# Patient Record
Sex: Male | Born: 1940 | Race: White | Hispanic: No | Marital: Married | Smoking: Former smoker
Health system: Southern US, Community
[De-identification: ages and names within clinical notes are randomized; demographics above are authoritative.]

## PROBLEM LIST (undated history)

## (undated) DIAGNOSIS — I10 Essential (primary) hypertension: Secondary | ICD-10-CM

## (undated) DIAGNOSIS — F419 Anxiety disorder, unspecified: Secondary | ICD-10-CM

## (undated) DIAGNOSIS — G709 Myoneural disorder, unspecified: Secondary | ICD-10-CM

## (undated) DIAGNOSIS — E119 Type 2 diabetes mellitus without complications: Secondary | ICD-10-CM

## (undated) DIAGNOSIS — N200 Calculus of kidney: Secondary | ICD-10-CM

## (undated) DIAGNOSIS — G8929 Other chronic pain: Secondary | ICD-10-CM

## (undated) DIAGNOSIS — M1990 Unspecified osteoarthritis, unspecified site: Secondary | ICD-10-CM

## (undated) DIAGNOSIS — E785 Hyperlipidemia, unspecified: Secondary | ICD-10-CM

## (undated) DIAGNOSIS — J189 Pneumonia, unspecified organism: Secondary | ICD-10-CM

## (undated) DIAGNOSIS — N189 Chronic kidney disease, unspecified: Secondary | ICD-10-CM

## (undated) DIAGNOSIS — I2510 Atherosclerotic heart disease of native coronary artery without angina pectoris: Secondary | ICD-10-CM

## (undated) DIAGNOSIS — I209 Angina pectoris, unspecified: Secondary | ICD-10-CM

## (undated) DIAGNOSIS — R351 Nocturia: Secondary | ICD-10-CM

## (undated) DIAGNOSIS — I219 Acute myocardial infarction, unspecified: Secondary | ICD-10-CM

## (undated) DIAGNOSIS — M549 Dorsalgia, unspecified: Secondary | ICD-10-CM

## (undated) HISTORY — PX: INCISION AND DRAINAGE OF WOUND: SHX1803

## (undated) HISTORY — PX: BACK SURGERY: SHX140

## (undated) HISTORY — PX: JOINT REPLACEMENT: SHX530

## (undated) HISTORY — PX: PARTIAL NEPHRECTOMY: SHX414

## (undated) HISTORY — PX: CORONARY ANGIOPLASTY: SHX604

---

## 1969-04-11 HISTORY — PX: LUMBAR DISC SURGERY: SHX700

## 1979-04-12 HISTORY — PX: ROTATOR CUFF REPAIR: SHX139

## 2001-08-11 DIAGNOSIS — I219 Acute myocardial infarction, unspecified: Secondary | ICD-10-CM

## 2001-08-11 HISTORY — DX: Acute myocardial infarction, unspecified: I21.9

## 2002-10-21 ENCOUNTER — Encounter: Payer: Self-pay | Admitting: Orthopedic Surgery

## 2002-10-21 ENCOUNTER — Encounter: Admission: RE | Admit: 2002-10-21 | Discharge: 2002-10-21 | Payer: Self-pay | Admitting: Orthopedic Surgery

## 2002-10-24 ENCOUNTER — Ambulatory Visit (HOSPITAL_BASED_OUTPATIENT_CLINIC_OR_DEPARTMENT_OTHER): Admission: RE | Admit: 2002-10-24 | Discharge: 2002-10-24 | Payer: Self-pay | Admitting: Orthopedic Surgery

## 2003-02-20 ENCOUNTER — Ambulatory Visit (HOSPITAL_BASED_OUTPATIENT_CLINIC_OR_DEPARTMENT_OTHER): Admission: RE | Admit: 2003-02-20 | Discharge: 2003-02-20 | Payer: Self-pay | Admitting: Orthopedic Surgery

## 2003-02-20 ENCOUNTER — Encounter: Payer: Self-pay | Admitting: Orthopedic Surgery

## 2003-07-21 ENCOUNTER — Inpatient Hospital Stay (HOSPITAL_COMMUNITY): Admission: RE | Admit: 2003-07-21 | Discharge: 2003-07-24 | Payer: Self-pay | Admitting: Neurosurgery

## 2003-08-12 HISTORY — PX: TOTAL KNEE ARTHROPLASTY: SHX125

## 2003-08-12 HISTORY — PX: ANTERIOR FUSION CERVICAL SPINE: SUR626

## 2005-03-04 ENCOUNTER — Ambulatory Visit (HOSPITAL_COMMUNITY): Admission: RE | Admit: 2005-03-04 | Discharge: 2005-03-04 | Payer: Self-pay | Admitting: Orthopedic Surgery

## 2005-07-09 ENCOUNTER — Inpatient Hospital Stay (HOSPITAL_COMMUNITY): Admission: RE | Admit: 2005-07-09 | Discharge: 2005-07-13 | Payer: Self-pay | Admitting: Orthopedic Surgery

## 2006-03-04 ENCOUNTER — Ambulatory Visit: Payer: Self-pay | Admitting: Internal Medicine

## 2006-03-12 ENCOUNTER — Ambulatory Visit: Payer: Self-pay | Admitting: Internal Medicine

## 2006-03-16 ENCOUNTER — Ambulatory Visit: Payer: Self-pay | Admitting: Internal Medicine

## 2010-10-29 ENCOUNTER — Other Ambulatory Visit: Payer: Self-pay | Admitting: Neurosurgery

## 2010-10-29 DIAGNOSIS — M549 Dorsalgia, unspecified: Secondary | ICD-10-CM

## 2010-10-29 DIAGNOSIS — M542 Cervicalgia: Secondary | ICD-10-CM

## 2010-10-30 ENCOUNTER — Ambulatory Visit
Admission: RE | Admit: 2010-10-30 | Discharge: 2010-10-30 | Disposition: A | Discharge status: Home / Self Care | Payer: Medicare Other | Source: Ambulatory Visit | Attending: Neurosurgery | Admitting: Neurosurgery

## 2010-10-30 DIAGNOSIS — M549 Dorsalgia, unspecified: Secondary | ICD-10-CM

## 2010-10-30 DIAGNOSIS — M542 Cervicalgia: Secondary | ICD-10-CM

## 2010-11-05 ENCOUNTER — Other Ambulatory Visit: Payer: Self-pay | Admitting: Neurosurgery

## 2010-11-05 DIAGNOSIS — M542 Cervicalgia: Secondary | ICD-10-CM

## 2010-11-05 DIAGNOSIS — M4800 Spinal stenosis, site unspecified: Secondary | ICD-10-CM

## 2010-11-05 DIAGNOSIS — M5410 Radiculopathy, site unspecified: Secondary | ICD-10-CM

## 2010-11-06 ENCOUNTER — Ambulatory Visit
Admission: RE | Admit: 2010-11-06 | Discharge: 2010-11-06 | Disposition: A | Discharge status: Home / Self Care | Payer: Medicare Other | Source: Ambulatory Visit | Attending: Neurosurgery | Admitting: Neurosurgery

## 2010-11-06 DIAGNOSIS — M5410 Radiculopathy, site unspecified: Secondary | ICD-10-CM

## 2010-11-06 DIAGNOSIS — M4800 Spinal stenosis, site unspecified: Secondary | ICD-10-CM

## 2010-11-06 DIAGNOSIS — M542 Cervicalgia: Secondary | ICD-10-CM

## 2010-11-29 ENCOUNTER — Other Ambulatory Visit: Payer: Self-pay | Admitting: Neurosurgery

## 2010-11-29 DIAGNOSIS — M542 Cervicalgia: Secondary | ICD-10-CM

## 2010-11-29 DIAGNOSIS — M4800 Spinal stenosis, site unspecified: Secondary | ICD-10-CM

## 2010-11-29 DIAGNOSIS — M5410 Radiculopathy, site unspecified: Secondary | ICD-10-CM

## 2010-12-02 ENCOUNTER — Ambulatory Visit
Admission: RE | Admit: 2010-12-02 | Discharge: 2010-12-02 | Disposition: A | Discharge status: Home / Self Care | Payer: Medicare Other | Source: Ambulatory Visit | Attending: Neurosurgery | Admitting: Neurosurgery

## 2010-12-02 DIAGNOSIS — M542 Cervicalgia: Secondary | ICD-10-CM

## 2010-12-02 DIAGNOSIS — M4800 Spinal stenosis, site unspecified: Secondary | ICD-10-CM

## 2010-12-02 DIAGNOSIS — M5410 Radiculopathy, site unspecified: Secondary | ICD-10-CM

## 2010-12-27 NOTE — Op Note (Signed)
NAME:  Christopher [name], Christopher [name]                           ACCOUNT NO.:  [id number]   MEDICAL RECORD NO.:  [id number]                   PATIENT TYPE:  AMB   LOCATION:  DSC                                  FACILITY:  [facility]   PHYSICIAN:  Feliberto Gottron. Turner Daniels, M.D.                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  Cartilaginous loose bodies.   PROCEDURE:  Left knee partial arthroscopic medial meniscectomy, debridement  of grade 3 to grade 4 focal chondromalacia from the medial femoral condyle  and medial tibial condyle and removal of cartilaginous loose bodies x many.   SURGEON:  Feliberto Gottron. Turner Daniels, M.D.   FIRST ASSISTANT:  Skip Mayer, P.A.-C.   ANESTHESIA:  General LMA.   ESTIMATED BLOOD LOSS:  Minimal.   FLUIDS REPLACED:  800 mL of crystalloid.   DRAINS:  None.   TOURNIQUET TIME:  None.   INDICATIONS FOR PROCEDURE:  A 70 year old man with MRI proven left knee  medial meniscal tear catching popping and pain along the posterior medial  aspect of the left knee which failed conservative treatment,  antiinflammatory medicine, observation, and exercise. He was taken for  arthroscopic evaluation and treatment of his left knee.   DESCRIPTION OF PROCEDURE:  The patient identified by armband, taken to the  operating room at [facility], appropriate anesthetic monitors  were attached and general LMA anesthesia induced with the patient in a  supine position. Lateral posts applied to the table and left lower extremity  prepped and draped in the usual sterile fashion from the ankle to the mid  thigh. Using a #11 blade, standard inferomedial and inferolateral  parapatellar portals were then made allowing introduction of the arthroscope  through the inferolateral portal and the outflow through the inferomedial  portal. The trochlea and patella were in good condition, the patella had may  be grade 1 chondromalacia on the medial side, grade 3 to grade 4 focal  chondromalacia with flap tears was debrided back to a stable margin. The  medial meniscus had complex degenerative and acute tearing of the medial and  posterior horns and these were also debrided back to stable margins using  the basket, straight biters and the 3.5 gator sucker shaver. We encountered  multiple cartilaginous loose bodies, some were removed with the arthroscopic  graspers, some were removed with the outflow. The ACL and the PCL were  intact. The lateral compartment was in excellent condition and some  incidental degenerative tearing of the lateral meniscus was lightly  debrided. The gutters were clear, the scope was taken medial to the PCL  clearing the posterior compartment. The knee was then washed out with  normal  saline solution, the arthroscopic instruments removed. A dressing of  Xeroform 4 x 4 dressings, sponges, Webril and an Ace wrap applied. The  patient was then awakened and taken to the recovery room without difficulty.                                                Feliberto Gottron. Turner Daniels, M.D.    Ovid Curd  D:  10/24/2002  T:  10/24/2002  Job:  [id number]

## 2010-12-27 NOTE — Assessment & Plan Note (Signed)
[facility]                     [facility] OFFICE NOTE   KINGSTEN, ENFIELD                        MRN:          [id number]  DATE:03/04/2006                            DOB:          [birth year]    REASON FOR CONSULTATION:  Abnormal LFTs, screening colonoscopy.   ASSESSMENT:  [age]-year-old white man with obesity, diabetes, and  presumably metabolic syndrome.  He has abnormal transaminases which are most  likely due to nonalcoholic steatohepatitis or just fatty liver itself  without hepatitis.  They are one and a half to two times abnormal.  He is  also of an appropriate age for colon cancer screening and is average risk.   RECOMMENDATIONS AND PLAN:  1.  Regarding the abnormal LFTs, will check iron saturation, a ferritin, an      ANA, hepatitis B surface antigen, and hepatitis C antibody.  2.  Ultrasound of the liver.  3.  Pending that, I suspect he can start a statin.  Recent data indicates      that statins are extremely safe in liver disease and, in his case, if we      are comfortable with the idea that it is fatty liver and not other      problems it may even help his fatty liver.  He is currently on Lopid      which is okay, but if a statin is indicated, I would use it assuming      what I said above is true.  Further recommendations to follow.  4.  A screening colonoscopy will be scheduled.  Will hold his Plavix for      seven days.  We will hold his diabetic medications the morning of.  We      will continue his aspirin and Celebrex.   Risks, benefits, and indications of colonoscopy, including the risks of  coronary or other blood vessel event off of Plavix are discussed with the  patient.  He agrees to proceed.  This is scheduled for August 2.  His  ultrasound is scheduled for July 27.   HISTORY:  Pleasant 70 year old white man who was getting routine followup  and found to have these abnormal LFTs as described.   He also has total  cholesterol of 235, triglycerides 271, HDL 38, VLDL 54, LDL 153.  His  glucose was 108 at that time.  I do not know if that was fasting.  His TSH  is normal.  Bilirubin and alkaline phosphatase are normal.  He drinks about  a six-pack a month maybe.  There is no known history of liver disease in the  family.  He does have diabetes mellitus type 2 and neuropathy.  He has not  had endoscopic screening for colon cancer.  He has no signs of bleeding.  He  has some mild dysphagia related to his C-spine fusion which he has adapted  to and is clearly related to that.  There is occasional heartburn once or  twice a week at times but he had go a month without.  He uses antacids with  relief.  There is occasional  pain in the epigastric area, usually related to  eating raw vegetables.   MEDICATIONS:  1.  Plavix 75 mg daily.  2.  Toprol XL 50 mg daily.  3.  Glucotrol 10 mg daily.  4.  Avandia 4 mg daily.  5.  Metformin 500 mg daily.  6.  Accupril 40 mg daily.  7.  Neurontin 600 mg t.i.d.  8.  Baby aspirin one each day.  9.  Celebrex 200 mg each day.  10. Lopid 600 mg b.i.d.   ALLERGIES:  No known drug allergies.  He has been sensitive to Lipitor with  muscle pains and myalgias.   PAST MEDICAL HISTORY:  1.  Hypertension.  2.  Obesity.  3.  Myocardial infarction without stenting April 13, 2002, at [facility].      He has been on Plavix since.  Apparently, had two insignificant      blockages.  4.  Type 2 diabetes mellitus.  5.  Dyslipidemia.  6.  Osteoarthritis.  7.  Nephrolithiasis x 3.  8.  Left and right shoulder surgeries.  9.  C-spine fusion.  10. Neuropathy, diabetic.   FAMILY HISTORY:  Heart disease in his father and two brothers.  Diabetes in  a brother.   SOCIAL HISTORY:  He is married.  He is on disability.  He has been a Psychologist, occupational  and a Psychologist, occupational.  He fishes quite a bit now.  Alcohol as above.  No tobacco  or drugs.   REVIEW OF SYSTEMS:  See my medical  history for full details of that.   PHYSICAL EXAMINATION:  GENERAL:  Reveals a pleasant, well-developed, obese  white man.  VITAL SIGNS:  Height 5 feet 10, weight 225 pounds.  Blood pressure 140/83.  Pulse 58.  Body mass index is 32.2 kg/meter sq which is an obesity 1 class.  HEENT:  Eyes anicteric.  ENT shows upper dentures.  Missing most of his  lower teeth.  He has his lower incisors and canine teeth.  Mouth:  Posterior  pharynx free of lesions.  NECK:  Supple.  No mass or thyromegaly.  CHEST:  Clear and resonant.  HEART:  S1 and S2.  No rubs, murmurs, or gallops.  No JVD.  EXTREMITIES:  Show no peripheral edema.  ABDOMEN:  Obese, nontender without hepatosplenomegaly or mass detected.  RECTAL:  Deferred at this time.  LYMPHATIC:  No neck, supraclavicular nodes.  SKIN:  He has multiple surgical scars in the neck and the left arm area.  PSYCH:  He is alert and oriented x 3.   DATA REVIEWED:  Office notes, laboratory tests kindly sent by Ms. Andria Meuse at  [facility].   Regarding his obesity, we did discuss reducing portion sizes, eliminating  eating when not hungry, and exercise.  Weight Watchers was briefly  suggested.  Certainly, increased fiber intake would be good for diabetes.  A  1200-kilocalorie-per-day diet would be appropriate to try to lose weight.  This can be discussed further.   I appreciate the opportunity to care for this patient.                                   Iva Boop, MD, [facility]   CEG/MedQ  DD:  03/04/2006  DT:  03/04/2006  Job #:  [id number]   cc:   Justice Britain, PA-C

## 2010-12-27 NOTE — Discharge Summary (Signed)
Christopher [name], Christopher [name]                 ACCOUNT NO.:  [id number]   MEDICAL RECORD NO.:  [id number]          PATIENT TYPE:  INP   LOCATION:  5031                         FACILITY:  [facility]   PHYSICIAN:  Feliberto Gottron. Turner Daniels, M.D.   DATE OF BIRTH:  12/16/1940   DATE OF ADMISSION:  07/09/2005  DATE OF DISCHARGE:  07/13/2005                                 DISCHARGE SUMMARY   PRIMARY DIAGNOSIS:  End-stage arthritis of the left knee.   PROCEDURE WHILE IN HOSPITAL:  Left total knee arthroplasty.   HISTORY OF PRESENT ILLNESS:  The patient is a 70 year old gentleman with  known end-stage arthritis of the left knee who has failed conservative  treatment with anti-inflammatory medications, physical therapy, cortisone  injections and has bone-on-bone arthritic changes by x-ray.  The patient  desires elective total knee arthroplasty to decrease pain and increase  function.  Risks and benefits of surgery were discussed with him in advance  and all of the questions were answered.   ALLERGIES:  The patient has no known drug allergies.   MEDICATIONS AT TIME OF ADMISSION:  Accupril, Plavix, Metformin, Glucotrol,  Neurontin, Toprol and Avandia.   MEDICAL ILLNESSES:  1.  Usual childhood diseases.  2.  Adult history of diabetes, hypertension, MI in 2003, kidney stones and      DJD.   SURGICAL HISTORY:  1.  Right shoulder scoped in 2004, left shoulder in the 1990s.  2.  Left knee scoped.   SOCIAL HISTORY:  No tobacco.  Occasional ethanol.  No IV drug abuse.  He is  married.   FAMILY HISTORY:  Mother died at age 72 with a history of CVA; father died at  14 with a history of MI.   REVIEW OF SYSTEMS:  Positive for glasses, difficulty swallowing, upper  dentures.  Denies any chest pain, shortness of breath or recent illness.   PHYSICAL EXAMINATION:  VITAL SIGNS:  Temperature 97.9 degrees, pulse 88,  respirations 20, blood pressure 138/80.  GENERAL:  The patient is a 5-foot 10-inch, 220-pound male.  HEENT:   Head is normocephalic, atraumatic.  Ears and TMs are clear.  Eyes:  Pupils are equal, round and reactive to light.  Throat benign.  NECK:  Supple.  CHEST:  Clear to auscultation and percussion.  HEART:  Regular rate and rhythm.  ABDOMEN:  Soft and nontender.  EXTREMITIES:  Left knee range of motion 10-120 degrees, 5 degrees of varus  deformity, otherwise neurovascularly intact.  SKIN:  Intact.   LABORATORY AND ACCESSORY CLINICAL DATA:  X-ray shows bone-on-bone of the  medial compartment.   Preoperative labs including CBC, CMET, chest x-ray, EKG and PTT are within  normal limits with the exception of glucose of 147 and liver function tests  including AST of 62, ALT of 83 and ALP of 129.  EKG showed possible old  ASMI.   HOSPITAL COURSE:  On the day of admission, the patient was taken to [facility]  operating room, where he underwent a left total knee arthroplasty using  DePuy Sigma RP components as well as cemented #4 femur, #4 tibia  and 38-mm  patella and 10-mm spacer.  There was a small anterior wedge-type crack noted  at the proximal tibia at the edge of the base; this was affixed with a  single 4.5 fully threaded cortical screw.  Foley catheter was placed  preoperatively and a medium Hemovac was placed, double-armed, into the  wound.  The patient was placed on perioperative antibiotics.  He was placed  on postoperative Coumadin prophylaxis per pharmacy protocol.  He was also  placed on PCA pain control with  Dilaudid.  Physical therapy was begun in  the recovery room using CPM.  Postoperative day 1, the patient was awake and  alert, no vomiting.  Vital signs were stable.  He was afebrile.  Drain  output was less than 50 mL and discontinued.  Dressing was dry.  He was  neurovascularly intact.  INR 1.1.  Foley output had been 400 mL the previous  shift.  Hemoglobin 11.9, WBC 8.3.  He continued to be weightbearing as  tolerated.  Postoperative day 2, the patient was without complaint,  T-max  100, ranging to 98 degrees, hemoglobin 11.4, WBC of 10.5, CBGs running from  56-160, INR 1.3.  Dressing was dry.  Calf was soft.  Physical therapy was  continued.  It was hopeful that he would be discharged home the following  day.  Postoperative day 3, the patient's temperature max was 100.  He was  currently afebrile at the time of their exam.  The patient was complaining  of left medial malleolar pain with ambulation, otherwise doing well.  The  wound was clean and dry.  Venous Doppler was obtained to rule out DVT and  TED hose were placed bilaterally.  Postoperative day 4, the patient was  afebrile.  Vital signs were stable.  His Doppler was negative for any type  of clot and he was discharged home to the care of his family.   DISCHARGE MEDICATIONS:  Prescriptions include Percocet, Phenergan and  Coumadin.  He will restart his Plavix, Toprol, Glucotrol, Avandia,  metformin, Accupril, Neurontin and we will have home health following him  for his Coumadin labs.   FOLLOWUP:  He should follow up with Dr. Turner Daniels in 7-10 days for suture  removal.   DIET:  ADA, regular.   ACTIVITY:  Weightbearing as tolerated with a walker.      Laural Benes. Jannet Mantis.      Feliberto Gottron. Turner Daniels, M.D.  Electronically Signed   JBR/MEDQ  D:  09/15/2005  T:  09/16/2005  Job:  [id number]

## 2010-12-27 NOTE — Op Note (Signed)
NAME:  Christopher [name], Christopher [name]                           ACCOUNT NO.:  [id number]   MEDICAL RECORD NO.:  [id number]                   PATIENT TYPE:  INP   LOCATION:  3008                                 FACILITY:  [facility]   PHYSICIAN:  Hilda Lias, M.D.                DATE OF BIRTH:  [birth year]   DATE OF PROCEDURE:  07/21/2003  DATE OF DISCHARGE:                                 OPERATIVE REPORT   PREOPERATIVE DIAGNOSIS:  Cervical stenosis at 4-5, 5-6, and 6-7 with  cervical myelopathy, degenerative disk disease, status post trauma to the  cervical cord eight months ago.   POSTOPERATIVE DIAGNOSIS:  Cervical stenosis at 4-5, 5-6, and 6-7 with  cervical myelopathy, degenerative disk disease, status post trauma to the  cervical cord eight months ago.   PROCEDURE:  Anterior 4-5, 5-6, and 6-7 diskectomy, decompression of the  spinal cord, bilateral foraminotomies, interbody fusion with allograft,  plate from C4 to C7, microscope.   SURGEON:  Hilda Lias, M.D.   ASSISTANT:  Danae Orleans. Venetia Maxon, M.D.   INDICATIONS FOR PROCEDURE:  The patient was admitted because of neck pain  with radiation to both upper extremities associated with weakness and  incoordination.  The patient had a trauma to the neck back in February and  since then he has been complaining of pain, weakness, and numbness.  X-ray  shows no change in the spinal cord as well as stenosis at the level of 4-5,  5-6, and 6-7.  Surgery was advised.   DESCRIPTION OF PROCEDURE:  The patient was taken to the OR and after  intubation, the neck was prepped with Betadine.  Longitudinal incision on  the left side through the skin and platysma was carried down.  There were  large osteophytes at 4-5, 5-6, and 6-7.  The x-ray showed that we were at  the level of 4-5.  With the Leksell, we removed the osteophytes from 4-5, 5-  6, and 6-7. We entered the disk space and for this we had to use the drill.  The patient has almost no space secondary to  severe degenerative disk  disease.  After we entered the disk space 4-5 with the drill, we removed the  posterior ridge.  We opened the posterior ligament which was calcified and  we decompressed the spinal cord to the left of the C5 nerve root.  The same  procedure was done also from the same steps at the level of 6-7.  Then three  pieces of bone graft of 7 mm height were inserted.  This was followed by a  plate using AV screws. The lateral C-spine showed good position of the bone  graft and the plate in the upper part. The lower part was difficult to see.  Then the area was irrigated. Hemostasis was done with bipolar. A Al Pimple drain was left. The wound was closed with Vicryl and Steri-Strips.  The patient is going to the recovery room.                                               Hilda Lias, M.D.    EB/MEDQ  D:  07/21/2003  T:  07/22/2003  Job:  [id number]

## 2010-12-27 NOTE — Letter (Signed)
March 04, 2006     Justice Britain, PA-C  [facility]  [address]  [place], [place] [address]   RE:  Christopher [name], Christopher [name]  MRN:  [id number]  /  DOB:  Jun 30, 1941   Dear Ms. Andria Meuse:   Thank you for kind referral of Mr. Christopher [name] (date of birth -- August 20, 1940).  I saw him today and have scheduled him for a screening colonoscopy  on March 12, 2006.  I am also ordering an ultrasound and some additional  serologic workup of his abnormal liver chemistries.   I suspect the abnormal liver chemistries are related to fatty liver, as you  have, and his metabolic syndrome.  As you will see outlined in my  consultation report, which should accompany or follow this letter, despite  the concerns about abnormal transaminases and statins, they are actually a  very good drug in this setting.  Once I have completed the workup, I will  make further recommendations regarding that.   Again, I appreciate the opportunity to help care for your patients.    Sincerely,      Iva Boop, MD, [facility]   CEG/MedQ  DD:  03/04/2006  DT:  03/04/2006  Job #:  [id number]

## 2010-12-27 NOTE — Op Note (Signed)
Christopher [name], Christopher [name]                 ACCOUNT NO.:  [id number]   MEDICAL RECORD NO.:  [id number]          PATIENT TYPE:  INP   LOCATION:  5031                         FACILITY:  [facility]   PHYSICIAN:  Feliberto Gottron. Turner Daniels, M.D.   DATE OF BIRTH:  October 26, 1940   DATE OF PROCEDURE:  07/09/2005  DATE OF DISCHARGE:                                 OPERATIVE REPORT   PREOPERATIVE DIAGNOSIS:  End-stage arthritis, left knee.   POSTOPERATIVE DIAGNOSIS:  End-stage arthritis, left knee.   PROCEDURE:  Left total knee arthroplasty using DePuy Sigma RP components;  all cemented.  (#4 femur, #4 tibia,  38 mm patella and  10 mm spacer). There  was also a small anterior wedge-type crack noted in the proximal tibia at  the end of the case. This was fixed with a single 4.5 fully threaded  cortical screw, as this man had very hard bone going from medial to lateral.  The crack was anterior, propagating down about 1.5 cm and then stopped.   SURGEON:  Feliberto Gottron. Turner Daniels, M.D.   FIRST ASSISTANT:  Erskine Squibb B. Su Hilt, P.A.-C.   ANESTHETIC:  Endotracheal.   ESTIMATED BLOOD LOSS:  Minimal.   FLUID REPLACEMENT:  A liter of crystalloid.   DRAINS PLACED:  Foley catheter and two medium Hemovacs.   TOURNIQUET TIME:  1 hour 40 minutes.   INDICATIONS FOR PROCEDURE:  The patient is a 70 year old gentleman with  known end-stage arthritis of the left knee, who was failed conservative  treatment with anti-inflammatory medicines, physical therapy, cortisone  injections and has bone-on-bone arthritic changes by x-ray.  He desires  elective left total knee arthroplasty to decrease pain and increase  function. The risks and benefits of surgery discussed with him in advance  and all questions answered.   DESCRIPTION OF PROCEDURE:  The patient identified by armband and taken to  the operating room at [facility], where the appropriate  anesthetic monitors were attached and general endotracheal anesthesia  induced with the  patient in the supine position. Tourniquet was applied high  to the left thigh.  Foot positioner and lateral post applied to the table,  and the left lower extremity prepped and draped in the usual sterile fashion  from the ankle to the mid thigh.  The limb was wrapped with an Esmarch  bandage, tourniquet inflated to 350 mmHg.   We began the procedure by making an anterior midline incision, starting one  handbreadth above the patella, going over the patella and just distal to the  medial tibial tubercle.  Small bleeders and skin and subcutaneous tissue  identified and cauterized. The transverse retinaculum over the patella was  incised and reflected medially, allowing a medial parapatellar arthrotomy.  The patella was everted.  The prepatellar fat pad was resected inferiorly,  and the superficial medial collateral ligament elevated off the tibia going  from anterior to posterior, going around pretty much the posteromedial  quadrant and leaving intact distally -- stripping off the proximal tibia  over a distance of about 2-3 cm.  The knee was then hyperflexed, exposing  the anterior half of the menisci, which were then resected with  electrocautery as were the cruciate ligaments. The  Z-retractor was placed  posteromedially and the KL through the notch and a Homan laterally,  enhancing our proximal tibial exposure. The proximal tibia was then entered  with the step drill from the DePuy set, followed by an intramedullary rod  and a 0-degree posterior slope cutting guide -- allowing resection of 6-7 mm  of bone medially and 9-10 mm of bone laterally (counting the thickness of  the blade).  We then entered the distal femur with the step drill, 2 mm  anterior to the PCL origin; and with the cutting guide set at 5 degrees  left, resected the distal 11 mm of the femur (because the patient had about  a  5-degree flexion contracture).  We sized for a #4 femoral component;  externally rotated the  cutting guide 3 degrees and placed the pins.  The #4  cutting guide was then brought up and the anterior, posterior and chamfer  cuts accomplished without difficulty, followed by the box cutting guide and  the box cutting cuts made without difficulty. The patient's bone was noted  to be extremely hard and high-quality on the proximal tibia and the distal  femur. The everted patella was then measured at 23 mm.  The cutting guide  was set 13 mm,  allowing a resection of 10 mm of bone.  Then the posterior  10 mm of the patella resected, sized to a #38 patellar button and drilled.  At this point, the knee was once again hyperflexed, the proximal tibia  exposed.  We sized for #4 tibial baseplate; reverse reamed the cone through  the smokestack, and did our Delta-fit keel punch through this as well.  A  trial #4 left femoral component was hammered into place.  A 10-mm trial  spacer and a 38 mm patellar button were placed.  The knee was taken through  a range of motion and noticed to have an excellent range of motion with no  flexion contracture.   At this point the trial components were removed.  All bony surfaces were  water-picked clean, and dried with suction and sponges.  On the back table a  double batch of DePuy medium viscosity cement was then mixed with 1500 mg of  __________; applied to all bony and metallic mating surfaces, except for the  posterior condyles of the femur.  Then in order we hammered into place a #4  tibial baseplate and removed excess cement, a #4 left femur and removed  excess cement, and squeezed in place a 38 mm patellar button. The 10 mm RP  spacer was then snapped into place, the knee held in full extension and then  the cement allowed to cure. At this time anteriorly, just to the medial side  of the tibial tubercle of the baseplate, a small vertical crack was noted propagating down about a centimeter to 1.5 cm.  After the cement had  hardened,  the knee was  stressed in varus and valgus.  The crack was not  noted to move or propagate any further.  Then a 4.5 cortical lag screw going  from medial to lateral anterior to the keel was then screwed into place,  giving supplemental fixation to this crack (which did not appear to be  structurally compromised).  An anterior AP x-ray was accomplished. There was  no evidence of the crack on the x-ray with a screw  in place. The knee was  once again taken through range of motion and stability noted to be  excellent.  Medium Hemovac drains were placed deep in the wound, and after  one more pass of pulse lavage irrigation the parapatellar arthrotomy closed  with running #1 Vicryl suture. The subcutaneous tissue closed with 0 and 2-0  undyed Vicryl suture, and the skin with skin staples. A dressing of Xeroform  4x4 dressing sponges, Webril and an Ace wrap applied.  The tourniquet was  let down. The patient awakened and taken to the recovery room without  difficulty.      Feliberto Gottron. Turner Daniels, M.D.  Electronically Signed     FJR/MEDQ  D:  07/09/2005  T:  07/10/2005  Job:  [id number]

## 2010-12-27 NOTE — Procedures (Signed)
[facility]                            ABDOMINAL ULTRASOUND REPORT   PLUMMER, MATICH                        MRN:          [id number]  DATE:03/16/2006                            DOB:          03-26-1941    ACCESSION NUMBER:  [id number].   READING PHYSICIAN:  Lina Sar, MD   INDICATIONS FOR PROCEDURE:  Abnormal liver function tests.   PROCEDURE:  Upper abdominal ultrasound.   RESULTS:  Abdominal aorta is 16 mm.  The abdominal aorta appears normal.  The IVC is patent.   The pancreas appears normal throughout the head, body and tail without  evidence of ductal dilatation, pancreatic masses or peripancreatic  inflammation.   Gallbladder is well distended, thin walled, with no pericholecystic fluid or  intraluminal echogenic foci to suggest gallstone disease.   The common bile duct measures 5.4 mm in maximal diameter and appears normal  without evidence of intraluminal foci.   The liver is with mild to moderate increased echodensity throughout the  liver without focal lesions or hepatomegaly.  No dilated intrahepatic ducts.   Kidneys measure 107 mm right, 126 mm left.  Both kidneys are normal in  appearance.   The spleen measuring 126 mm, upper limits of normal without parenchymal  lesion.   IMPRESSION:  1.  Mild to moderate increase in liver echodensity with mild splenomegaly,      suggestive of chronic liver disease.  No focal lesions.  2.  Normal gallbladder.                                   Hedwig Morton. Juanda Chance, MD   DMB/MedQ  DD:  03/16/2006  DT:  03/17/2006  Job #:  [id number]

## 2010-12-27 NOTE — Discharge Summary (Signed)
Christopher [name], Christopher [name]                           ACCOUNT NO.:  [id number]   MEDICAL RECORD NO.:  [id number]                   PATIENT TYPE:  INP   LOCATION:  3008                                 FACILITY:  [facility]   PHYSICIAN:  Hilda Lias, M.D.                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   HISTORY OF PRESENT ILLNESS:  The patient was admitted because of weakness,  hyperesthesia and hyperreflexia.  The patient had hyperextension injury back  in February.  Clinically, he had weakness of deltoids, biceps and triceps.  MRI showed some changes in the spinal cord with stenosis at three levels.  Surgery was advised.   LABORATORY DATA:  Normal.   HOSPITAL COURSE:  The patient was taken to surgery and anterior cervical  discectomy at the level of 4-5, 5-6, and 6-7 was done.  After surgery, the  patient did well.  He had been on Plavix.  Because of this, we put a drain  down __________.  Today he is feeling much better and he is ready to go  home.   CONDITION ON DISCHARGE:  Improved.   MEDICATIONS:  Percocet and Flexeril.   DIET:  Regular.   ACTIVITY:  Not to drive for at least a week.   FOLLOW UP:  See me in four weeks.                                                Hilda Lias, M.D.    EB/MEDQ  D:  07/24/2003  T:  07/24/2003  Job:  [id number]

## 2010-12-27 NOTE — H&P (Signed)
NAME:  Christopher [name], Christopher [name]                           ACCOUNT NO.:  [id number]   MEDICAL RECORD NO.:  [id number]                   PATIENT TYPE:  INP   LOCATION:  3008                                 FACILITY:  [facility]   PHYSICIAN:  Hilda Lias, M.D.                DATE OF BIRTH:  1940/12/26   DATE OF ADMISSION:  07/21/2003  DATE OF DISCHARGE:                                HISTORY & PHYSICAL   HISTORY OF PRESENT ILLNESS:  Mr. Sturges is a gentleman who came to see me in  my office several days ago because of neck pain with weakness of both hands.  It seems that he had an accident several months ago where he hit his chin  and from then on, he developed hyperextension and burning sensation in both  upper extremities.  The weakness is getting worse, as well as the burning  sensation.  Since then, he has developed some problem with his gait.  He is  quite miserable.  He feels that the weakness is getting worse.  The patient  had an MRI and sent to [place] for evaluation.   PAST MEDICAL HISTORY:  Surgery on the knee and the right shoulder.   ALLERGIES:  He is not allergic to any medications.   SOCIAL HISTORY:  He drinks socially.  He does not smoke.   FAMILY HISTORY:  Mother died of a stroke when she was 36.   REVIEW OF SYSTEMS:  Review of systems is positive for high blood pressure,  kidney stones, neck pain, weakness of the arms and legs.   PHYSICAL EXAMINATION:  HEENT:  Head, eyes, ears, nose and throat normal.  NECK:  He is able to flex but lateralization causes him discomfort.  LUNGS:  Lungs clear.  HEART:  Heart sounds normal.  ABDOMEN:  Abdomen normal.  EXTREMITIES:  Normal pulses.  NEUROLOGIC:  Mental status normal.  Cranial nerves normal.  Strength:  I can  break easily both deltoids, both biceps, both wrist extensors, with some  weakening of the triceps.  In the lower extremities, she has weakness on  dorsiflexion of both feet.  Sensory:  She complains of a burning sensation  which  involves mostly the nerves #5 and 6.  Reflexes 0+ in the biceps, 0+ in  the triceps.  Hyperreflexia with Babinski on the right side.  Coordination  normal.   IMAGING STUDIES:  The MRI showed that indeed he has a stenosis at the level  of 4-5, 5-6 and 6-7 with some question of gliosis at the levels of 3-4 and 4-  5.   IMPRESSION:  1. Cervical stenosis secondary to degenerative disk disease with a cervical     myelopathy.  2. Gliosis at the level of 4-5 and probably 3-4.   RECOMMENDATIONS:  I talked to him and his wife at length.  We talked about  conservative treatment versus surgery.  At the  end, we agreed with surgery  and the procedure will be a three-level anterior cervical diskectomy.  We  did nerve conduction tests which showed that indeed he has bilateral carpal  tunnel syndrome.  The procedure  will be a three-level anterior cervical diskectomy followed by fusion with  allograft plating.  He knows about the risks such as infection, CSF, no  improvement whatsoever, worsening of the weakness, need for further surgery  and failure of the plate.                                                Hilda Lias, M.D.    EB/MEDQ  D:  07/21/2003  T:  07/22/2003  Job:  [id number]

## 2010-12-27 NOTE — Op Note (Signed)
NAME:  Christopher [name], Christopher [name]                           ACCOUNT NO.:  [id number]   MEDICAL RECORD NO.:  [id number]                   PATIENT TYPE:  AMB   LOCATION:  DSC                                  FACILITY:  [facility]   PHYSICIAN:  Feliberto Gottron. Turner Daniels, M.D.                DATE OF BIRTH:  01-22-1941   DATE OF PROCEDURE:  02/20/2003  DATE OF DISCHARGE:                                 OPERATIVE REPORT   PREOPERATIVE DIAGNOSES:  Right shoulder massive rotator cuff tear with  impingement.   POSTOPERATIVE DIAGNOSES:  Right shoulder massive rotator cuff tear with  impingement.   OPERATION PERFORMED:  Right shoulder arthroscopic anterior inferior  acromioplasty, distal clavicle spur excision and debridement of massive  rotator cuff tear with a greater tuberoplasty also.   SURGEON:  Feliberto Gottron. Turner Daniels, M.D.   ASSISTANTLaural Benes. Jannet Mantis.   ANESTHESIA:  Interscalene block plus general endotracheal.   ESTIMATED BLOOD LOSS:  Minimal.   FLUIDS REPLACED:  1L of crystalloid.   DRAINS:  None.   TOURNIQUET TIME:  None.   INDICATIONS FOR PROCEDURE:  The patient is a 70 year old man injured at work  sustaining what sounds like a recurrent right shoulder rotator cuff tear.  The cuff has been previously repaired using Mitek anchor some years ago.  MRI scan taken after this injury showed a massive cuff tear with fat  atrophy, possibly some of the tear was pre-existing.  In any event, he is  now symptomatic and has significant pain that has not responded to  conservative treatment.  Desires elective arthroscopic evaluation and  treatment of his right shoulder.   DESCRIPTION OF PROCEDURE:  The patient was identified by arm band and taken  to the operating room at [facility] where appropriate  anesthetic monitors are attached and interscalene block anesthesia induced  into the right upper extremity.  This was followed by general endotracheal  anesthesia.  The patient was placed in the  beach chair position.  The right  upper extremity was prepped and draped in the usual sterile fashion from the  wrist to the hemithorax.  We then made standard portals 1.5 cm anterior to  [facility] joint, lateral to the junction of the middle and posterior third of the  acromion, posterior to the posterolateral corner of the acromion process.  The arthroscope was introduced through the lateral portal, the inflow to the  anterior portal and a 4.2 great white sucker shaver through the posterior  portal.  We immediately encountered a massive rotator cuff tear, retracted  beyond the glenoid rim  as well as remnants of the cuff still remaining on  the greater tuberosity.  This was debrided back to stable margins and a  greater tuberoplasty also performed.  The humeral head and glenoid actually  had pretty good articular cartilage with minimal grade 2 to grade 3  chondromalacia  that was very focal.  There was a very large subacromial and  subclavicular spur that were removed with a  4.5 mm hooded Vortex bur and  the anterior acromial spur was actually huge, about 15 mm in length that  either regrew or was a residual spur from the original operation.  The  biceps tendon was also debrided, having been torn about 30 to 40% near its  anchor on the glenoid.  After completing the debridement and removal of the  spurs, the shoulder was washed out with normal saline solution.  The  arthroscopic instruments were removed and a dressing of Xeroform, 4 x 4  dressing sponges and paper tape applied.  Patient awakened and taken to the  recovery room without difficulty.                                                  Feliberto Gottron. Turner Daniels, M.D.    Ovid Curd  D:  02/20/2003  T:  02/20/2003  Job:  [id number]

## 2011-01-10 ENCOUNTER — Other Ambulatory Visit: Payer: Self-pay | Admitting: Neurosurgery

## 2011-01-10 DIAGNOSIS — M542 Cervicalgia: Secondary | ICD-10-CM

## 2011-01-10 DIAGNOSIS — M5410 Radiculopathy, site unspecified: Secondary | ICD-10-CM

## 2011-01-10 DIAGNOSIS — M4800 Spinal stenosis, site unspecified: Secondary | ICD-10-CM

## 2011-01-13 ENCOUNTER — Other Ambulatory Visit: Payer: Medicare Other

## 2011-01-16 ENCOUNTER — Ambulatory Visit
Admission: RE | Admit: 2011-01-16 | Discharge: 2011-01-16 | Disposition: A | Discharge status: Home / Self Care | Payer: Medicare Other | Source: Ambulatory Visit | Attending: Neurosurgery | Admitting: Neurosurgery

## 2011-01-16 ENCOUNTER — Other Ambulatory Visit: Payer: Medicare Other

## 2011-01-16 DIAGNOSIS — M542 Cervicalgia: Secondary | ICD-10-CM

## 2011-01-16 DIAGNOSIS — M4800 Spinal stenosis, site unspecified: Secondary | ICD-10-CM

## 2011-01-16 DIAGNOSIS — M5410 Radiculopathy, site unspecified: Secondary | ICD-10-CM

## 2011-05-30 ENCOUNTER — Other Ambulatory Visit: Payer: Self-pay | Admitting: Neurosurgery

## 2011-05-30 ENCOUNTER — Ambulatory Visit
Admission: RE | Admit: 2011-05-30 | Discharge: 2011-05-30 | Disposition: A | Discharge status: Home / Self Care | Payer: Medicare Other | Source: Ambulatory Visit | Attending: Neurosurgery | Admitting: Neurosurgery

## 2011-05-30 DIAGNOSIS — M549 Dorsalgia, unspecified: Secondary | ICD-10-CM

## 2011-05-30 MED ORDER — IOHEXOL 300 MG/ML  SOLN
10.0000 mL | Freq: Once | INTRAMUSCULAR | Status: AC | PRN
  Administered 2011-05-30: 10 mL via INTRATHECAL

## 2011-05-30 MED ORDER — ONDANSETRON HCL 4 MG/2ML IJ SOLN: 4.0000 mg | Freq: Four times a day (QID) | INTRAMUSCULAR | Status: DC | PRN

## 2011-05-30 MED ORDER — DIAZEPAM 5 MG PO TABS
5.0000 mg | ORAL_TABLET | Freq: Once | ORAL | Status: AC
  Administered 2011-05-30: 5 mg via ORAL

## 2011-06-10 ENCOUNTER — Encounter (HOSPITAL_COMMUNITY)
Admission: RE | Admit: 2011-06-10 | Discharge: 2011-06-10 | Disposition: A | Discharge status: Home / Self Care | Payer: Medicare Other | Source: Ambulatory Visit | Attending: Neurosurgery | Admitting: Neurosurgery

## 2011-06-10 ENCOUNTER — Encounter (HOSPITAL_COMMUNITY): Payer: Self-pay

## 2011-06-10 ENCOUNTER — Other Ambulatory Visit (HOSPITAL_COMMUNITY): Payer: Self-pay | Admitting: Neurosurgery

## 2011-06-10 DIAGNOSIS — Z538 Procedure and treatment not carried out for other reasons: Secondary | ICD-10-CM | POA: Insufficient documentation

## 2011-06-10 DIAGNOSIS — M4802 Spinal stenosis, cervical region: Secondary | ICD-10-CM

## 2011-06-10 DIAGNOSIS — Z01811 Encounter for preprocedural respiratory examination: Secondary | ICD-10-CM | POA: Insufficient documentation

## 2011-06-10 DIAGNOSIS — Z01810 Encounter for preprocedural cardiovascular examination: Secondary | ICD-10-CM | POA: Insufficient documentation

## 2011-06-10 DIAGNOSIS — Z01818 Encounter for other preprocedural examination: Secondary | ICD-10-CM | POA: Insufficient documentation

## 2011-06-10 HISTORY — DX: Chronic kidney disease, unspecified: N18.9

## 2011-06-10 HISTORY — DX: Unspecified osteoarthritis, unspecified site: M19.90

## 2011-06-10 HISTORY — DX: Atherosclerotic heart disease of native coronary artery without angina pectoris: I25.10

## 2011-06-10 HISTORY — DX: Essential (primary) hypertension: I10

## 2011-06-10 HISTORY — DX: Myoneural disorder, unspecified: G70.9

## 2011-06-10 HISTORY — DX: Acute myocardial infarction, unspecified: I21.9

## 2011-06-10 LAB — CBC
HCT: 43.8 % (ref 39.0–52.0)
Hemoglobin: 14.6 g/dL (ref 13.0–17.0)
MCH: 31.6 pg (ref 26.0–34.0)
MCHC: 33.3 g/dL (ref 30.0–36.0)
RDW: 13.5 % (ref 11.5–15.5)

## 2011-06-10 LAB — BASIC METABOLIC PANEL
BUN: 27 mg/dL — ABNORMAL HIGH (ref 6–23)
Calcium: 10 mg/dL (ref 8.4–10.5)
GFR calc Af Amer: 50 mL/min — ABNORMAL LOW (ref >90)
GFR calc non Af Amer: 43 mL/min — ABNORMAL LOW (ref >90)
Glucose, Bld: 185 mg/dL — ABNORMAL HIGH (ref 70–99)
Sodium: 139 mEq/L (ref 135–145)

## 2011-06-10 NOTE — Pre-Procedure Instructions (Signed)
Christopher [name]  06/10/2011   Your procedure is scheduled on:  06/11/11  Report to [facility] at 1130 AM.  Call this number if you have problems the morning of surgery: [phone]   Remember:   Do not eat food:After Midnight.  Do not drink clear liquids: 4 Hours before arrival.(0730)  Take these medicines the morning of surgery with A SIP OF WATER:  AMLODIPINE   Do not wear jewelry, make-up or nail polish.  Do not wear lotions, powders, or perfumes. You may wear deodorant.  Do not shave 48 hours prior to surgery.  Do not bring valuables to the hospital.  Contacts, dentures or bridgework may not be worn into surgery.  Leave suitcase in the car. After surgery it may be brought to your room.  For patients admitted to the hospital, checkout time is 11:00 AM the day of discharge.   Patients discharged the day of surgery will not be allowed to drive home.  Name and phone number of your driver: Christopher [name] [phone]  Special Instructions: CHG Shower Use Special Wash: 1/2 bottle night before surgery and 1/2 bottle morning of surgery.   Please read over the following fact sheets that you were given: Pain Booklet, Coughing and Deep Breathing, Lab Information, Total Joint Packet, MRSA Information and Surgical Site Infection Prevention

## 2011-06-11 ENCOUNTER — Inpatient Hospital Stay (HOSPITAL_COMMUNITY)
Admission: RE | Admit: 2011-06-11 | Discharge: 2011-06-11 | Disposition: A | Discharge status: Home / Self Care | Payer: Medicare Other | Source: Ambulatory Visit | Attending: Neurosurgery | Admitting: Neurosurgery

## 2011-07-07 ENCOUNTER — Other Ambulatory Visit: Payer: Self-pay | Admitting: Neurosurgery

## 2011-07-11 ENCOUNTER — Encounter (HOSPITAL_COMMUNITY): Payer: Self-pay | Admitting: Respiratory Therapy

## 2011-07-18 ENCOUNTER — Encounter (HOSPITAL_COMMUNITY): Payer: Self-pay

## 2011-07-18 ENCOUNTER — Encounter (HOSPITAL_COMMUNITY)
Admission: RE | Admit: 2011-07-18 | Discharge: 2011-07-18 | Disposition: A | Discharge status: Home / Self Care | Payer: Medicare Other | Source: Ambulatory Visit | Attending: Neurosurgery | Admitting: Neurosurgery

## 2011-07-18 HISTORY — DX: Anxiety disorder, unspecified: F41.9

## 2011-07-18 LAB — CBC
HCT: 40.5 % (ref 39.0–52.0)
Hemoglobin: 14.1 g/dL (ref 13.0–17.0)
MCH: 31.8 pg (ref 26.0–34.0)
MCHC: 34.8 g/dL (ref 30.0–36.0)
RDW: 13 % (ref 11.5–15.5)

## 2011-07-18 LAB — BASIC METABOLIC PANEL
BUN: 19 mg/dL (ref 6–23)
Calcium: 9.5 mg/dL (ref 8.4–10.5)
Creatinine, Ser: 1.12 mg/dL (ref 0.50–1.35)
GFR calc non Af Amer: 66 mL/min — ABNORMAL LOW (ref >90)
Glucose, Bld: 106 mg/dL — ABNORMAL HIGH (ref 70–99)

## 2011-07-18 NOTE — Progress Notes (Signed)
[facility]  [facility].  CALLED FOR STRESS TEST RESULTS,CURRENT EKG.  ALSO EKG AND CXR IN EPIC.  # [phone]. PT RECEIVE CARDIAC CLEARANCE.

## 2011-07-18 NOTE — Pre-Procedure Instructions (Signed)
20 Christopher [name]  07/18/2011   Your procedure is scheduled on:  07/25/11  Report to [facility] at 220 pm  Call this number if you have problems the morning of surgery: [phone]   Remember:   Do not eat food:After Midnight.  May have clear liquids: up to 4 Hours before arrival.  Clear liquids include soda, tea, black coffee, apple or grape juice, broth.  Take these medicines the morning of surgery with A SIP OF WATER: norvas,toprol   Do not wear jewelry, make-up or nail polish.  Do not wear lotions, powders, or perfumes. You may wear deodorant.  Do not shave 48 hours prior to surgery.  Do not bring valuables to the hospital.  Contacts, dentures or bridgework may not be worn into surgery.  Leave suitcase in the car. After surgery it may be brought to your room.  For patients admitted to the hospital, checkout time is 11:00 AM the day of discharge.   Patients discharged the day of surgery will not be allowed to drive home.  Name and phone number of your driver: family  Special Instructions: CHG Shower Use Special Wash: 1/2 bottle night before surgery and 1/2 bottle morning of surgery.   Please read over the following fact sheets that you were given: Coughing and Deep Breathing, MRSA Information and Surgical Site Infection Prevention

## 2011-07-25 ENCOUNTER — Inpatient Hospital Stay (HOSPITAL_COMMUNITY): Payer: Medicare Other

## 2011-07-25 ENCOUNTER — Encounter (HOSPITAL_COMMUNITY): Payer: Self-pay | Admitting: *Deleted

## 2011-07-25 ENCOUNTER — Encounter (HOSPITAL_COMMUNITY)
Admission: RE | Disposition: A | Discharge status: Rehabilitation Facility | Payer: Self-pay | Source: Ambulatory Visit | Attending: Neurosurgery

## 2011-07-25 ENCOUNTER — Inpatient Hospital Stay (HOSPITAL_COMMUNITY)
Admission: RE | Admit: 2011-07-25 | Discharge: 2011-08-01 | DRG: 473 | Disposition: A | Discharge status: Rehabilitation Facility | Payer: Medicare Other | Source: Ambulatory Visit | Attending: Neurosurgery | Admitting: Neurosurgery

## 2011-07-25 ENCOUNTER — Inpatient Hospital Stay (HOSPITAL_COMMUNITY): Payer: Medicare Other | Admitting: *Deleted

## 2011-07-25 DIAGNOSIS — M4802 Spinal stenosis, cervical region: Secondary | ICD-10-CM | POA: Diagnosis present

## 2011-07-25 DIAGNOSIS — N189 Chronic kidney disease, unspecified: Secondary | ICD-10-CM | POA: Diagnosis present

## 2011-07-25 DIAGNOSIS — R609 Edema, unspecified: Secondary | ICD-10-CM | POA: Diagnosis present

## 2011-07-25 DIAGNOSIS — I252 Old myocardial infarction: Secondary | ICD-10-CM

## 2011-07-25 DIAGNOSIS — I129 Hypertensive chronic kidney disease with stage 1 through stage 4 chronic kidney disease, or unspecified chronic kidney disease: Secondary | ICD-10-CM | POA: Diagnosis present

## 2011-07-25 DIAGNOSIS — Z01812 Encounter for preprocedural laboratory examination: Secondary | ICD-10-CM

## 2011-07-25 DIAGNOSIS — M4712 Other spondylosis with myelopathy, cervical region: Principal | ICD-10-CM | POA: Diagnosis present

## 2011-07-25 DIAGNOSIS — E119 Type 2 diabetes mellitus without complications: Secondary | ICD-10-CM | POA: Diagnosis present

## 2011-07-25 HISTORY — PX: POSTERIOR CERVICAL FUSION/FORAMINOTOMY: SHX5038

## 2011-07-25 LAB — GLUCOSE, CAPILLARY: Glucose-Capillary: 138 mg/dL — ABNORMAL HIGH (ref 70–99)

## 2011-07-25 SURGERY — POSTERIOR CERVICAL FUSION/FORAMINOTOMY LEVEL 1
Anesthesia: General | Site: Neck | Wound class: Clean

## 2011-07-25 MED ORDER — PHENOL 1.4 % MT LIQD: 1.0000 | OROMUCOSAL | Status: DC | PRN

## 2011-07-25 MED ORDER — AMLODIPINE BESYLATE 10 MG PO TABS
10.0000 mg | ORAL_TABLET | Freq: Every day | ORAL | Status: DC
  Administered 2011-07-26 – 2011-07-29 (×4): 10 mg via ORAL
  Filled 2011-07-25 (×4): qty 1

## 2011-07-25 MED ORDER — HYDROMORPHONE HCL PF 1 MG/ML IJ SOLN
0.2500 mg | INTRAMUSCULAR | Status: DC | PRN
  Administered 2011-07-25 (×4): 0.5 mg via INTRAVENOUS

## 2011-07-25 MED ORDER — METFORMIN HCL 500 MG PO TABS
1000.0000 mg | ORAL_TABLET | Freq: Two times a day (BID) | ORAL | Status: DC
  Administered 2011-07-26 – 2011-07-30 (×10): 1000 mg via ORAL
  Filled 2011-07-25 (×15): qty 2

## 2011-07-25 MED ORDER — SODIUM CHLORIDE 0.9 % IV SOLN: 250.0000 mL | INTRAVENOUS | Status: DC

## 2011-07-25 MED ORDER — METOPROLOL SUCCINATE ER 25 MG PO TB24
25.0000 mg | ORAL_TABLET | Freq: Every day | ORAL | Status: DC
  Administered 2011-07-26 – 2011-07-28 (×3): 25 mg via ORAL
  Filled 2011-07-25 (×3): qty 1

## 2011-07-25 MED ORDER — BUPIVACAINE-EPINEPHRINE 0.5% -1:200000 IJ SOLN
INTRAMUSCULAR | Status: DC | PRN
  Administered 2011-07-25: 30 mL

## 2011-07-25 MED ORDER — CEFAZOLIN SODIUM-DEXTROSE 2-3 GM-% IV SOLR
INTRAVENOUS | Status: AC
  Filled 2011-07-25: qty 50

## 2011-07-25 MED ORDER — CEFAZOLIN SODIUM 1-5 GM-% IV SOLN
1.0000 g | Freq: Three times a day (TID) | INTRAVENOUS | Status: AC
  Administered 2011-07-26 (×2): 1 g via INTRAVENOUS
  Filled 2011-07-25 (×2): qty 50

## 2011-07-25 MED ORDER — LABETALOL HCL 5 MG/ML IV SOLN
INTRAVENOUS | Status: DC | PRN
  Administered 2011-07-25: 5 mg via INTRAVENOUS

## 2011-07-25 MED ORDER — EPHEDRINE SULFATE 50 MG/ML IJ SOLN
INTRAMUSCULAR | Status: DC | PRN
  Administered 2011-07-25: 10 mg via INTRAVENOUS

## 2011-07-25 MED ORDER — LACTATED RINGERS IV SOLN
INTRAVENOUS | Status: DC | PRN
  Administered 2011-07-25: 15:00:00 via INTRAVENOUS

## 2011-07-25 MED ORDER — SODIUM CHLORIDE 0.9 % IV SOLN
INTRAVENOUS | Status: DC
  Administered 2011-07-26 – 2011-07-27 (×2): via INTRAVENOUS

## 2011-07-25 MED ORDER — HEMOSTATIC AGENTS (NO CHARGE) OPTIME
TOPICAL | Status: DC | PRN
  Administered 2011-07-25: 1 via TOPICAL

## 2011-07-25 MED ORDER — BUPIVACAINE LIPOSOME 1.3 % IJ SUSP
20.0000 mL | Freq: Once | INTRAMUSCULAR | Status: DC
  Filled 2011-07-25: qty 20

## 2011-07-25 MED ORDER — DOCUSATE SODIUM 100 MG PO CAPS
100.0000 mg | ORAL_CAPSULE | Freq: Two times a day (BID) | ORAL | Status: DC
  Administered 2011-07-26 – 2011-08-01 (×13): 100 mg via ORAL
  Filled 2011-07-25 (×9): qty 1

## 2011-07-25 MED ORDER — CEFAZOLIN SODIUM 1-5 GM-% IV SOLN: 1.0000 g | INTRAVENOUS | Status: DC

## 2011-07-25 MED ORDER — BISACODYL 10 MG RE SUPP
10.0000 mg | Freq: Every day | RECTAL | Status: DC | PRN
  Filled 2011-07-25: qty 1

## 2011-07-25 MED ORDER — 0.9 % SODIUM CHLORIDE (POUR BTL) OPTIME
TOPICAL | Status: DC | PRN
  Administered 2011-07-25: 1000 mL

## 2011-07-25 MED ORDER — ZOLPIDEM TARTRATE 5 MG PO TABS: 5.0000 mg | ORAL_TABLET | Freq: Every evening | ORAL | Status: DC | PRN

## 2011-07-25 MED ORDER — ROCURONIUM BROMIDE 100 MG/10ML IV SOLN
INTRAVENOUS | Status: DC | PRN
  Administered 2011-07-25: 50 mg via INTRAVENOUS
  Administered 2011-07-25: 10 mg via INTRAVENOUS

## 2011-07-25 MED ORDER — ONDANSETRON HCL 4 MG/2ML IJ SOLN
INTRAMUSCULAR | Status: DC | PRN
  Administered 2011-07-25: 4 mg via INTRAVENOUS

## 2011-07-25 MED ORDER — LACTATED RINGERS IV SOLN: INTRAVENOUS | Status: DC

## 2011-07-25 MED ORDER — QUINAPRIL HCL 10 MG PO TABS
40.0000 mg | ORAL_TABLET | Freq: Every day | ORAL | Status: DC
  Administered 2011-07-26 – 2011-07-31 (×6): 40 mg via ORAL
  Filled 2011-07-25 (×6): qty 4

## 2011-07-25 MED ORDER — PHENYLEPHRINE HCL 10 MG/ML IJ SOLN
10.0000 mg | INTRAVENOUS | Status: DC | PRN
  Administered 2011-07-25: 10 ug/min via INTRAVENOUS

## 2011-07-25 MED ORDER — POVIDONE-IODINE 10 % EX OINT
TOPICAL_OINTMENT | CUTANEOUS | Status: DC | PRN
  Administered 2011-07-25: 1 via TOPICAL

## 2011-07-25 MED ORDER — MEPERIDINE HCL 25 MG/ML IJ SOLN: 6.2500 mg | INTRAMUSCULAR | Status: DC | PRN

## 2011-07-25 MED ORDER — GLIPIZIDE 10 MG PO TABS
10.0000 mg | ORAL_TABLET | Freq: Every day | ORAL | Status: DC
  Administered 2011-07-26 – 2011-08-01 (×7): 10 mg via ORAL
  Filled 2011-07-25 (×10): qty 1

## 2011-07-25 MED ORDER — ACETAMINOPHEN 325 MG PO TABS
650.0000 mg | ORAL_TABLET | ORAL | Status: DC | PRN
  Administered 2011-07-29 – 2011-07-31 (×3): 650 mg via ORAL
  Filled 2011-07-25 (×3): qty 2

## 2011-07-25 MED ORDER — OXYCODONE-ACETAMINOPHEN 5-325 MG PO TABS
1.0000 | ORAL_TABLET | ORAL | Status: DC | PRN
  Administered 2011-07-26 – 2011-08-01 (×6): 2 via ORAL
  Filled 2011-07-25 (×8): qty 2

## 2011-07-25 MED ORDER — PHENYLEPHRINE HCL 10 MG/ML IJ SOLN
INTRAMUSCULAR | Status: DC | PRN
  Administered 2011-07-25: 80 ug via INTRAVENOUS
  Administered 2011-07-25: 40 ug via INTRAVENOUS
  Administered 2011-07-25: 80 ug via INTRAVENOUS

## 2011-07-25 MED ORDER — MORPHINE SULFATE 4 MG/ML IJ SOLN
1.0000 mg | INTRAMUSCULAR | Status: DC | PRN
  Administered 2011-07-26 (×3): 4 mg via INTRAVENOUS
  Administered 2011-07-27 – 2011-07-31 (×2): 2 mg via INTRAVENOUS
  Administered 2011-08-01 (×2): 4 mg via INTRAVENOUS
  Filled 2011-07-25 (×8): qty 1

## 2011-07-25 MED ORDER — SODIUM CHLORIDE 0.9 % IJ SOLN
3.0000 mL | INTRAMUSCULAR | Status: DC | PRN
  Administered 2011-07-29: 3 mL via INTRAVENOUS

## 2011-07-25 MED ORDER — THROMBIN 5000 UNITS EX KIT
PACK | CUTANEOUS | Status: DC | PRN
  Administered 2011-07-25 (×2): 5000 [IU] via TOPICAL

## 2011-07-25 MED ORDER — MENTHOL 3 MG MT LOZG
1.0000 | LOZENGE | OROMUCOSAL | Status: DC | PRN
  Administered 2011-07-26: 3 mg via ORAL
  Filled 2011-07-25: qty 9

## 2011-07-25 MED ORDER — FENTANYL CITRATE 0.05 MG/ML IJ SOLN
INTRAMUSCULAR | Status: DC | PRN
  Administered 2011-07-25: 100 ug via INTRAVENOUS
  Administered 2011-07-25: 50 ug via INTRAVENOUS

## 2011-07-25 MED ORDER — SODIUM CHLORIDE 0.9 % IJ SOLN
3.0000 mL | Freq: Two times a day (BID) | INTRAMUSCULAR | Status: DC
  Administered 2011-07-26 – 2011-07-31 (×10): 3 mL via INTRAVENOUS

## 2011-07-25 MED ORDER — ONDANSETRON HCL 4 MG/2ML IJ SOLN: 4.0000 mg | INTRAMUSCULAR | Status: DC | PRN

## 2011-07-25 MED ORDER — ACETAMINOPHEN 650 MG RE SUPP: 650.0000 mg | RECTAL | Status: DC | PRN

## 2011-07-25 MED ORDER — CEFAZOLIN SODIUM-DEXTROSE 2-3 GM-% IV SOLR: 2.0000 g | INTRAVENOUS | Status: DC

## 2011-07-25 MED ORDER — PROPOFOL 10 MG/ML IV EMUL
INTRAVENOUS | Status: DC | PRN
  Administered 2011-07-25: 150 mg via INTRAVENOUS
  Administered 2011-07-25: 30 mg via INTRAVENOUS
  Administered 2011-07-25: 20 mg via INTRAVENOUS

## 2011-07-25 MED ORDER — PROMETHAZINE HCL 25 MG/ML IJ SOLN: 6.2500 mg | INTRAMUSCULAR | Status: DC | PRN

## 2011-07-25 MED ORDER — HYDROMORPHONE HCL PF 1 MG/ML IJ SOLN
INTRAMUSCULAR | Status: AC
  Filled 2011-07-25: qty 1

## 2011-07-25 MED ORDER — BUPIVACAINE LIPOSOME 1.3 % IJ SUSP
INTRAMUSCULAR | Status: DC | PRN
  Administered 2011-07-25: 20 mL

## 2011-07-25 SURGICAL SUPPLY — 73 items
APL SKNCLS STERI-STRIP NONHPOA (GAUZE/BANDAGES/DRESSINGS)
BENZOIN TINCTURE PRP APPL 2/3 (GAUZE/BANDAGES/DRESSINGS) IMPLANT
BIT DRILL 2.4XNS REUSE 3.5X (BIT) IMPLANT
BIT DRL 2.4XNS REUSE 3.5X (BIT) ×1
BLADE SURG 11 STRL SS (BLADE) ×1 IMPLANT
BLADE SURG ROTATE 9660 (MISCELLANEOUS) ×2 IMPLANT
BLADE ULTRA TIP 2M (BLADE) ×2 IMPLANT
BONE MATRIX OSTEOCEL PLUS 5CC (Bone Implant) ×1 IMPLANT
BRUSH SCRUB EZ 1% IODOPHOR (MISCELLANEOUS) IMPLANT
BRUSH SCRUB EZ PLAIN DRY (MISCELLANEOUS) ×2 IMPLANT
BUR MATCHSTICK NEURO 3.0 LAGG (BURR) ×2 IMPLANT
CANISTER SUCTION 2500CC (MISCELLANEOUS) ×2 IMPLANT
CAP ELLIPSE LOCKING (Cap) ×4 IMPLANT
CLOTH BEACON ORANGE TIMEOUT ST (SAFETY) ×2 IMPLANT
CONT SPEC 4OZ CLIKSEAL STRL BL (MISCELLANEOUS) ×3 IMPLANT
COVER MAYO STAND STRL (DRAPES) IMPLANT
DECANTER SPIKE VIAL GLASS SM (MISCELLANEOUS) ×1 IMPLANT
DRAPE C-ARM 42X72 X-RAY (DRAPES) ×3 IMPLANT
DRAPE LAPAROTOMY 100X72 PEDS (DRAPES) ×2 IMPLANT
DRAPE MICROSCOPE LEICA (MISCELLANEOUS) IMPLANT
DRAPE ORTHO SPLIT 77X108 STRL (DRAPES)
DRAPE POUCH INSTRU U-SHP 10X18 (DRAPES) ×2 IMPLANT
DRAPE SURG ORHT 6 SPLT 77X108 (DRAPES) IMPLANT
DRILL BIT 3.5 (BIT) ×2
DURAPREP 6ML APPLICATOR 50/CS (WOUND CARE) ×2 IMPLANT
ELECT BLADE 4.0 EZ CLEAN MEGAD (MISCELLANEOUS) ×2
ELECT REM PT RETURN 9FT ADLT (ELECTROSURGICAL) ×2
ELECTRODE BLDE 4.0 EZ CLN MEGD (MISCELLANEOUS) IMPLANT
ELECTRODE REM PT RTRN 9FT ADLT (ELECTROSURGICAL) ×1 IMPLANT
EVACUATOR 1/8 PVC DRAIN (DRAIN) IMPLANT
GAUZE SPONGE 4X4 16PLY XRAY LF (GAUZE/BANDAGES/DRESSINGS) IMPLANT
GLOVE BIO SURGEON STRL SZ 6.5 (GLOVE) ×2 IMPLANT
GLOVE BIOGEL M 8.0 STRL (GLOVE) ×2 IMPLANT
GLOVE BIOGEL PI IND STRL 7.0 (GLOVE) IMPLANT
GLOVE BIOGEL PI IND STRL 8 (GLOVE) IMPLANT
GLOVE BIOGEL PI INDICATOR 7.0 (GLOVE) ×1
GLOVE BIOGEL PI INDICATOR 8 (GLOVE) ×4
GLOVE ECLIPSE 7.5 STRL STRAW (GLOVE) ×4 IMPLANT
GLOVE EXAM NITRILE LRG STRL (GLOVE) IMPLANT
GLOVE EXAM NITRILE MD LF STRL (GLOVE) IMPLANT
GLOVE EXAM NITRILE XL STR (GLOVE) IMPLANT
GLOVE EXAM NITRILE XS STR PU (GLOVE) IMPLANT
GOWN BRE IMP SLV AUR LG STRL (GOWN DISPOSABLE) ×2 IMPLANT
GOWN BRE IMP SLV AUR XL STRL (GOWN DISPOSABLE) ×1 IMPLANT
GOWN STRL REIN 2XL LVL4 (GOWN DISPOSABLE) ×3 IMPLANT
KIT BASIN OR (CUSTOM PROCEDURE TRAY) ×2 IMPLANT
KIT ROOM TURNOVER OR (KITS) ×2 IMPLANT
NDL HYPO 18GX1.5 BLUNT FILL (NEEDLE) IMPLANT
NEEDLE HYPO 18GX1.5 BLUNT FILL (NEEDLE) IMPLANT
NS IRRIG 1000ML POUR BTL (IV SOLUTION) ×2 IMPLANT
PACK LAMINECTOMY NEURO (CUSTOM PROCEDURE TRAY) ×2 IMPLANT
PAD ARMBOARD 7.5X6 YLW CONV (MISCELLANEOUS) ×6 IMPLANT
PATTIES SURGICAL .25X.25 (GAUZE/BANDAGES/DRESSINGS) IMPLANT
PIN MAYFIELD SKULL DISP (PIN) ×2 IMPLANT
ROD SPINE POST 3.5X80 (Rod) ×1 IMPLANT
RUBBERBAND STERILE (MISCELLANEOUS) IMPLANT
SCREW 3.5X12MM (Screw) ×4 IMPLANT
SPONGE GAUZE 4X4 12PLY (GAUZE/BANDAGES/DRESSINGS) IMPLANT
SPONGE LAP 4X18 X RAY DECT (DISPOSABLE) IMPLANT
STAPLER SKIN PROX WIDE 3.9 (STAPLE) IMPLANT
STRIP CLOSURE SKIN 1/2X4 (GAUZE/BANDAGES/DRESSINGS) ×1 IMPLANT
SUT ETHILON 3 0 FSL (SUTURE) IMPLANT
SUT ETHILON 4 0 PS 2 18 (SUTURE) IMPLANT
SUT VIC AB 0 CT1 18XCR BRD 8 (SUTURE) IMPLANT
SUT VIC AB 0 CT1 8-18 (SUTURE) ×2
SUT VIC AB 2-0 CP2 18 (SUTURE) ×1 IMPLANT
SUT VIC AB 3-0 SH 8-18 (SUTURE) ×2 IMPLANT
SYR 20ML ECCENTRIC (SYRINGE) ×2 IMPLANT
SYR 3ML LL SCALE MARK (SYRINGE) IMPLANT
TOWEL OR 17X24 6PK STRL BLUE (TOWEL DISPOSABLE) ×2 IMPLANT
TOWEL OR 17X26 10 PK STRL BLUE (TOWEL DISPOSABLE) ×2 IMPLANT
UNDERPAD 30X30 INCONTINENT (UNDERPADS AND DIAPERS) IMPLANT
WATER STERILE IRR 1000ML POUR (IV SOLUTION) ×2 IMPLANT

## 2011-07-25 NOTE — H&P (Signed)
Christopher [name] is an 70 y.o. male.   Chief Complaint: chronic history of cervical,thoracic and lumbar pain with chronic weakness with acute onset of weakness in both upper extremities while in a fishing trip. HPI: well known to me because of history of chronic spine pain with an anterior cervical fusion from cervical4 to 7. Acute onset of deltoids weakness and  Burning pain all over while sneezing. Was schedule for surgery few weeks ago but it was cancelled because of neef for cardiologist w/u. Marland Kitchen Past Medical History  Diagnosis Date  . Coronary artery disease   . Arthritis   . oral control   . 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 Hypertension     stress test done    [facility] in [place]  . Myocardial infarction     ,2003,     . Anxiety   . 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 Back surgery   . Coronary angioplasty     2003      [facility]  . Joint replacement left total knee 2005    both shoulders    History reviewed. No pertinent family history. Social History:  reports that he quit smoking about 26 years ago. He does not have any smokeless tobacco history on file. He reports that he drinks alcohol. He reports that he does not use illicit drugs.  Allergies:  Allergies  Allergen Reactions  . Neurontin (Gabapentin) Other (See Comments)    Unknown reaction    Medications Prior to Admission  Medication Dose Route Frequency Provider Last Rate Last Dose  . ceFAZolin (ANCEF) 2-3 GM-% IVPB SOLR           . ceFAZolin (ANCEF) IVPB 2 g/50 mL premix  2 g Intravenous 60 min Pre-Op Christopher [name]      . DISCONTD: ceFAZolin (ANCEF) IVPB 1 g/50 mL premix  1 g Intravenous 60 min Pre-Op Christopher Nones Christopher [name]       Medications Prior to Admission  Medication Sig Dispense Refill  . amLODipine (NORVASC) 10 MG tablet Take 10 mg by mouth daily.        Marland Kitchen aspirin EC 81 MG tablet Take 81 mg by  mouth daily.        Marland Kitchen glipiZIDE (GLUCOTROL) 10 MG tablet Take 10 mg by mouth daily.        . metFORMIN (GLUCOPHAGE) 1000 MG tablet Take 1,000 mg by mouth 2 (two) times daily with a meal.        . metoprolol succinate (TOPROL-XL) 25 MG 24 hr tablet Take 25 mg by mouth daily.        . Multiple Vitamin (MULTIVITAMIN) tablet Take 1 tablet by mouth daily.        . Omega-3 Fatty Acids (FISH OIL) 1200 MG CAPS Take 1 capsule by mouth daily.        . quinapril (ACCUPRIL) 40 MG tablet Take 40 mg by mouth daily.          Results for orders placed during the hospital encounter of 07/25/11 (from the past 48 hour(s))  GLUCOSE, CAPILLARY     Status: Abnormal   Collection Time   07/25/11  1:59 PM      Component Value Range Comment   Glucose-Capillary 138 (*) 70 - 99 (mg/dL)    No results found.  Review of Systems  Constitutional: Negative.   HENT: Positive for neck pain.   Eyes: Negative.   Respiratory: Positive for cough.   Cardiovascular:       History of cardiac   Disease  clearedup by his cardiologist  Gastrointestinal: Negative.   Skin: Negative.   Neurological: Positive for focal weakness.       Balance disturbance  Endo/Heme/Allergies: Negative.   Psychiatric/Behavioral: Negative.     Blood pressure 157/84, pulse 78, temperature 98 F (36.7 C), temperature source Oral, resp. rate 18, SpO2 96.00%. Physical Exam heent nl. Neck scar anterior with decrease of flexibility. Lungs mild ronchii bilaterl. cv no murmurs. Abdomen soft. Extremities grade 1 edema.  NEURO  ORIENTED X3. CN NORMAL.trenght  Weakness of both deltoids and biceps. dtr nl. Sensation dysesthesias all over. Can not walk in a straight line xrays  Cervical 3 4 stenosis with canal of 8 mm. Fusion c 4 to 7. ddd at cervical 7 1. Spinal cord atrophy.  Assessment/Plan Decompression posterior at cervical 3 to 4 with fusion using vitoss and lateral masses screws with c-arm. He and family are aware of risks including paralisys , no  improvement, csf leak,infection  Christopher [name] M 07/25/2011, 3:30 PM

## 2011-07-25 NOTE — Anesthesia Procedure Notes (Signed)
Performed by: Iona Hansen

## 2011-07-25 NOTE — Anesthesia Postprocedure Evaluation (Signed)
  Anesthesia Post Note  Patient: Christopher [name]  Procedure(s) Performed:  POSTERIOR CERVICAL FUSION/FORAMINOTOMY LEVEL 1 - Cervical Three-Four Laminectomy, Lateral Mass screws  Anesthesia type: General  Patient location: PACU  Post pain: Pain level controlled  Post assessment: Patient's Cardiovascular Status Stable  Last Vitals:  Filed Vitals:   07/25/11 1855  BP:   Pulse:   Temp: 36.9 C  Resp:     Post vital signs: Reviewed and stable  Level of consciousness: sedated  Complications: No apparent anesthesia complications

## 2011-07-25 NOTE — Anesthesia Preprocedure Evaluation (Signed)
Anesthesia Evaluation  Patient identified by MRN, date of birth, ID band Patient awake    Reviewed: Allergy & Precautions, H&P , Patient's Chart, lab work & pertinent test results  Airway Mallampati: I  Neck ROM: Full    Dental  (+) Edentulous Upper, Missing and Partial Lower   Pulmonary neg pulmonary ROS,  clear to auscultation        Cardiovascular hypertension, + CAD and + Past MI Regular Normal Cardiac clearance obtained , no problems , will proceed   Neuro/Psych  Neuromuscular disease    GI/Hepatic   Endo/Other  Diabetes mellitus-  Renal/GU      Musculoskeletal   Abdominal (+)  Abdomen: soft.    Peds  Hematology   Anesthesia Other Findings   Reproductive/Obstetrics                           Anesthesia Physical Anesthesia Plan  ASA: III  Anesthesia Plan: General   Post-op Pain Management:    Induction: Intravenous  Airway Management Planned: Oral ETT  Additional Equipment:   Intra-op Plan:   Post-operative Plan: Extubation in OR  Informed Consent:   Dental advisory given  Plan Discussed with: CRNA and Surgeon  Anesthesia Plan Comments:         Anesthesia Quick Evaluation

## 2011-07-25 NOTE — Transfer of Care (Signed)
Immediate Anesthesia Transfer of Care Note  Patient: Christopher [name]  Procedure(s) Performed:  POSTERIOR CERVICAL FUSION/FORAMINOTOMY LEVEL 1 - Cervical Three-Four Laminectomy, Lateral Mass screws  Patient Location: PACU  Anesthesia Type: General  Level of Consciousness: awake, alert  and sedated  Airway & Oxygen Therapy: Patient Spontanous Breathing  Post-op Assessment: Report given to PACU RN, Post -op Vital signs reviewed and stable and Patient moving all extremities  Post vital signs: Reviewed and stable  Complications: No apparent anesthesia complications

## 2011-07-25 NOTE — Brief Op Note (Signed)
07/25/2011  6:38 PM  PATIENT:  Christopher [name]  70 y.o. male  PRE-OPERATIVE DIAGNOSIS:  Cervical stenosis, Cervical spondylosis, Cervicalgia  POST-OPERATIVE DIAGNOSIS:  Cervical stenosis, Cervical spondylosis, Cervicalgia  PROCEDURE:  Procedure(s): POSTERIOR CERVICAL FUSION/FORAMINOTOMY LEVEL 1 c3 4. Lateral mass screws  pla with autograf and osteocell.  SURGEON:  Surgeon(s): Karn Cassis Dorian Heckle, MD  PHYSICIAN ASSISTANT:dr stern   ASSISTANTS: none   ANESTHESIA:   general  EBL:  Total I/O In: 1700 [I.V.:1700] Out: 100 [Blood:100]  BLOOD ADMINISTERED:none  DRAINS: none   LOCAL MEDICATIONS USED:  OTHER exparel  SPECIMEN:  No Specimen  DISPOSITION OF SPECIMEN:  N/A  COUNTS:  YES as told to me by nurses.  TOURNIQUET:  * No tourniquets in log *  DICTATION: .Other Dictation: Dictation Number [id number]  PLAN OF CARE: Admit to inpatient   PATIENT DISPOSITION:  PACU - hemodynamically stable.   Delay start of Pharmacological VTE agent (>24hrs) due to surgical blood loss or risk of bleeding:  {YES/NO/NOT APPLICABLE:20182

## 2011-07-25 NOTE — Preoperative (Signed)
Beta Blockers   Reason not to administer Beta Blockers:Not Applicable 

## 2011-07-26 LAB — GLUCOSE, CAPILLARY
Glucose-Capillary: 230 mg/dL — ABNORMAL HIGH (ref 70–99)
Glucose-Capillary: 210 mg/dL — ABNORMAL HIGH (ref 70–99)
Glucose-Capillary: 160 mg/dL — ABNORMAL HIGH (ref 70–99)

## 2011-07-26 MED ORDER — WHITE PETROLATUM GEL
Status: AC
  Administered 2011-07-26: 22:00:00
  Filled 2011-07-26: qty 5

## 2011-07-26 MED ORDER — INFLUENZA VIRUS VACC SPLIT PF IM SUSP
0.5000 mL | INTRAMUSCULAR | Status: AC
  Administered 2011-07-27: 0.5 mL via INTRAMUSCULAR
  Filled 2011-07-26: qty 0.5

## 2011-07-26 NOTE — Progress Notes (Signed)
Postop day 1. Afebrile with stable vitals.  The patient complains of neck pain. Denies any pain radiating into either extremity. Denies any change in his weakness involving either his upper or lower trimming his. States that his sensation feels a little better in both hands specifically on his right side. No other complaints.  On exam he is awake alert oriented and appropriate. Motor examination reveals 4 minus over 5 strength in his supraspinatus and deltoid on the right and left side. Biceps triceps and grips and intrinsic strength graded out at 4/5 with spasticity. Lower extremity strength graded out at 45 with spasticity. This is unchanged from preop by report. Sensation stable. Wound clean and dry.  Progressing reasonably well following cervical decompression and fusion. Mobilize with physical and occupational therapy. Question whether the patient would be an appropriate inpatient rehabilitation candidate.

## 2011-07-26 NOTE — Op Note (Signed)
NAMELANDRY, KAMATH                 ACCOUNT NO.:  [id number]  MEDICAL RECORD NO.:  [id number]  LOCATION:  3021                         FACILITY:  [facility]  PHYSICIAN:  Hilda Lias, M.D.   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  PROCEDURE:  Bilateral C3-C4 laminectomy.  Prone position with clamps. Decompression of spinal cord, bilateral foraminotomy, lateral mass screws 12 mm length at C3-C4 facet.  Posterolateral arthrodesis with autograft and Osteocel.  SURGEON:  Hilda Lias, MD  ASSISTANT:  Danae Orleans. Venetia Maxon, MD  CLINICAL HISTORY:  Ms. Dyar is a gentleman who many years ago underwent decompression anteriorly.  He came to my office several weeks ago with sudden onset of weakness of the deltoid and biceps after he was out fishing.  We found by the x-ray that he has severe stenosis at the level of C3-C4.  He has already quite a bit of atrophy to spinal cord.  The area between C4 and C7 where he had surgery looks normal.  Surgery was advised through the posterior approach secondary to the short neck.  The patient knew the risk of the surgery including possibility of no improvement.  PROCEDURE:  The patient was taken to the OR and after intubation 3 pins were applied to the head.  He was positioned in a prone manner.  The neck was cleaned with DuraPrep.  Drapes were applied.  Midline incision from C1-2 down to C4-5 was made.  Muscle retracted laterally.  We identified C2 easily.  He had bifida spinous process.  Removal of the spinous processes of C3 and C4 as well as well as the lamina using the drill was accomplished.  The patient has quite a bit of thick  yellow ligament which was also excised.  Once the decompression was done, we were to able to see that the spinal cord moved back with plenty of space.  There was good space above and below.  Then using the C-arm, we probed the facet of C3 and later on C4 and lateral mass screw was inserted.  The length was 12 mm.  Prior to insertion of the screw, we probed the area which showed bones at all 4 quadrants.  Rod was used from the upper to the lower screw and was kept in place with caps.  Then, we removed the periosteum of C2-C4 mediolaterally and a mix of autograft and Osteocel was used for arthrodesis.  Hemostasis was accomplished and the wound was closed with Vicryl and Steri-Strips.          ______________________________ Hilda Lias, M.D.     EB/MEDQ  D:  07/25/2011  T:  07/26/2011  Job:  [id number]

## 2011-07-26 NOTE — Progress Notes (Signed)
Physical Therapy Evaluation Patient Details Name: Christopher [name] MRN: [id number] DOB: 22-Sep-1940 Today's Date: 07/26/2011  Problem List: There is no problem list on file for this patient.   Past Medical History:  Past Medical History  Diagnosis Date  . Coronary artery disease   . Arthritis   . oral control   . 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 Hypertension     stress test done    [facility] [place]  . Myocardial infarction     ,2003,     . Anxiety   . 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 Back surgery   . Coronary angioplasty     2003      [facility]  . Joint replacement left total knee 2005    both shoulders    PT Assessment/Plan/Recommendation PT Assessment Clinical Impression Statement: Patient is a 70 yo male admitted with cervical myelopathy for C3-4 fusion.  Patient with decreased strength and coordination of all extremities, impacting functional mobility.  Patient has been declining in function since October, and had several falls.  Recommend Inpatient Rehab consult for intense therapy prior to discharge home with family. PT Recommendation/Assessment: Patient will need skilled PT in the acute care venue PT Problem List: Decreased strength;Decreased activity tolerance;Decreased balance;Decreased mobility;Decreased coordination;Decreased knowledge of use of DME;Impaired sensation;Pain PT Therapy Diagnosis : Difficulty walking;Abnormality of gait;Generalized weakness;Acute pain PT Plan PT Frequency: Min 5X/week PT Treatment/Interventions: DME instruction;Gait training;Functional mobility training;Balance training;Therapeutic activities;Patient/family education PT Recommendation Recommendations for Other Services: Rehab consult Follow Up Recommendations: Inpatient Rehab Equipment Recommended: Defer to next venue PT Goals  Acute Rehab  PT Goals PT Goal Formulation: With patient/family Time For Goal Achievement: 2 weeks Pt will go Supine/Side to Sit: with supervision;with HOB 0 degrees PT Goal: Supine/Side to Sit - Progress: Not met Pt will go Sit to Supine/Side: with supervision;with HOB 0 degrees PT Goal: Sit to Supine/Side - Progress: Not met Pt will go Sit to Stand: with supervision PT Goal: Sit to Stand - Progress: Not met Pt will go Stand to Sit: with supervision PT Goal: Stand to Sit - Progress: Not met Pt will Transfer Bed to Chair/Chair to Bed: with supervision PT Transfer Goal: Bed to Chair/Chair to Bed - Progress: Not met Pt will Ambulate: >150 feet;with supervision;with least restrictive assistive device PT Goal: Ambulate - Progress: Not met  PT Evaluation Precautions/Restrictions  Precautions Precautions: Fall Required Braces or Orthoses: No Restrictions Weight Bearing Restrictions: No Prior Functioning  Home Living Lives With: Spouse Receives Help From: Family (Family provided 24/7 assist for past 2 months) Type of Home: House Home Layout: Two level;Able to live on main level with bedroom/bathroom Home Access: Stairs to enter Entrance Stairs-Rails: Right Entrance Stairs-Number of Steps: 1 Bathroom Shower/Tub: Health visitor: Standard Home Adaptive Equipment: Straight cane Prior Function Level of Independence: Requires assistive device for independence;Needs assistance with ADLs;Needs assistance with homemaking;Needs assistance with gait (Since October when pain/weakness increased.) Driving: No Cognition Cognition Arousal/Alertness: Awake/alert Overall Cognitive Status: Appears within functional limits for tasks assessed Orientation Level: Oriented X4 Sensation/Coordination Sensation Light Touch: Impaired by gross assessment (Patient reports numbness/tingling feet and hands) Coordination Gross Motor Movements are Fluid and Coordinated: No Coordination and Movement  Description: Decreased coordination of bil. LE's Heel Shin Test: Decreased control with overshooting Extremity Assessment RUE Assessment RUE Assessment: Exceptions  to [facility] RUE PROM (degrees) Overall PROM Right Upper Extremity: Within functional limits for tasks performed RUE Strength RUE Overall Strength: Deficits (Shoulder 3/5; elbow and hand 4-/5) LUE Assessment LUE Assessment: Exceptions to WFL LUE PROM (degrees) Overall PROM Left Upper Extremity: Within functional limits for tasks assessed LUE Strength LUE Overall Strength: Deficits (Shoulder 3-/5; elbow and hand 4-/5) RLE Assessment RLE Assessment: Exceptions to [facility] RLE AROM (degrees) Overall AROM Right Lower Extremity: Within functional limits for tasks assessed RLE Strength RLE Overall Strength: Deficits (Grossly 4/5) RLE Overall Strength Comments: Decreased coordination and strength LLE Assessment LLE Assessment: Exceptions to WFL LLE AROM (degrees) Overall AROM Left Lower Extremity: Within functional limits for tasks assessed LLE Strength LLE Overall Strength: Deficits (Grossly strength 4/5) LLE Overall Strength Comments: Note decreased coordination and strength Mobility (including Balance) Bed Mobility Bed Mobility: No (Patient sitting in chair as PT entered room) Transfers Transfers: Yes Sit to Stand: 3: Mod assist;With upper extremity assist;With armrests;From chair/3-in-1 Sit to Stand Details (indicate cue type and reason): Cues to scoot to edge of chair before trying to stand.  Cues for hand placement Stand to Sit: 4: Min assist;With armrests;With upper extremity assist;To chair/3-in-1 Stand to Sit Details: Cues for technique and hand placement Ambulation/Gait Ambulation/Gait: Yes Ambulation/Gait Assistance: 3: Mod assist Ambulation/Gait Assistance Details (indicate cue type and reason): Ataxic/uncoordinated LE movement.  Ambulated forward and backward 10' x 3. Required assist to maneuver RW. Ambulation Distance  (Feet): 30 Feet Assistive device: Rolling walker Gait Pattern: Step-through pattern;Decreased stride length;Ataxic;Trunk flexed Stairs: No  Balance Balance Assessed: Yes Static Standing Balance Static Standing - Balance Support: Bilateral upper extremity supported Static Standing - Level of Assistance: 3: Mod assist Static Standing - Comment/# of Minutes: Required assist to maintain upright midline stance.  Able to stand 2 minutes - fatigue ended sesssion Exercise    End of Session PT - End of Session Equipment Utilized During Treatment: Gait belt Activity Tolerance: Patient limited by fatigue;Patient limited by pain Patient left: in chair;with call bell in reach;with family/visitor present Nurse Communication: Mobility status for transfers General Behavior During Session: [facility] for tasks performed Cognition: [facility] for tasks performed  Vena Austria [phone] 07/26/2011, 6:12 PM

## 2011-07-27 LAB — GLUCOSE, CAPILLARY
Glucose-Capillary: 114 mg/dL — ABNORMAL HIGH (ref 70–99)
Glucose-Capillary: 227 mg/dL — ABNORMAL HIGH (ref 70–99)
Glucose-Capillary: 119 mg/dL — ABNORMAL HIGH (ref 70–99)
Glucose-Capillary: 189 mg/dL — ABNORMAL HIGH (ref 70–99)

## 2011-07-27 MED ORDER — MORPHINE SULFATE 15 MG PO TABS
15.0000 mg | ORAL_TABLET | ORAL | Status: DC | PRN
  Administered 2011-07-28: 15 mg via ORAL
  Filled 2011-07-27: qty 1

## 2011-07-27 MED ORDER — HYDROMORPHONE HCL 2 MG PO TABS
2.0000 mg | ORAL_TABLET | ORAL | Status: DC | PRN
  Administered 2011-07-27 – 2011-07-29 (×5): 2 mg via ORAL
  Filled 2011-07-27 (×5): qty 1

## 2011-07-27 NOTE — Progress Notes (Signed)
Subjective: Still weak and painful in posterior neck.  Objective: Vital signs in last 24 hours: Temp:  [97.6 F (36.4 C)-98.9 F (37.2 C)] 98.5 F (36.9 C) (12/16 0200) Pulse Rate:  [79-103] 83  (12/16 0200) Resp:  [16-18] 18  (12/16 0200) BP: (126-176)/(77-100) 126/77 mmHg (12/16 0200) SpO2:  [90 %-93 %] 93 % (12/16 0200) Weight change:  Last BM Date: 07/25/11  Intake/Output from previous day: 12/15 0701 - 12/16 0700 In: 3 [I.V.:3] Out: 100 [Urine:100] Last cbgs: CBG (last 3)   Basename 07/26/11 2138 07/26/11 1652 07/26/11 1202  GLUCAP 160* 177* 191*     Physical Exam Weak bilateral deltoids.  Dressing C/D/I.  Lab Results: No results found for this basename: WBC:2,HGB:2,HCT:2,PLT:2 in the last 72 hours BMET No results found for this basename: NA:2,K:2,CL:2,CO2:2,GLUCOSE:2,BUN:2,CREATININE:2,CALCIUM:2 in the last 72 hours  Studies/Results: Dg Cervical Spine 2-3 Views  07/25/2011  *RADIOLOGY REPORT*  Clinical Data: C3-4 posterior fusion  CERVICAL SPINE - 2-3 VIEW  Comparison: CT myelogram 05/30/2011  Findings: AP and lateral C-arm images were obtained.  Limited visualization of the cervical spine extending down to C3 on the lateral view.  Posterior hardware is present at C3-4.  This is only partially imaged on this study.  Further radiographs are suggested to evaluate for hardware positioning.  IMPRESSION: Posterior hardware fusion at C3-4 with limited visualization of the cervical spine and hardware.  Original Report Authenticated By: Camelia Phenes, M.D.    Medications: I have reviewed the patient's current medications.  Assessment/Plan: Mobilize with PT.  Will give options for pain medications.  Dorian Heckle , MD 07/27/2011, 6:35 AM

## 2011-07-27 NOTE — Progress Notes (Signed)
Occupational Therapy Evaluation Patient Details Name: Christopher [name] MRN: [id number] DOB: [birth year] Today's Date: 07/27/2011  Problem List: There is no problem list on file for this patient.   Past Medical History:  Past Medical History  Diagnosis Date  . Coronary artery disease   . Arthritis   . oral control   . 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 Hypertension     stress test done    [facility] [place]  . Myocardial infarction     ,2003,     . Anxiety   . 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 Back surgery   . Coronary angioplasty     2003      [facility]  . Joint replacement left total knee 2005    both shoulders    OT Assessment/Plan/Recommendation OT Assessment Clinical Impression Statement: Pt will benefit from acute OT to increase I with ADLs and functional transfers to maximize benefits of rehab at [facility] OT Recommendation/Assessment: Patient will need skilled OT in the acute care venue OT Problem List: Decreased range of motion;Decreased activity tolerance;Impaired balance (sitting and/or standing);Decreased knowledge of use of DME or AE;Pain;Decreased strength OT Therapy Diagnosis : Generalized weakness;Acute pain OT Plan OT Frequency: Min 2X/week OT Treatment/Interventions: Self-care/ADL training;DME and/or AE instruction;Therapeutic activities;Patient/family education;Balance training OT Recommendation Recommendations for Other Services: Rehab consult Follow Up Recommendations: Inpatient Rehab Equipment Recommended: Defer to next venue Individuals Consulted Consulted and Agree with Results and Recommendations: Patient OT Goals Acute Rehab OT Goals OT Goal Formulation: With patient Time For Goal Achievement: 2 weeks ADL Goals Pt Will Perform Grooming: with set-up;Standing at sink ADL Goal: Grooming - Progress: Other  (comment) Pt Will Perform Upper Body Bathing: with set-up;Sitting, chair;Sitting, edge of bed;Unsupported ADL Goal: Upper Body Bathing - Progress: Other (comment) Pt Will Perform Lower Body Bathing: with set-up;Sit to stand from chair;Sit to stand from bed ADL Goal: Lower Body Bathing - Progress: Other (comment) Pt Will Perform Upper Body Dressing: with set-up;Sitting, chair;Sitting, bed;Unsupported ADL Goal: Upper Body Dressing - Progress: Other (comment) Pt Will Perform Lower Body Dressing: with set-up;Sit to stand from chair;Sit to stand from bed ADL Goal: Lower Body Dressing - Progress: Other (comment) Pt Will Transfer to Toilet: with supervision;with DME;Ambulation;3-in-1 ADL Goal: Toilet Transfer - Progress: Other (comment)  OT Evaluation Precautions/Restrictions  Precautions Precautions: Fall Required Braces or Orthoses: No Restrictions Weight Bearing Restrictions: No Prior Functioning Home Living Lives With: Spouse Receives Help From: Family Type of Home: House Home Layout: Two level;Able to live on main level with bedroom/bathroom Home Access: Stairs to enter Entrance Stairs-Rails: Right Entrance Stairs-Number of Steps: 1 Bathroom Shower/Tub: Walk-in Contractor: Standard Prior Function Level of Independence: Requires assistive device for independence;Needs assistance with ADLs;Needs assistance with homemaking;Needs assistance with gait Driving: No ADL ADL Grooming: Simulated;Minimal assistance Grooming Details (indicate cue type and reason): Min assist due to decrease strength/AROM in bil UE Where Assessed - Grooming: Sitting, chair;Supported Upper Body Bathing: Simulated;Minimal assistance Where Assessed - Upper Body Bathing: Sitting, chair;Supported Lower Body Bathing: Simulated;Minimal assistance Where Assessed - Lower Body Bathing: Sit to stand from chair;Supported Location manager Dressing: Simulated;Minimal assistance Where Assessed - Upper Body  Dressing: Sitting, chair;Supported Lower Body Dressing: Minimal assistance;Simulated Where Assessed - Lower Body Dressing: Sit to stand from chair;Supported Statistician: Simulated;+2 Total assistance (85) Toilet Transfer Method:  Ambulating Equipment Used: Rolling walker ADL Comments: Pt with decreased I with ADLs and functional transfers due to generalized weakness and pain Vision/Perception    Cognition Cognition Arousal/Alertness: Awake/alert Overall Cognitive Status: Appears within functional limits for tasks assessed Orientation Level: Oriented X4 Sensation/Coordination Sensation Light Touch: Impaired Detail Light Touch Impaired Details: Impaired RUE;Impaired LUE (Difficulty detecting light touch in R and L digits) Coordination Fine Motor Movements are Fluid and Coordinated: No Coordination and Movement Description: Decreased fine motor coordination in bil UE Extremity Assessment RUE Assessment RUE Assessment: Exceptions to [facility] RUE AROM (degrees) Right Shoulder Flexion  0-170: 90 Degrees RUE PROM (degrees) Overall PROM Right Upper Extremity: Within functional limits for tasks performed RUE Strength RUE Overall Strength: Deficits (Shoulder 3/5; elbow and hand 4/5) LUE Assessment LUE Assessment: Exceptions to [facility] LUE AROM (degrees) Left Shoulder Flexion  0-170: 90 Degrees LUE PROM (degrees) Overall PROM Left Upper Extremity: Within functional limits for tasks assessed LUE Strength LUE Overall Strength: Deficits (Shoulder 3-/5; elbow and hand 4-/5) Mobility  Transfers Transfers: Yes Sit to Stand: 1: +2 Total assist;Patient percentage (comment);With upper extremity assist;With armrests;From chair/3-in-1 (70%) Sit to Stand Details (indicate cue type and reason): Manual cues to facilitate anterior weight shift and to steady pt once standing. Pt with anterior lean in static standing position. Stand to Sit: 1: +2 Total assist;To chair/3-in-1;With armrests;With upper  extremity assist;Patient percentage (comment) (70%) Stand to Sit Details: Cue for hand placement and controlled descent Exercises   End of Session OT - End of Session Equipment Utilized During Treatment: Gait belt Activity Tolerance: Patient limited by fatigue;Patient limited by pain Patient left: in chair;with call bell in reach;with family/visitor present General Behavior During Session: [facility] for tasks performed Cognition: [facility] for tasks performed   Cipriano Mile 07/27/2011, 2:18 PM  07/27/2011 Cipriano Mile OTR/L Pager [phone] Office [phone]

## 2011-07-27 NOTE — Progress Notes (Signed)
Physical Therapy Treatment Patient Details Name: Christopher [name] MRN: [id number] DOB: [birth year] Today's Date: 07/27/2011  PT Assessment/Plan  PT - Assessment/Plan Comments on Treatment Session: Patient requiring +2 assist for mobility for safety due to weakness in all 4 extremities.  Decreased knee control in stance phase of gait.  Making improvements - very determined patient. PT Plan: Discharge plan remains appropriate;Frequency remains appropriate PT Frequency: Min 5X/week Follow Up Recommendations: Inpatient Rehab Equipment Recommended: Defer to next venue PT Goals  Acute Rehab PT Goals PT Goal: Sit to Stand - Progress: Progressing toward goal PT Goal: Stand to Sit - Progress: Progressing toward goal PT Goal: Ambulate - Progress: Progressing toward goal  PT Treatment Precautions/Restrictions  Precautions Precautions: Fall Required Braces or Orthoses: No Restrictions Weight Bearing Restrictions: No Mobility (including Balance) Transfers Transfers: Yes Sit to Stand: 1: +2 Total assist;Patient percentage (comment);With upper extremity assist;With armrests;From chair/3-in-1 (Patient = 70%) Sit to Stand Details (indicate cue type and reason): Verbal cues for hand placement.  Manual cues to facilitate anterior weight shift and for balance. Stand to Sit: 1: +2 Total assist;With upper extremity assist;With armrests;To chair/3-in-1;Patient percentage (comment) (Patient = 70%) Stand to Sit Details: Cues for hand placement.  Assist to control descent to chair. Ambulation/Gait Ambulation/Gait: Yes Ambulation/Gait Assistance: 1: +2 Total assist;Patient percentage (comment) (Patient = 80 % (+2 assist for safety)) Ambulation/Gait Assistance Details (indicate cue type and reason): Decreased coordination of LE's during gait.  Right knee buckling during gait.  Some difficulty holding RW with hands.  Cues to focus on trunk and knee extension during stance. Ambulation Distance (Feet): 62  Feet Assistive device: Rolling walker Gait Pattern: Step-through pattern;Decreased stride length;Left flexed knee in stance;Right flexed knee in stance;Trunk flexed    Exercise    End of Session PT - End of Session Equipment Utilized During Treatment: Gait belt Activity Tolerance: Patient limited by fatigue;Patient limited by pain Patient left: in chair;with call bell in reach;with family/visitor present Nurse Communication: Mobility status for transfers General Behavior During Session: [facility] for tasks performed Cognition: [facility] for tasks performed  Vena Austria [phone] 07/27/2011, 6:13 PM

## 2011-07-28 LAB — TSH: TSH: 0.892 u[IU]/mL (ref 0.350–4.500)

## 2011-07-28 LAB — GLUCOSE, CAPILLARY: Glucose-Capillary: 112 mg/dL — ABNORMAL HIGH (ref 70–99)

## 2011-07-28 MED ORDER — INSULIN ASPART 100 UNIT/ML ~~LOC~~ SOLN
0.0000 [IU] | Freq: Every day | SUBCUTANEOUS | Status: DC
  Administered 2011-07-31: 3 [IU] via SUBCUTANEOUS
  Filled 2011-07-28: qty 3

## 2011-07-28 MED ORDER — INSULIN ASPART 100 UNIT/ML ~~LOC~~ SOLN
0.0000 [IU] | Freq: Three times a day (TID) | SUBCUTANEOUS | Status: DC
  Administered 2011-07-29: 5 [IU] via SUBCUTANEOUS
  Administered 2011-07-29: 2 [IU] via SUBCUTANEOUS
  Administered 2011-07-29: 3 [IU] via SUBCUTANEOUS
  Administered 2011-07-30: 2 [IU] via SUBCUTANEOUS
  Administered 2011-07-30 – 2011-07-31 (×3): 3 [IU] via SUBCUTANEOUS
  Administered 2011-07-31: 2 [IU] via SUBCUTANEOUS
  Administered 2011-07-31: 3 [IU] via SUBCUTANEOUS
  Administered 2011-08-01 (×2): 5 [IU] via SUBCUTANEOUS

## 2011-07-28 MED ORDER — FUROSEMIDE 10 MG/ML IJ SOLN
40.0000 mg | Freq: Once | INTRAMUSCULAR | Status: AC
  Administered 2011-07-28: 40 mg via INTRAVENOUS
  Filled 2011-07-28: qty 4

## 2011-07-28 MED ORDER — METOPROLOL SUCCINATE ER 50 MG PO TB24
50.0000 mg | ORAL_TABLET | Freq: Every day | ORAL | Status: DC
  Administered 2011-07-29: 50 mg via ORAL
  Filled 2011-07-28: qty 1

## 2011-07-28 NOTE — Progress Notes (Signed)
Patient ID: Christopher [name], male   DOB: 06-Jan-1941, 70 y.o.   MRN: [id number] Wound with small drainage. Co pain in both feet.edema grade 1. Seen by pt/ot.to get hospitalist to help with edema.

## 2011-07-28 NOTE — Consult Note (Signed)
Medical Consultation   Christopher [name]  WUJ:[id number]  DOB: 09-27-1940  DOA: 07/25/2011  PCP: Provider Not In System  Requesting physician: Dr. Jeral Fruit, (neurosurgery)  Reason for consultation: Medical comanagement, lower extremity edema   History of Present Illness: Patient is a 70 year old male with history of CAD in 2003, no history of PCI, history of chronic kidney disease, hypertension, chronic neck and back pain and arthritis admitted under neurosurgery service. Patient had a history of cervical stenosis with neck pain hence underwent a posterior cervical fusion, C3-4 laminectomy. Patient states he still feels weak, was ambulating yesterday with assistance in the hallway. He also is complaining of pain in his both feet and edema up to his knees. He states he has neuropathy but does not recall having chronic edema.   Allergies:   Allergies  Allergen Reactions  . Neurontin (Gabapentin) Other (See Comments)    Unknown reaction      Past Medical History  Diagnosis Date  . Coronary artery disease   . Arthritis   . oral control   . 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 Hypertension     stress test done    [facility] in [place]  . Myocardial infarction     ,2003,     . Anxiety   . 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 Back surgery   . Coronary angioplasty     2003      [facility]  . Joint replacement left total knee 2005    both shoulders    Social History:  reports that he quit smoking about 26 years ago. He does not have any smokeless tobacco history on file. He reports that he drinks alcohol. He reports that he does not use illicit drugs.  History reviewed. No pertinent family history.  Review of Systems:  Review of Systems:  Constitutional: Denies fever, chills, diaphoresis, appetite change and fatigue.  HEENT: Denies photophobia, eye pain, redness,  hearing loss, ear pain, congestion, sore throat, rhinorrhea, sneezing, mouth sores, trouble swallowing, neck pain, neck stiffness and tinnitus.   Respiratory: Denies SOB, DOE, cough, chest tightness,  and wheezing.   Cardiovascular: Denies chest pain, palpitations. Has peripheral edema bilateral.  Gastrointestinal: Denies nausea, vomiting, abdominal pain, diarrhea, constipation, blood in stool and abdominal distention.  Genitourinary: Denies dysuria, urgency, frequency, hematuria, flank pain and difficulty urinating.  Musculoskeletal: Has chronic back pain, neck pain, weakness, gait instability due to recent surgery Skin: Denies pallor, rash Neurological: Denies dizziness, seizures, syncope, weakness, light-headedness, numbness and headaches.  Hematological: Denies adenopathy. Easy bruising, personal or family bleeding history  Psychiatric/Behavioral: Denies suicidal ideation, mood changes, confusion, nervousness, sleep disturbance and agitation   Physical Exam: Blood pressure 123/77, pulse 106, temperature 100.1 F (37.8 C), temperature source Oral, resp. rate 18, height 5\' 10"  (1.778 m), weight 94.1 kg (207 lb 7.3 oz), SpO2 90.00%.  General: Alert and awake, oriented x3, not in any acute distress. HEENT: anicteric sclera, pupils reactive to light and accommodation, EOMI CVS: S1-S2 clear, no murmur rubs or gallops Chest: clear to auscultation bilaterally, no wheezing, rales or rhonchi Abdomen: soft nontender, nondistended, normal bowel sounds, no organomegaly Extremities: no cyanosis, clubbing. 1+ pitting edema up to the knees bilaterally Neuro: Cranial nerves II-XII intact, no focal neurological deficits  Labs on Admission:  Basic Metabolic Panel: No results found for this basename: NA:2,K:2,CL:2,CO2:2,GLUCOSE:2,BUN:2,CREATININE:2,CALCIUM:2,MG:1,PHOS:1 in  the last 168 hours CBC: No results found for this basename: WBC:2,NEUTROABS:1,HGB:2,HCT:2,MCV:2,PLT:2 in the last 168 hours Cardiac  Enzymes: No results found for this basename: CKTOTAL:3,CKMB:3,CKMBINDEX:3,TROPONINI:3 in the last 168 hours BNP: No components found with this basename: POCBNP:2 CBG:  Lab 07/28/11 1158 07/28/11 0640 07/27/11 2142 07/27/11 1641 07/27/11 1236  GLUCAP 170* 154* 189* 119* 227*    Inpatient Medications:   Scheduled Meds:   . amLODipine  10 mg Oral Daily  . bupivacaine liposome  20 mL Infiltration Once  . docusate sodium  100 mg Oral BID  . glipiZIDE  10 mg Oral QAC breakfast  . metFORMIN  1,000 mg Oral BID WC  . metoprolol succinate  25 mg Oral Daily  . quinapril  40 mg Oral Daily  . sodium chloride  3 mL Intravenous Q12H   Continuous Infusions:   . sodium chloride    . sodium chloride Stopped (07/28/11 0500)  . lactated ringers       Radiological Exams on Admission: No results found.  Impression/Recommendations 1. bilateral lower extremity edema: Unclear etiology for now,  - Obtain Doppler ultrasound to rule out DVT - He has a history of coronary artery disease, not on any diuretics or known history of CHF, obtain BNP, 2-D echo, given one dose of IV Lasix and reassess. I also counseled him to elevate his legs on the bed. He will likely benefit from elastic stockings. - Obtain pre-albumin and albumin level to rule out hypo-albuminemia, stop IV fluids, patient is tolerating diet.  2. diabetes mellitus: Non-insulin-dependent - Obtain HbA1c, continue metformin and glipizide, obtain renal panel - Placed on sliding scale insulin correction factor  3. Hypertension/CAD: - Asymptomatic except bilateral edema, continue beta blocker, Norvasc, ACE inhibitor.   4. lower extremity weakness: Has a history of arthritis, no neurological deficits, patient had a CT myelogram in March 2012 with mild to moderate stenosis in L2-3, L3-4, L5-S1, patient denies previous lumbar discectomy. Defer management to neurosurgery, will require aggressive physical therapy.  I will followup again in the  morning. Please contact me if I can be of assistance in the meanwhile. Thank you for this consultation.  Time Spent  45 minutes  RAI,RIPUDEEP 07/28/2011, 5:06 PM

## 2011-07-28 NOTE — Progress Notes (Signed)
Physical Therapy Treatment Patient Details Name: Christopher [name] MRN: [id number] DOB: August 11, 1941 Today's Date: 07/28/2011  PT Assessment/Plan  PT - Assessment/Plan Comments on Treatment Session: Pt states he feels weaker today & is experiencing new onset of foot swelling & increased pain in knees & feet.  Session limited today by pain & swelling per pt.  RN notified.   PT Plan: Discharge plan remains appropriate PT Frequency: Min 5X/week Follow Up Recommendations: Inpatient Rehab Equipment Recommended: Defer to next venue PT Goals  Acute Rehab PT Goals PT Goal: Sit to Stand - Progress: Not Progressing PT Goal: Stand to Sit - Progress: Not progressing  PT Treatment Precautions/Restrictions  Precautions Precautions: Fall Required Braces or Orthoses: No Restrictions Weight Bearing Restrictions: No Mobility (including Balance) Bed Mobility Bed Mobility:  (Pt sitting in Chair before & after session) Transfers Sit to Stand: With armrests;1: +2 Total assist;From chair/3-in-1;With upper extremity assist Sit to Stand Details (indicate cue type and reason): pt=50%; (A) to acheive standing, anterior wt shift over BOS, balance, advance hands from armrests of chair to RW, & safety.   Stand to Sit: 1: +2 Total assist;To chair/3-in-1;With upper extremity assist Stand to Sit Details: pt=50%; (A) to control descent & safety.  (A) to place Hands on armrests of chair.;  Pt unable to attempt again due to pain in knees & feet.  Pt also stating he feels much weaker today.   Ambulation/Gait Ambulation/Gait: No (not performed due to pain)  Static Standing Balance Static Standing - Balance Support: Bilateral upper extremity supported Static Standing - Level of Assistance: 1: +2 Total assist Static Standing - Comment/# of Minutes: pt=60%; (A) for balance & safety; Pt stating he can't stand any longer due to pain & increased weakness.  Pt stood at chair with bil UE supported on RW x 45 seconds.   Exercise    General Exercises - Lower Extremity Long Arc Quad: AROM;10 reps;Both;Seated Hip Flexion/Marching: AAROM;Both;10 reps;Seated Toe Raises: AROM;Both;Seated Heel Raises: AROM;Both;10 reps;Seated Other Exercises Other Exercises: Hamstring Curls with Manual Resistance applied x 10 reps; Both Legs.   End of Session PT - End of Session Equipment Utilized During Treatment: Gait belt Activity Tolerance: Patient limited by pain;Other (comment) (weakness per pt) Patient left: in chair;with call bell in reach;with family/visitor present Nurse Communication: Mobility status for transfers General Behavior During Session: [facility] for tasks performed Cognition: [facility] for tasks performed  Lara Mulch 07/28/2011, 12:48 PM [phone]

## 2011-07-28 NOTE — Progress Notes (Signed)
PRELIMINARY  PRELIMINARY  PRELIMINARY  PRELIMINARY  Bilateral lower extremity venous duplex completed.    Preliminary report:  Bilateral:  No evidence of DVT or superficial thrombosis.  Baker's cyst noted on the right.   Christopher [name] 07/28/2011 4:57 PM

## 2011-07-29 ENCOUNTER — Inpatient Hospital Stay (HOSPITAL_COMMUNITY): Payer: Medicare Other

## 2011-07-29 ENCOUNTER — Encounter (HOSPITAL_COMMUNITY): Payer: Self-pay | Admitting: Neurosurgery

## 2011-07-29 LAB — URINE MICROSCOPIC-ADD ON

## 2011-07-29 LAB — GLUCOSE, CAPILLARY
Glucose-Capillary: 122 mg/dL — ABNORMAL HIGH (ref 70–99)
Glucose-Capillary: 187 mg/dL — ABNORMAL HIGH (ref 70–99)

## 2011-07-29 LAB — BASIC METABOLIC PANEL
BUN: 20 mg/dL (ref 6–23)
Chloride: 93 mEq/L — ABNORMAL LOW (ref 96–112)
Glucose, Bld: 183 mg/dL — ABNORMAL HIGH (ref 70–99)
Potassium: 3.5 mEq/L (ref 3.5–5.1)

## 2011-07-29 LAB — CBC
HCT: 35.1 % — ABNORMAL LOW (ref 39.0–52.0)
Hemoglobin: 12 g/dL — ABNORMAL LOW (ref 13.0–17.0)
MCH: 31.5 pg (ref 26.0–34.0)
MCHC: 34.2 g/dL (ref 30.0–36.0)

## 2011-07-29 LAB — URINALYSIS, ROUTINE W REFLEX MICROSCOPIC
Glucose, UA: 100 mg/dL — AB
Protein, ur: 100 mg/dL — AB

## 2011-07-29 LAB — HEMOGLOBIN A1C: Mean Plasma Glucose: 166 mg/dL — ABNORMAL HIGH (ref <117)

## 2011-07-29 MED ORDER — AMITRIPTYLINE HCL 25 MG PO TABS
25.0000 mg | ORAL_TABLET | Freq: Every day | ORAL | Status: DC
  Administered 2011-07-29 – 2011-07-31 (×3): 25 mg via ORAL
  Filled 2011-07-29 (×4): qty 1

## 2011-07-29 MED ORDER — FUROSEMIDE 10 MG/ML IJ SOLN
40.0000 mg | Freq: Once | INTRAMUSCULAR | Status: AC
  Administered 2011-07-29: 40 mg via INTRAVENOUS
  Filled 2011-07-29: qty 4

## 2011-07-29 MED ORDER — METOPROLOL SUCCINATE ER 100 MG PO TB24
100.0000 mg | ORAL_TABLET | Freq: Every day | ORAL | Status: DC
  Administered 2011-07-30 – 2011-08-01 (×3): 100 mg via ORAL
  Filled 2011-07-29 (×3): qty 1

## 2011-07-29 NOTE — Progress Notes (Signed)
Patient ID: Christopher [name], male   DOB: 1940/10/08, 70 y.o.   MRN: [id number] Seen by hospitalists.c/o of edema in lower extremities plus burning sensation. DVT RULED OUT. HAS A HISTORY OF DIABETIC NEUROPATHY. WOUND WITH LESS SWELLING.to get rehabiolitation to see him.

## 2011-07-29 NOTE — Progress Notes (Signed)
Occupational Therapy Treatment Patient Details Name: Christopher [name] MRN: [id number] DOB: 26-May-1941 Today's Date: 07/29/2011  OT Assessment/Plan OT Assessment/Plan Comments on Treatment Session: Patient greatly limited by UE and LE pain and weakness OT Plan: Discharge plan remains appropriate Follow Up Recommendations: Inpatient Rehab Equipment Recommended: Defer to next venue OT Goals ADL Goals ADL Goal: Toilet Transfer - Progress: Progressing toward goals  OT Treatment Precautions/Restrictions  Precautions Precautions: Fall Required Braces or Orthoses: No   ADL ADL Toilet Transfer: Simulated;+2 Total assistance (pt=40%) Toilet Transfer Details (indicate cue type and reason): Simulated EOB to chair placed perpendicular to bed. Patient required Max encouragement as well as assist to weight shift left/right to unweight contralateral limb. Also, required assist >50% of transfer to adavance bilateral LEs.  Toilet Transfer Method: Stand pivot ADL Comments: Pt with decreased I with ADLs and functional transfers due to generalized weakness and pain Mobility  Transfers Sit to Stand: 1: +2 Total assist;From bed;With upper extremity assist (pt=40%) Sit to Stand Details (indicate cue type and reason): VC for hand placement. Tactile cues and physical assist to extend hip and anteriorly shift weight to avoid falling backward Stand to Sit: 1: +2 Total assist;To chair/3-in-1;With armrests (pt=50%) Stand to Sit Details: VC for hand placement and assist to control descent Exercises General Exercises - Upper Extremity Shoulder Flexion: PROM;10 reps;Right;Left;Seated Elbow Flexion: AROM;10 reps;Seated;Right;Left Elbow Extension: AROM;10 reps;Right;Left;Seated  End of Session OT - End of Session Equipment Utilized During Treatment: Gait belt Activity Tolerance: Patient limited by pain Patient left: in chair;with call bell in reach;with family/visitor present Nurse Communication: Mobility  status for transfers;Mobility status for ambulation General Behavior During Session: [facility] for tasks performed Cognition: [facility] for tasks performed  Dmya Long  07/29/2011, 2:37 PM

## 2011-07-29 NOTE — Progress Notes (Signed)
Physical Therapy Treatment Patient Details Name: Christopher [name] MRN: [id number] DOB: [birth year] Today's Date: 07/29/2011  PT Assessment/Plan  PT - Assessment/Plan Comments on Treatment Session: Pt c/o significant weakness & pain in LE's (knees down to feet).- evident in the decreased ability to mobilize & requiring increased (A).   PT Plan: Discharge plan remains appropriate PT Frequency: Min 5X/week Follow Up Recommendations: Inpatient Rehab Equipment Recommended: Defer to next venue PT Goals  Acute Rehab PT Goals PT Goal: Sit to Stand - Progress: Not met PT Goal: Stand to Sit - Progress: Not met PT Transfer Goal: Bed to Chair/Chair to Bed - Progress: Not met  PT Treatment Precautions/Restrictions  Precautions Precautions: Fall Required Braces or Orthoses: No Restrictions Weight Bearing Restrictions: No Mobility (including Balance) Transfers Sit to Stand: 1: +2 Total assist;From bed;With upper extremity assist;With armrests Sit to Stand Details (indicate cue type and reason): Cues for hand placement & technique; (A) to achieve standing, extend hips & facilitate anterior translation of trunk over BOS, prevention of falling backwards.  pt=40% Stand to Sit: 1: +2 Total assist;With armrests;To chair/3-in-1 Stand to Sit Details: cues for hand placement & use of UE's to (A) with controlled descent.  (A) for safety & control.   Stand Pivot Transfers: 1: +2 Total assist Stand Pivot Transfer Details (indicate cue type and reason): pt=40%; (A) for balance, management of RW, lateral wt shifting, rotation of trunk/hips to recliner; pt with significant difficulty manuevering LE's & performing hip/knee flexion to achieve floor clearance.   Ambulation/Gait Ambulation/Gait: No    Exercise  General Exercises - Upper Extremity Shoulder Flexion: PROM;10 reps;Right;Left;Seated Elbow Flexion: AROM;10 reps;Seated;Right;Left Elbow Extension: AROM;10 reps;Right;Left;Seated General Exercises - Lower  Extremity Long Arc Quad: Strengthening;Both;10 reps;Seated Hip Flexion/Marching: Strengthening;Both;10 reps;Seated Toe Raises: Strengthening;Both;10 reps;Seated Heel Raises: Strengthening;Both;10 reps;Seated End of Session PT - End of Session Equipment Utilized During Treatment: Gait belt Activity Tolerance: Patient limited by pain (& weakness) Patient left: in chair;with call bell in reach;with family/visitor present Nurse Communication: Mobility status for transfers;Need for lift equipment General Behavior During Session: [facility] for tasks performed Cognition: [facility] for tasks performed  Lara Mulch 07/29/2011, 3:00 PM [phone]

## 2011-07-29 NOTE — Progress Notes (Signed)
Subjective: Patient complains of continued pain and swelling in the lower extremity.  He has used Gabapentin in the past without relief.  Objective: Vital signs in last 24 hours: Temp:  [98 F (36.7 C)-100.4 F (38 C)] 100 F (37.8 C) (12/18 1450) Pulse Rate:  [74-125] 109  (12/18 1450) Resp:  [18-20] 18  (12/18 1450) BP: (123-158)/(73-90) 129/77 mmHg (12/18 1450) SpO2:  [90 %-98 %] 90 % (12/18 1450) Weight change:   -Intake/Output from previous day: 12/17 0701 - 12/18 0700 In: 100 [P.O.:100] Out: -   Blood pressure 129/77, pulse 109, temperature 100 F (37.8 C), temperature source Oral, resp. rate 18, height 5\' 10"  (1.778 m), weight 94.1 kg (207 lb 7.3 oz), SpO2 90.00%. HEENT: normal Cardio: RRR Resp: CTA B/L GI: BS positive Extremity:  Pulses positive, 1 plus edema.  Lab Results: Lab Results  Component Value Date   WBC 16.4* 07/29/2011   HGB 12.0* 07/29/2011   HCT 35.1* 07/29/2011   MCV 92.1 07/29/2011   PLT 189 07/29/2011    BMET  Lab 07/29/11 0625  NA 134*  K 3.5  CL 93*  CO2 24  BUN 20  CREATININE 1.19  LABGLOM --  GLUCOSE 183*  CALCIUM 8.6    Studies/Results: No results found.  Medications:  Current Facility-Administered Medications  Medication Dose Route Frequency Provider Last Rate Last Dose  . 0.9 %  sodium chloride infusion  250 mL Intravenous Continuous [name] Botero      . acetaminophen (TYLENOL) tablet 650 mg  650 mg Oral Q4H PRN [name] Botero   650 mg at 07/29/11 1516   Or  . acetaminophen (TYLENOL) suppository 650 mg  650 mg Rectal Q4H PRN Karn Cassis      . amLODipine (NORVASC) tablet 10 mg  10 mg Oral Daily [name] Botero   10 mg at 07/29/11 1024  . bisacodyl (DULCOLAX) suppository 10 mg  10 mg Rectal Daily PRN Karn Cassis      . bupivacaine liposome (EXPAREL) 1.3 % injection 266 mg  20 mL Infiltration Once Karn Cassis      . docusate sodium (COLACE) capsule 100 mg  100 mg Oral BID [name] Botero   100 mg at  07/29/11 1027  . furosemide (LASIX) injection 40 mg  40 mg Intravenous Once Ripudeep K Rai, MD   40 mg at 07/28/11 1910  . furosemide (LASIX) injection 40 mg  40 mg Intravenous Once [employer]      . glipiZIDE (GLUCOTROL) tablet 10 mg  10 mg Oral QAC breakfast [name] Botero   10 mg at 07/29/11 1026  . HYDROmorphone (DILAUDID) tablet 2 mg  2 mg Oral Q2H PRN Dorian Heckle, MD   2 mg at 07/29/11 1412  . insulin aspart (novoLOG) injection 0-15 Units  0-15 Units Subcutaneous TID WC Ripudeep Jenna Luo, MD   5 Units at 07/29/11 1352  . insulin aspart (novoLOG) injection 0-5 Units  0-5 Units Subcutaneous QHS Ripudeep K Rai, MD      . menthol-cetylpyridinium (CEPACOL) lozenge 3 mg  1 lozenge Oral PRN [name] Botero   3 mg at 07/26/11 0245   Or  . phenol (CHLORASEPTIC) mouth spray 1 spray  1 spray Mouth/Throat PRN Karn Cassis      . metFORMIN (GLUCOPHAGE) tablet 1,000 mg  1,000 mg Oral BID WC [name] Botero   1,000 mg at 07/29/11 1026  . metoprolol (TOPROL-XL) 24 hr tablet 50 mg  50 mg Oral Daily Ripudeep Jenna Luo, MD   50 mg at 07/29/11 1024  . morphine (MSIR) tablet 15 mg  15 mg Oral Q4H PRN Dorian Heckle, MD   15 mg at 07/28/11 1217  . morphine 4 MG/ML injection 1-4 mg  1-4 mg Intravenous Q4H PRN [name] Botero   2 mg at 07/27/11 1117  . ondansetron (ZOFRAN) injection 4 mg  4 mg Intravenous Q4H PRN [name] Botero      . oxyCODONE-acetaminophen (PERCOCET) 5-325 MG per tablet 1-2 tablet  1-2 tablet Oral Q4H PRN [name] Botero   2 tablet at 07/26/11 0418  . quinapril (ACCUPRIL) tablet 40 mg  40 mg Oral Daily [name] Botero   40 mg at 07/29/11 1024  . sodium chloride 0.9 % injection 3 mL  3 mL Intravenous Q12H [name] Botero   3 mL at 07/29/11 1000  . sodium chloride 0.9 % injection 3 mL  3 mL Intravenous PRN [name] Botero      . zolpidem (AMBIEN) tablet 5 mg  5 mg Oral QHS PRN [name] Botero      . DISCONTD: 0.9 %  sodium chloride infusion   Intravenous Continuous [name]  Botero      . DISCONTD: lactated ringers infusion   Intravenous Continuous Josepha Pigg, MD      . DISCONTD: metoprolol succinate (TOPROL-XL) 24 hr tablet 25 mg  25 mg Oral Daily [name] Botero   25 mg at 07/28/11 1013    Assessment/Plan: 1. bilateral lower extremity edema:  Unclear etiology for now,  -  Doppler ultrasound negative for DVT  - He has a history of coronary artery disease, no history of CHF, , 2-D echo pending, given another dose of IV Lasix and  Discontinue IV fluids, elevate his legs on the bed.  D/c Norvasc. He will likely benefit from ted hose   2. diabetes mellitus:  Non-insulin-dependent  - continue metformin and glipizide, obtain renal panel   3. Hypertension/CAD:  - Asymptomatic except bilateral edema,increase beta blocker, d/cNorvasc, continue ACE inhibitor.   4. 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 Defer management to neurosurgery,   Try low dose Elavil for the lower extremity pain and check B12 levels.    Low Grade Fever/ Elevated White Count: Check UA and CXR.    LOS: 4 days   Earlene Plater MD, Ladell Pier 07/29/2011, 4:26 PM

## 2011-07-30 DIAGNOSIS — M4712 Other spondylosis with myelopathy, cervical region: Secondary | ICD-10-CM

## 2011-07-30 DIAGNOSIS — I517 Cardiomegaly: Secondary | ICD-10-CM

## 2011-07-30 LAB — CBC
HCT: 34.8 % — ABNORMAL LOW (ref 39.0–52.0)
Hemoglobin: 11.8 g/dL — ABNORMAL LOW (ref 13.0–17.0)
MCH: 31.2 pg (ref 26.0–34.0)
MCHC: 33.9 g/dL (ref 30.0–36.0)

## 2011-07-30 LAB — BASIC METABOLIC PANEL
BUN: 29 mg/dL — ABNORMAL HIGH (ref 6–23)
Chloride: 93 mEq/L — ABNORMAL LOW (ref 96–112)
GFR calc Af Amer: 68 mL/min — ABNORMAL LOW (ref >90)
Glucose, Bld: 134 mg/dL — ABNORMAL HIGH (ref 70–99)
Potassium: 3.2 mEq/L — ABNORMAL LOW (ref 3.5–5.1)

## 2011-07-30 LAB — GLUCOSE, CAPILLARY: Glucose-Capillary: 136 mg/dL — ABNORMAL HIGH (ref 70–99)

## 2011-07-30 MED ORDER — FUROSEMIDE 20 MG PO TABS
20.0000 mg | ORAL_TABLET | Freq: Every day | ORAL | Status: DC
  Administered 2011-07-30: 20 mg via ORAL
  Filled 2011-07-30 (×2): qty 1

## 2011-07-30 MED ORDER — ENOXAPARIN SODIUM 40 MG/0.4ML ~~LOC~~ SOLN
40.0000 mg | SUBCUTANEOUS | Status: DC
  Administered 2011-07-30 – 2011-07-31 (×2): 40 mg via SUBCUTANEOUS
  Filled 2011-07-30 (×3): qty 0.4

## 2011-07-30 MED ORDER — POTASSIUM CHLORIDE CRYS ER 20 MEQ PO TBCR
30.0000 meq | EXTENDED_RELEASE_TABLET | Freq: Two times a day (BID) | ORAL | Status: AC
  Administered 2011-07-30: 30 meq via ORAL
  Filled 2011-07-30: qty 1

## 2011-07-30 MED ORDER — POTASSIUM CHLORIDE CRYS ER 20 MEQ PO TBCR
60.0000 meq | EXTENDED_RELEASE_TABLET | Freq: Two times a day (BID) | ORAL | Status: DC
  Administered 2011-07-30: 60 meq via ORAL
  Filled 2011-07-30 (×2): qty 3

## 2011-07-30 NOTE — Progress Notes (Signed)
Subjective: Swelling in both legs are significantly improved, low-grade fever but denies any cough congestion or sore throat  Objective: Vital signs in last 24 hours: Filed Vitals:   07/29/11 2200 07/30/11 0200 07/30/11 0600 07/30/11 1025  BP: 131/82 88/66 167/93 153/89  Pulse: 107 120 114 94  Temp: 99.1 F (37.3 C) 98 F (36.7 C) 99.1 F (37.3 C) 98.2 F (36.8 C)  TempSrc:    Oral  Resp: 18 18 18 18   Height:      Weight:      SpO2: 90% 92% 97% 97%    Intake/Output Summary (Last 24 hours) at 07/30/11 1204 Last data filed at 07/30/11 0700  Gross per 24 hour  Intake      0 ml  Output    800 ml  Net   -800 ml    Weight change:   HEENT: normal  Cardio: RRR  Resp: CTA B/L  GI: BS positive  Extremity: Pulses positive, 1 plus edema.   Lab Results: Results for orders placed during the hospital encounter of 07/25/11 (from the past 24 hour(s))  GLUCOSE, CAPILLARY     Status: Abnormal   Collection Time   07/29/11  4:44 PM      Component Value Range   Glucose-Capillary 146 (*) 70 - 99 (mg/dL)  URINALYSIS, ROUTINE W REFLEX MICROSCOPIC     Status: Abnormal   Collection Time   07/29/11  4:44 PM      Component Value Range   Color, Urine YELLOW  YELLOW    APPearance CLEAR  CLEAR    Specific Gravity, Urine 1.022  1.005 - 1.030    pH 5.5  5.0 - 8.0    Glucose, UA 100 (*) NEGATIVE (mg/dL)   Hgb urine dipstick SMALL (*) NEGATIVE    Bilirubin Urine SMALL (*) NEGATIVE    Ketones, ur 15 (*) NEGATIVE (mg/dL)   Protein, ur 960 (*) NEGATIVE (mg/dL)   Urobilinogen, UA 1.0  0.0 - 1.0 (mg/dL)   Nitrite NEGATIVE  NEGATIVE    Leukocytes, UA NEGATIVE  NEGATIVE   URINE MICROSCOPIC-ADD ON     Status: Abnormal   Collection Time   07/29/11  4:44 PM      Component Value Range   Squamous Epithelial / LPF FEW (*) RARE    WBC, UA 3-6  <3 (WBC/hpf)   RBC / HPF 0-2  <3 (RBC/hpf)   Bacteria, UA FEW (*) RARE    Casts GRANULAR CAST (*) NEGATIVE    Urine-Other MUCOUS PRESENT    VITAMIN B12      Status: Abnormal   Collection Time   07/29/11  4:48 PM      Component Value Range   Vitamin B-12 176 (*) 211 - 911 (pg/mL)  GLUCOSE, CAPILLARY     Status: Abnormal   Collection Time   07/29/11 10:18 PM      Component Value Range   Glucose-Capillary 115 (*) 70 - 99 (mg/dL)  BASIC METABOLIC PANEL     Status: Abnormal   Collection Time   07/30/11  5:50 AM      Component Value Range   Sodium 136  135 - 145 (mEq/L)   Potassium 3.2 (*) 3.5 - 5.1 (mEq/L)   Chloride 93 (*) 96 - 112 (mEq/L)   CO2 32  19 - 32 (mEq/L)   Glucose, Bld 134 (*) 70 - 99 (mg/dL)   BUN 29 (*) 6 - 23 (mg/dL)   Creatinine, Ser 4.54  0.50 - 1.35 (mg/dL)  Calcium 9.0  8.4 - 10.5 (mg/dL)   GFR calc non Af Amer 59 (*) >90 (mL/min)   GFR calc Af Amer 68 (*) >90 (mL/min)  CBC     Status: Abnormal   Collection Time   07/30/11  5:50 AM      Component Value Range   WBC 15.1 (*) 4.0 - 10.5 (K/uL)   RBC 3.78 (*) 4.22 - 5.81 (MIL/uL)   Hemoglobin 11.8 (*) 13.0 - 17.0 (g/dL)   HCT 47.8 (*) 29.5 - 52.0 (%)   MCV 92.1  78.0 - 100.0 (fL)   MCH 31.2  26.0 - 34.0 (pg)   MCHC 33.9  30.0 - 36.0 (g/dL)   RDW 62.1  30.8 - 65.7 (%)   Platelets 215  150 - 400 (K/uL)  GLUCOSE, CAPILLARY     Status: Abnormal   Collection Time   07/30/11  6:28 AM      Component Value Range   Glucose-Capillary 136 (*) 70 - 99 (mg/dL)     Micro: No results found for this or any previous visit (from the past 240 hour(s)).  Studies/Results: Dg Chest 2 View  07/29/2011  * IMPRESSION: Suboptimal inspiration.  No acute cardiopulmonary disease.  Stable chronic elevation of the right hemidiaphragm and associated chronic atelectasis/scarring in the right lower lobe.  Original Report Authenticated By: Arnell Sieving, M.D.    Medications:     . amitriptyline  25 mg Oral QHS  . bupivacaine liposome  20 mL Infiltration Once  . docusate sodium  100 mg Oral BID  . furosemide  40 mg Intravenous Once  . glipiZIDE  10 mg Oral QAC breakfast  .  insulin aspart  0-15 Units Subcutaneous TID WC  . insulin aspart  0-5 Units Subcutaneous QHS  . metFORMIN  1,000 mg Oral BID WC  . metoprolol succinate  100 mg Oral Daily  . potassium chloride  30 mEq Oral BID  . quinapril  40 mg Oral Daily  . sodium chloride  3 mL Intravenous Q12H  . DISCONTD: amLODipine  10 mg Oral Daily  . DISCONTD: metoprolol succinate  50 mg Oral Daily  . DISCONTD: potassium chloride  60 mEq Oral BID    Assessment:  1. bilateral lower extremity edema:  Unclear etiology for now,  - Doppler ultrasound negative for DVT  - He has a history of coronary artery disease, no history of CHF, , 2-D echo pending, given another dose of IV Lasix and Discontinue IV fluids, elevate his legs on the bed. D/c Norvasc. He will likely benefit from ted hose . We will continue by mouth Lasix, check a pro BNP 2. diabetes mellitus:  Non-insulin-dependent  - continue metformin and glipizide, obtain renal panel  3. Hypertension/CAD:  - Asymptomatic except bilateral edema,increase beta blocker, d/cNorvasc, continue ACE inhibitor.  4. 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 Defer management to neurosurgery, Try low dose Elavil for the lower extremity pain and check B12 levels.  Okay to DC to CIR when bed available  LOS: 5 days   [facility] 07/30/2011, 12:04 PM

## 2011-07-30 NOTE — Consult Note (Signed)
Physical Medicine and Rehabilitation Consult Reason for Consult: Cervical stenosis with myelopathy/radiculopathy Referring Phsyician: Dr. Reginal Lutes Christopher [name] is an 70 y.o. male.   HPI: 70 year old right-handed male with history of cervical stenosis and fusion in the past of cervical 4-7. Admitted December 14 with progressive weakness in both upper extremities. X-ray and imaging showed severe stenosis with radiculopathy at the level of cervical 3-4. The area between cervical 4 and cervical 7 looked good from prior surgery. Underwent bilateral cervical 3-4 laminectomy with decompression of spinal cord, bilateral foraminotomies on December 14 by Dr. Jeral Fruit. Postoperative pain control with morphine/MSIR as needed. Bilateral lower tremor the Doppler study completed on December 17 that were negative for deep vein thrombosis. Latest physical therapy notes on December 18 as patient is total assist of 2 for stand to sit and total assist for stand pivot transfers. Ambulation yet to be tested. Physical medicine and rehabilitation was consulted at the request of physical therapy to consider inpatient rehabilitation services Review of Systems  Constitutional: Negative for fever and malaise/fatigue.  Eyes: Negative for blurred vision.  Respiratory: Negative for cough and shortness of breath.   Cardiovascular: Negative for chest pain.  Gastrointestinal: Positive for constipation. Negative for nausea.  Genitourinary: Negative for dysuria.  Musculoskeletal: Positive for back pain.  Skin: Negative.   Neurological: Negative for dizziness and headaches.  Psychiatric/Behavioral: The patient has insomnia.    Past Medical History  Diagnosis Date  . Coronary artery disease   . Arthritis   . oral control   . 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 Hypertension     stress test done    [facility] in [place]  . Myocardial infarction     ,2003,     . Anxiety   . 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 Back surgery   . Coronary angioplasty     2003      [facility]  . Joint replacement left total knee 2005    both shoulders  . Posterior cervical fusion/foraminotomy 07/25/2011    Procedure: POSTERIOR CERVICAL FUSION/FORAMINOTOMY LEVEL 1;  Surgeon: Karn Cassis;  Location: [facility];  Service: Neurosurgery;  Laterality: N/A;  Cervical Three-Four Laminectomy, Lateral Mass screws   History reviewed. No pertinent family history. Social History:  reports that he quit smoking about 26 years ago. He does not have any smokeless tobacco history on file. He reports that he drinks alcohol. He reports that he does not use illicit drugs. Allergies:  Allergies  Allergen Reactions  . Neurontin (Gabapentin) Other (See Comments)    Unknown reaction   Medications Prior to Admission  Medication Dose Route Frequency Provider Last Rate Last Dose  . 0.9 %  sodium chloride infusion  250 mL Intravenous Continuous [name] Botero      . acetaminophen (TYLENOL) tablet 650 mg  650 mg Oral Q4H PRN [name] Botero   650 mg at 07/29/11 1516   Or  . acetaminophen (TYLENOL) suppository 650 mg  650 mg Rectal Q4H PRN Karn Cassis      . amitriptyline (ELAVIL) tablet 25 mg  25 mg Oral QHS [name] Botero   25 mg at 07/29/11 2318  . bisacodyl (DULCOLAX) suppository 10 mg  10 mg Rectal Daily PRN Karn Cassis      . bupivacaine liposome (EXPAREL) 1.3 % injection 266 mg  20 mL Infiltration Once Karn Cassis      . ceFAZolin (ANCEF) 2-3 GM-% IVPB SOLR           . ceFAZolin (ANCEF) IVPB 1 g/50 mL premix  1 g Intravenous Q8H Ernesto M Botero   1 g at 07/26/11 0836  . docusate sodium (COLACE) capsule 100 mg  100 mg Oral BID [name] Botero   100 mg at 07/29/11 2318  . furosemide (LASIX) injection 40 mg  40 mg Intravenous Once Ripudeep K Rai, MD   40 mg at 07/28/11 1910  . furosemide (LASIX) injection 40 mg  40  mg Intravenous Once [name] Botero   40 mg at 07/29/11 1728  . glipiZIDE (GLUCOTROL) tablet 10 mg  10 mg Oral QAC breakfast [name] Botero   10 mg at 07/29/11 1026  . HYDROmorphone (DILAUDID) 1 MG/ML injection           . HYDROmorphone (DILAUDID) tablet 2 mg  2 mg Oral Q2H PRN Dorian Heckle, MD   2 mg at 07/29/11 1412  . influenza  inactive virus vaccine (FLUZONE/FLUARIX) injection 0.5 mL  0.5 mL Intramuscular Tomorrow-1000 [name] Botero   0.5 mL at 07/27/11 1058  . insulin aspart (novoLOG) injection 0-15 Units  0-15 Units Subcutaneous TID WC Ripudeep Jenna Luo, MD   2 Units at 07/29/11 1727  . insulin aspart (novoLOG) injection 0-5 Units  0-5 Units Subcutaneous QHS Ripudeep K Rai, MD      . menthol-cetylpyridinium (CEPACOL) lozenge 3 mg  1 lozenge Oral PRN [name] Botero   3 mg at 07/26/11 0245   Or  . phenol (CHLORASEPTIC) mouth spray 1 spray  1 spray Mouth/Throat PRN Karn Cassis      . metFORMIN (GLUCOPHAGE) tablet 1,000 mg  1,000 mg Oral BID WC [name] Botero   1,000 mg at 07/29/11 1728  . metoprolol (TOPROL-XL) 24 hr tablet 100 mg  100 mg Oral Daily [name] Botero      . morphine (MSIR) tablet 15 mg  15 mg Oral Q4H PRN Dorian Heckle, MD   15 mg at 07/28/11 1217  . morphine 4 MG/ML injection 1-4 mg  1-4 mg Intravenous Q4H PRN [name] Botero   2 mg at 07/27/11 1117  . ondansetron (ZOFRAN) injection 4 mg  4 mg Intravenous Q4H PRN [name] Botero      . oxyCODONE-acetaminophen (PERCOCET) 5-325 MG per tablet 1-2 tablet  1-2 tablet Oral Q4H PRN [name] Botero   2 tablet at 07/26/11 0418  . quinapril (ACCUPRIL) tablet 40 mg  40 mg Oral Daily [name] Botero   40 mg at 07/29/11 1024  . sodium chloride 0.9 % injection 3 mL  3 mL Intravenous Q12H [name] Botero   3 mL at 07/29/11 2255  . sodium chloride 0.9 % injection 3 mL  3 mL Intravenous PRN [name] Botero   3 mL at 07/29/11 2318  . white petrolatum (VASELINE) gel           . zolpidem (AMBIEN) tablet 5 mg  5 mg Oral QHS  PRN Karn Cassis      . DISCONTD: 0.9 %  sodium chloride infusion   Intravenous Continuous [name] Botero      . DISCONTD: 0.9 % irrigation (POUR BTL)    PRN [name] Botero   1,000 mL at 07/25/11 1702  . DISCONTD: amLODipine (NORVASC) tablet 10 mg  10 mg Oral Daily  [name] Botero   10 mg at 07/29/11 1024  . DISCONTD: bupivacaine liposome (EXPAREL) 1.3 % injection    PRN [name] Botero   20 mL at 07/25/11 1809  . DISCONTD: bupivacaine-EPINEPHrine (MARCAINE W/ EPI) 0.5 % (with pres) injection    PRN [name] Botero   30 mL at 07/25/11 1703  . DISCONTD: ceFAZolin (ANCEF) IVPB 1 g/50 mL premix  1 g Intravenous 60 min Pre-Op [name] Botero      . DISCONTD: ceFAZolin (ANCEF) IVPB 2 g/50 mL premix  2 g Intravenous 60 min Pre-Op [name] Botero      . DISCONTD: hemostatic agents    PRN [name] Botero   1 application at 07/25/11 1705  . DISCONTD: HYDROmorphone (DILAUDID) injection 0.25-0.5 mg  0.25-0.5 mg Intravenous Q5 min PRN Josepha Pigg, MD   0.5 mg at 07/25/11 2127  . DISCONTD: lactated ringers infusion   Intravenous Continuous Josepha Pigg, MD      . DISCONTD: meperidine (DEMEROL) injection 6.25-12.5 mg  6.25-12.5 mg Intravenous Q5 min PRN Josepha Pigg, MD      . DISCONTD: metoprolol (TOPROL-XL) 24 hr tablet 50 mg  50 mg Oral Daily Ripudeep Jenna Luo, MD   50 mg at 07/29/11 1024  . DISCONTD: metoprolol succinate (TOPROL-XL) 24 hr tablet 25 mg  25 mg Oral Daily [name] Botero   25 mg at 07/28/11 1013  . DISCONTD: povidone-iodine (BETADINE) 10 % ointment    PRN [name] Botero   1 application at 07/25/11 1703  . DISCONTD: promethazine (PHENERGAN) injection 6.25-12.5 mg  6.25-12.5 mg Intravenous Q15 min PRN Josepha Pigg, MD      . DISCONTD: thrombin kit 5000 units    PRN [name] Botero   5,000 Units at 07/25/11 1705   Medications Prior to Admission  Medication Sig Dispense Refill  . amLODipine (NORVASC) 10 MG tablet Take 10 mg by mouth daily.         Marland Kitchen aspirin EC 81 MG tablet Take 81 mg by mouth daily.        Marland Kitchen glipiZIDE (GLUCOTROL) 10 MG tablet Take 10 mg by mouth daily.        . metFORMIN (GLUCOPHAGE) 1000 MG tablet Take 1,000 mg by mouth 2 (two) times daily with a meal.        . metoprolol succinate (TOPROL-XL) 25 MG 24 hr tablet Take 25 mg by mouth daily.        . Multiple Vitamin (MULTIVITAMIN) tablet Take 1 tablet by mouth daily.        . Omega-3 Fatty Acids (FISH OIL) 1200 MG CAPS Take 1 capsule by mouth daily.        . quinapril (ACCUPRIL) 40 MG tablet Take 40 mg by mouth daily.          Home: Home Living Lives With: Spouse Receives Help From: Family Type of Home: House Home Layout: Two level;Able to live on main level with bedroom/bathroom Home Access: Stairs to enter Entrance Stairs-Rails: Right Entrance Stairs-Number of Steps: 1 Bathroom Shower/Tub: Walk-in Contractor: Standard Home Adaptive Equipment: Straight cane  Functional History: Prior Function Level of Independence: Requires assistive device for independence;Needs assistance with ADLs;Needs assistance with homemaking;Needs assistance with gait Driving: No Functional Status:  Mobility: Bed Mobility Bed Mobility:  (Pt sitting in Chair before & after session) Transfers Transfers: Yes Sit to Stand: 1: +2 Total assist;From bed;With upper extremity assist;With armrests Sit to Stand Details (indicate  cue type and reason): Cues for hand placement & technique; (A) to achieve standing, extend hips & facilitate anterior translation of trunk over BOS, prevention of falling backwards.  pt=40% Stand to Sit: 1: +2 Total assist;With armrests;To chair/3-in-1 Stand to Sit Details: cues for hand placement & use of UE's to (A) with controlled descent.  (A) for safety & control.   Stand Pivot Transfers: 1: +2 Total assist Stand Pivot Transfer Details (indicate cue type and reason): pt=40%; (A) for balance, management of RW, lateral wt shifting, rotation  of trunk/hips to recliner; pt with significant difficulty manuevering LE's & performing hip/knee flexion to achieve floor clearance.   Ambulation/Gait Ambulation/Gait: No Ambulation/Gait Assistance: 1: +2 Total assist;Patient percentage (comment) (Patient = 80 % (+2 assist for safety)) Ambulation/Gait Assistance Details (indicate cue type and reason): Decreased coordination of LE's during gait.  Right knee buckling during gait.  Some difficulty holding RW with hands.  Cues to focus on trunk and knee extension during stance. Ambulation Distance (Feet): 62 Feet Assistive device: Rolling walker Gait Pattern: Step-through pattern;Decreased stride length;Left flexed knee in stance;Right flexed knee in stance;Trunk flexed Stairs: No    ADL: ADL Grooming: Simulated;Minimal assistance Grooming Details (indicate cue type and reason): Min assist due to decrease strength/AROM in bil UE Where Assessed - Grooming: Sitting, chair;Supported Upper Body Bathing: Simulated;Minimal assistance Where Assessed - Upper Body Bathing: Sitting, chair;Supported Lower Body Bathing: Simulated;Minimal assistance Where Assessed - Lower Body Bathing: Sit to stand from chair;Supported Location manager Dressing: Simulated;Minimal assistance Where Assessed - Upper Body Dressing: Sitting, chair;Supported Lower Body Dressing: Minimal assistance;Simulated Where Assessed - Lower Body Dressing: Sit to stand from chair;Supported Statistician: Simulated;+2 Total assistance (pt=40%) Statistician Details (indicate cue type and reason): Simulated EOB to chair placed perpendicular to bed. Patient required Max encouragement as well as assist to weight shift left/right to unweight contralateral limb. Also, required assist >50% of transfer to adavance bilateral LEs.  Toilet Transfer Method: Stand pivot Equipment Used: Rolling walker ADL Comments: Pt with decreased I with ADLs and functional transfers due to generalized weakness and  pain  Cognition: Cognition Arousal/Alertness: Awake/alert Orientation Level: Oriented X4 Cognition Arousal/Alertness: Awake/alert Overall Cognitive Status: Appears within functional limits for tasks assessed Orientation Level: Oriented X4  Blood pressure 88/66, pulse 120, temperature 98 F (36.7 C), temperature source Oral, resp. rate 18, height 5\' 10"  (1.778 m), weight 94.1 kg (207 lb 7.3 oz), SpO2 92.00%. Physical Exam  Constitutional: He is oriented to person, place, and time. He appears well-developed.  HENT:  Head: Normocephalic.  Neck: No thyromegaly present.  Cardiovascular: Normal rate and regular rhythm.   Pulmonary/Chest: Breath sounds normal. He has no wheezes.  Abdominal: He exhibits no distension. There is no tenderness.  Musculoskeletal: He exhibits no edema.  Neurological: He is alert and oriented to person, place, and time.       Deltoid strength to plus. Biceps 3-3+ triceps 3-3+ wrist extensors 3+ hand intrinsics 3. Hip flexors 2+ knee extensors 3+ to 4 ankle dorsiflexors plantar flexors 4/5. Sensation diminished in both hands as well as throughout the distal aspects of the legs in a stocking glove distribution from the knees downward. Reflexes are grossly 1+ throughout. Cognitively patient is generally alert and appropriate. Cranial nerve exam is intact.  Skin:       Surgical site clean and dry  Psychiatric: His behavior is normal.    Results for orders placed during the hospital encounter of 07/25/11 (from the past 24 hour(s))  BASIC METABOLIC  PANEL     Status: Abnormal   Collection Time   07/29/11  6:25 AM      Component Value Range   Sodium 134 (*) 135 - 145 (mEq/L)   Potassium 3.5  3.5 - 5.1 (mEq/L)   Chloride 93 (*) 96 - 112 (mEq/L)   CO2 24  19 - 32 (mEq/L)   Glucose, Bld 183 (*) 70 - 99 (mg/dL)   BUN 20  6 - 23 (mg/dL)   Creatinine, Ser 1.61  0.50 - 1.35 (mg/dL)   Calcium 8.6  8.4 - 09.6 (mg/dL)   GFR calc non Af Amer 61 (*) >90 (mL/min)   GFR calc  Af Amer 71 (*) >90 (mL/min)  CBC     Status: Abnormal   Collection Time   07/29/11  6:25 AM      Component Value Range   WBC 16.4 (*) 4.0 - 10.5 (K/uL)   RBC 3.81 (*) 4.22 - 5.81 (MIL/uL)   Hemoglobin 12.0 (*) 13.0 - 17.0 (g/dL)   HCT 04.5 (*) 40.9 - 52.0 (%)   MCV 92.1  78.0 - 100.0 (fL)   MCH 31.5  26.0 - 34.0 (pg)   MCHC 34.2  30.0 - 36.0 (g/dL)   RDW 81.1  91.4 - 78.2 (%)   Platelets 189  150 - 400 (K/uL)  GLUCOSE, CAPILLARY     Status: Abnormal   Collection Time   07/29/11  6:40 AM      Component Value Range   Glucose-Capillary 187 (*) 70 - 99 (mg/dL)   Comment 1 Documented in Chart     Comment 2 Notify RN    GLUCOSE, CAPILLARY     Status: Abnormal   Collection Time   07/29/11 11:51 AM      Component Value Range   Glucose-Capillary 206 (*) 70 - 99 (mg/dL)  GLUCOSE, CAPILLARY     Status: Abnormal   Collection Time   07/29/11  4:44 PM      Component Value Range   Glucose-Capillary 146 (*) 70 - 99 (mg/dL)  URINALYSIS, ROUTINE W REFLEX MICROSCOPIC     Status: Abnormal   Collection Time   07/29/11  4:44 PM      Component Value Range   Color, Urine YELLOW  YELLOW    APPearance CLEAR  CLEAR    Specific Gravity, Urine 1.022  1.005 - 1.030    pH 5.5  5.0 - 8.0    Glucose, UA 100 (*) NEGATIVE (mg/dL)   Hgb urine dipstick SMALL (*) NEGATIVE    Bilirubin Urine SMALL (*) NEGATIVE    Ketones, ur 15 (*) NEGATIVE (mg/dL)   Protein, ur 956 (*) NEGATIVE (mg/dL)   Urobilinogen, UA 1.0  0.0 - 1.0 (mg/dL)   Nitrite NEGATIVE  NEGATIVE    Leukocytes, UA NEGATIVE  NEGATIVE   URINE MICROSCOPIC-ADD ON     Status: Abnormal   Collection Time   07/29/11  4:44 PM      Component Value Range   Squamous Epithelial / LPF FEW (*) RARE    WBC, UA 3-6  <3 (WBC/hpf)   RBC / HPF 0-2  <3 (RBC/hpf)   Bacteria, UA FEW (*) RARE    Casts GRANULAR CAST (*) NEGATIVE    Urine-Other MUCOUS PRESENT    VITAMIN B12     Status: Abnormal   Collection Time   07/29/11  4:48 PM      Component Value Range    Vitamin B-12 176 (*) 211 - 911 (pg/mL)  GLUCOSE, CAPILLARY     Status: Abnormal   Collection Time   07/29/11 10:18 PM      Component Value Range   Glucose-Capillary 115 (*) 70 - 99 (mg/dL)   Dg Chest 2 View  16/05/9603  *RADIOLOGY REPORT*  Clinical Data: Cough.  Leukocytosis.  Low grade fever.  CHEST - 2 VIEW 07/29/2011:  Comparison: Two-view chest x-ray 05/24/2011 and 07/09/2005 [facility].  Findings: Markedly suboptimal inspiration.  Stable chronic elevation of the right hemidiaphragm and chronic scar/atelectasis in the right lower lobe.  Lungs otherwise clear.  Cardiac silhouette upper normal in size for technique, unchanged.  Thoracic aorta atherosclerotic, unchanged.  Hilar and mediastinal contours otherwise unremarkable.  Degenerative changes involving the thoracic spine.  Prior right shoulder reconstructive surgery. Degenerative changes involving both shoulders.  IMPRESSION: Suboptimal inspiration.  No acute cardiopulmonary disease.  Stable chronic elevation of the right hemidiaphragm and associated chronic atelectasis/scarring in the right lower lobe.  Original Report Authenticated By: Arnell Sieving, M.D.    Assessment/Plan: Diagnosis: Cervical stenosis and myelopathy status post cervical 3-4 laminectomy with decompression of spinal cord, bilateral foraminotomies  1. Does the need for close, 24 hr/day medical supervision in concert with the patient's rehab needs make it unreasonable for this patient to be served in a less intensive setting? Yes 2. Co-Morbidities requiring supervision/potential complications: Hypertension, CAD, peripheral neuropathy, history of left knee replacement 3. Due to bladder management, bowel management, safety, skin/wound care, disease management, medication administration, pain management and patient education, does the patient require 24 hr/day rehab nursing? Yes 4. Does the patient require coordinated care of a physician, rehab nurse, PT (1-2  hrs/day, 5 days/week) and OT (1-2 hrs/day, 5 days/week) to address physical and functional deficits in the context of the above medical diagnosis(es)? Yes Addressing deficits in the following areas: balance, bathing, bowel/bladder control, dressing, endurance, feeding, grooming, locomotion, psychosocial adjustment, speech, strength, toileting and transferring 5. Can the patient actively participate in an intensive therapy program of at least 3 hrs of therapy per day at least 5 days per week? Yes 6. The potential for patient to make measurable gains while on inpatient rehab is excellent 7. Anticipated functional outcomes upon discharge from inpatients are supervision PT, supervision to minimal assistance OT, Estimated rehab length of stay to reach the above functional goals is: 2 and half weeks 8. Does the patient have adequate social supports to accommodate these discharge functional goals? Yes 9. Anticipated D/C setting: Home 10. Anticipated post D/C treatments: HH therapy 11. Overall Rehab/Functional Prognosis: excellent  RECOMMENDATIONS: This patient's condition is appropriate for continued rehabilitative care in the following setting: CIR Patient has agreed to participate in recommended program. Yes Note that insurance prior authorization may be required for reimbursement for recommended care.  Comment: Patient has substantial upper and lower extremity weakness. He has associated sensory loss as well. Patient and wife are both both motivated for him to improve and he would like to participate in our inpatient rehabilitation program. Rehabilitation case manager will followup   07/30/2011

## 2011-07-30 NOTE — Progress Notes (Signed)
  Echocardiogram 2D Echocardiogram has been performed.  Christopher [name], Real Cons 07/30/2011, 10:22 AM

## 2011-07-30 NOTE — Progress Notes (Signed)
Physical Therapy Treatment Patient Details Name: Christopher [name] MRN: [id number] DOB: August 01, 1941 Today's Date: 07/30/2011  PT Assessment/Plan  PT - Assessment/Plan Comments on Treatment Session: Pt progressing with PT goals today- able to ambulate ~17' with RW.  Pt still c/o of pain in bil knees down to feet, but states swelling seems to be improving slightly.   PT Plan: Discharge plan remains appropriate PT Frequency: Min 5X/week Follow Up Recommendations: Inpatient Rehab Equipment Recommended: Defer to next venue PT Goals  Acute Rehab PT Goals PT Goal: Supine/Side to Sit - Progress: Progressing toward goal PT Goal: Sit to Stand - Progress: Progressing toward goal PT Goal: Stand to Sit - Progress: Progressing toward goal PT Transfer Goal: Bed to Chair/Chair to Bed - Progress: Progressing toward goal PT Goal: Ambulate - Progress: Progressing toward goal  PT Treatment Precautions/Restrictions  Precautions Precautions: Fall Required Braces or Orthoses: No Restrictions Weight Bearing Restrictions: No Mobility (including Balance) Bed Mobility Rolling Right: 4: Min assist;5: Supervision;With rail Rolling Right Details (indicate cue type and reason): Cues for sequencing, technique, rolling to decrease stress place on neck, LUE placement/use.   Rolling Left: 5: Supervision;With rail Rolling Left Details (indicate cue type and reason): cues for sequencing, technique, placement/use of RUE Right Sidelying to Sit: 4: Min assist;With rails;HOB flat Right Sidelying to Sit Details (indicate cue type and reason): (A) to bring shoulders/trunk to upright sitting, use of draw pad to pivot hips to square up on EOB.   Transfers Sit to Stand: 1: +2 Total assist;3: Mod assist;With upper extremity assist;With armrests;From bed;From chair/3-in-1 Sit to Stand Details (indicate cue type and reason): +2 pt=70% with sit>stand from bed & first time to stand from recliner but then progressed to Mod (A) from  recliner with pillows to elevate seat surface.  Cues for initiation, sequencing, hand placement, foot placement.  (A) to achieve standing, anterior translation of trunk over BOS, balance, steady RW.   Stand to Sit: 2: Max assist;To chair/3-in-1;With upper extremity assist;With armrests;To elevated surface Stand to Sit Details: (A) to control descent, locate armrests of recliner, safety; Pillows placed in seat of recliner to provide elevated surface.   Stand Pivot Transfers: 1: +2 Total assist Stand Pivot Transfer Details (indicate cue type and reason): pt=70%; (A) for balance, lateral wt shifting to advance LE's, management of RW.   Ambulation/Gait Ambulation/Gait Assistance: 1: +2 Total assist Ambulation/Gait Assistance Details (indicate cue type and reason): +2 pt = 80%; (A) for balance to prevent LOB backwards, advancement of RW, lateral wt shifting in order to advance R<>L foot; ambulated 5' + 12' with recliner following behind.  Pt c/o of increased bil knee>foot pain & weakness/fatigue.   Ambulation Distance (Feet): 17 Feet (5' + 12' with seated rest break between trials.  ) Assistive device: Rolling walker Gait Pattern: Step-through pattern;Decreased step length - right;Decreased step length - left;Decreased stride length;Decreased weight shift to left;Decreased weight shift to right;Right flexed knee in stance;Left flexed knee in stance (decreased step height bil LE's, ) Stairs: No Wheelchair Mobility Wheelchair Mobility: No    Exercise    End of Session PT - End of Session Equipment Utilized During Treatment: Gait belt Activity Tolerance: Patient limited by fatigue;Patient limited by pain;Other (comment) (weakness per pt) Patient left: in chair;with call bell in reach;with family/visitor present Nurse Communication: Mobility status for transfers General Behavior During Session: [facility] for tasks performed Cognition: [facility] for tasks performed  Lara Mulch 07/30/2011, 11:32 AM   [phone]

## 2011-07-30 NOTE — Progress Notes (Signed)
Subjective: Patient reports less swelling of legs.  Objective: Vital signs in last 24 hours: Temp:  [98 F (36.7 C)-100 F (37.8 C)] 98.2 F (36.8 C) (12/19 1025) Pulse Rate:  [94-120] 94  (12/19 1025) Resp:  [18] 18  (12/19 1025) BP: (88-167)/(63-93) 153/89 mmHg (12/19 1025) SpO2:  [90 %-97 %] 97 % (12/19 1025)  Intake/Output from previous day: 12/18 0701 - 12/19 0700 In: -  Out: 800 [Urine:800] Intake/Output this shift:    Neurologic: Mental status: Alert, oriented, thought content appropriate, alertness: alert Same weakness as preop.wound dry and less swelling. Lab Results:  [facility] 07/30/11 0550 07/29/11 0625  WBC 15.1* 16.4*  HGB 11.8* 12.0*  HCT 34.8* 35.1*  PLT 215 189   BMET  Basename 07/30/11 0550 07/29/11 0625  NA 136 134*  K 3.2* 3.5  CL 93* 93*  CO2 32 24  GLUCOSE 134* 183*  BUN 29* 20  CREATININE 1.23 1.19  CALCIUM 9.0 8.6    Studies/Results: Dg Chest 2 View  07/29/2011  *RADIOLOGY REPORT*  Clinical Data: Cough.  Leukocytosis.  Low grade fever.  CHEST - 2 VIEW 07/29/2011:  Comparison: Two-view chest x-ray 05/24/2011 and 07/09/2005 [facility].  Findings: Markedly suboptimal inspiration.  Stable chronic elevation of the right hemidiaphragm and chronic scar/atelectasis in the right lower lobe.  Lungs otherwise clear.  Cardiac silhouette upper normal in size for technique, unchanged.  Thoracic aorta atherosclerotic, unchanged.  Hilar and mediastinal contours otherwise unremarkable.  Degenerative changes involving the thoracic spine.  Prior right shoulder reconstructive surgery. Degenerative changes involving both shoulders.  IMPRESSION: Suboptimal inspiration.  No acute cardiopulmonary disease.  Stable chronic elevation of the right hemidiaphragm and associated chronic atelectasis/scarring in the right lower lobe.  Original Report Authenticated By: Arnell Sieving, M.D.    Assessment/Plan: Continue overall care as per hospitalist  LOS: 5 days    pt/ot. rehabilitation medicine to evaluate.   Joette Schmoker M 07/30/2011, 11:33 AM

## 2011-07-31 ENCOUNTER — Inpatient Hospital Stay (HOSPITAL_COMMUNITY): Payer: Medicare Other

## 2011-07-31 LAB — BASIC METABOLIC PANEL WITH GFR
BUN: 51 mg/dL — ABNORMAL HIGH (ref 6–23)
BUN: 55 mg/dL — ABNORMAL HIGH (ref 6–23)
CO2: 30 meq/L (ref 19–32)
CO2: 29 meq/L (ref 19–32)
Calcium: 8.9 mg/dL (ref 8.4–10.5)
Calcium: 9.2 mg/dL (ref 8.4–10.5)
Chloride: 93 meq/L — ABNORMAL LOW (ref 96–112)
Chloride: 93 meq/L — ABNORMAL LOW (ref 96–112)
Creatinine, Ser: 1.84 mg/dL — ABNORMAL HIGH (ref 0.50–1.35)
Creatinine, Ser: 2 mg/dL — ABNORMAL HIGH (ref 0.50–1.35)
GFR calc Af Amer: 42 mL/min — ABNORMAL LOW
GFR calc Af Amer: 38 mL/min — ABNORMAL LOW
GFR calc non Af Amer: 36 mL/min — ABNORMAL LOW
GFR calc non Af Amer: 33 mL/min — ABNORMAL LOW
Glucose, Bld: 192 mg/dL — ABNORMAL HIGH (ref 70–99)
Glucose, Bld: 213 mg/dL — ABNORMAL HIGH (ref 70–99)
Potassium: 4.1 meq/L (ref 3.5–5.1)
Potassium: 3.9 meq/L (ref 3.5–5.1)
Sodium: 134 meq/L — ABNORMAL LOW (ref 135–145)
Sodium: 135 meq/L (ref 135–145)

## 2011-07-31 LAB — GLUCOSE, CAPILLARY
Glucose-Capillary: 198 mg/dL — ABNORMAL HIGH (ref 70–99)
Glucose-Capillary: 145 mg/dL — ABNORMAL HIGH (ref 70–99)

## 2011-07-31 MED ORDER — GUAIFENESIN-DM 100-10 MG/5ML PO SYRP
5.0000 mL | ORAL_SOLUTION | ORAL | Status: DC | PRN
  Administered 2011-07-31 – 2011-08-01 (×5): 5 mL via ORAL
  Filled 2011-07-31 (×5): qty 5

## 2011-07-31 MED ORDER — FUROSEMIDE 40 MG PO TABS
40.0000 mg | ORAL_TABLET | Freq: Every day | ORAL | Status: DC
  Administered 2011-07-31: 40 mg via ORAL
  Filled 2011-07-31: qty 1

## 2011-07-31 MED ORDER — TECHNETIUM TO 99M ALBUMIN AGGREGATED
6.0000 | Freq: Once | INTRAVENOUS | Status: AC | PRN
  Administered 2011-07-31: 6 via INTRAVENOUS

## 2011-07-31 MED ORDER — SODIUM CHLORIDE 0.9 % IV SOLN
INTRAVENOUS | Status: DC
  Administered 2011-07-31: 14:00:00 via INTRAVENOUS

## 2011-07-31 MED ORDER — XENON XE 133 GAS
10.0000 | GAS_FOR_INHALATION | Freq: Once | RESPIRATORY_TRACT | Status: AC | PRN
  Administered 2011-07-31: 10 via RESPIRATORY_TRACT

## 2011-07-31 MED ORDER — LEVALBUTEROL HCL 1.25 MG/0.5ML IN NEBU
1.2500 mg | INHALATION_SOLUTION | Freq: Four times a day (QID) | RESPIRATORY_TRACT | Status: DC | PRN
  Filled 2011-07-31: qty 0.5

## 2011-07-31 NOTE — Progress Notes (Signed)
Subjective: Any pain shortness of breath still has swelling in his legs  Objective: Vital signs in last 24 hours: Filed Vitals:   07/30/11 2200 07/31/11 0200 07/31/11 0600 07/31/11 1000  BP: 129/76 96/57 90/66  121/63  Pulse: 116 94 82 87  Temp: 97.8 F (36.6 C) 98.4 F (36.9 C) 98 F (36.7 C) 97.3 F (36.3 C)  TempSrc:    Oral  Resp: 18 18 18 16   Height:      Weight:      SpO2: 94% 96% 91% 95%    Intake/Output Summary (Last 24 hours) at 07/31/11 1224 Last data filed at 07/30/11 2214  Gross per 24 hour  Intake    100 ml  Output    650 ml  Net   -550 ml    Weight change:   General: Alert, awake, oriented x3, in no acute distress. HEENT: No bruits, no goiter. Heart: Regular rate and rhythm, without murmurs, rubs, gallops. Lungs: Clear to auscultation bilaterally. Abdomen: Soft, nontender, nondistended, positive bowel sounds. Extremities: 2+ pitting edema in both lower extremities .positive pedal pulses. Neuro: Grossly intact, nonfocal.    Lab Results: Results for orders placed during the hospital encounter of 07/25/11 (from the past 24 hour(s))  GLUCOSE, CAPILLARY     Status: Abnormal   Collection Time   07/30/11  4:11 PM      Component Value Range   Glucose-Capillary 155 (*) 70 - 99 (mg/dL)  GLUCOSE, CAPILLARY     Status: Normal   Collection Time   07/30/11 10:21 PM      Component Value Range   Glucose-Capillary 85  70 - 99 (mg/dL)  GLUCOSE, CAPILLARY     Status: Abnormal   Collection Time   07/31/11  6:44 AM      Component Value Range   Glucose-Capillary 198 (*) 70 - 99 (mg/dL)  BASIC METABOLIC PANEL     Status: Abnormal   Collection Time   07/31/11  7:15 AM      Component Value Range   Sodium 134 (*) 135 - 145 (mEq/L)   Potassium 4.1  3.5 - 5.1 (mEq/L)   Chloride 93 (*) 96 - 112 (mEq/L)   CO2 30  19 - 32 (mEq/L)   Glucose, Bld 192 (*) 70 - 99 (mg/dL)   BUN 51 (*) 6 - 23 (mg/dL)   Creatinine, Ser 1.61 (*) 0.50 - 1.35 (mg/dL)   Calcium 8.9  8.4 -  10.5 (mg/dL)   GFR calc non Af Amer 36 (*) >90 (mL/min)   GFR calc Af Amer 42 (*) >90 (mL/min)  BASIC METABOLIC PANEL     Status: Abnormal   Collection Time   07/31/11  9:55 AM      Component Value Range   Sodium 135  135 - 145 (mEq/L)   Potassium 3.9  3.5 - 5.1 (mEq/L)   Chloride 93 (*) 96 - 112 (mEq/L)   CO2 29  19 - 32 (mEq/L)   Glucose, Bld 213 (*) 70 - 99 (mg/dL)   BUN 55 (*) 6 - 23 (mg/dL)   Creatinine, Ser 0.96 (*) 0.50 - 1.35 (mg/dL)   Calcium 9.2  8.4 - 04.5 (mg/dL)   GFR calc non Af Amer 33 (*) >90 (mL/min)   GFR calc Af Amer 38 (*) >90 (mL/min)  GLUCOSE, CAPILLARY     Status: Abnormal   Collection Time   07/31/11 12:07 PM      Component Value Range   Glucose-Capillary 174 (*) 70 - 99 (mg/dL)  Comment 1 Notify RN       Micro: No results found for this or any previous visit (from the past 240 hour(s)).  Studies/Results: Dg Chest 2 View  07/29/2011  *RADIOLOGY REPORT*  Clinical Data: Cough.  Leukocytosis.  Low grade fever.  CHEST - 2 VIEW 07/29/2011:  Comparison: Two-view chest x-ray 05/24/2011 and 07/09/2005 [facility].  Findings: Markedly suboptimal inspiration.  Stable chronic elevation of the right hemidiaphragm and chronic scar/atelectasis in the right lower lobe.  Lungs otherwise clear.  Cardiac silhouette upper normal in size for technique, unchanged.  Thoracic aorta atherosclerotic, unchanged.  Hilar and mediastinal contours otherwise unremarkable.  Degenerative changes involving the thoracic spine.  Prior right shoulder reconstructive surgery. Degenerative changes involving both shoulders.  IMPRESSION: Suboptimal inspiration.  No acute cardiopulmonary disease.  Stable chronic elevation of the right hemidiaphragm and associated chronic atelectasis/scarring in the right lower lobe.  Original Report Authenticated By: Arnell Sieving, M.D.    Medications:     . amitriptyline  25 mg Oral QHS  . docusate sodium  100 mg Oral BID  . enoxaparin (LOVENOX)  injection  40 mg Subcutaneous Q24H  . glipiZIDE  10 mg Oral QAC breakfast  . insulin aspart  0-15 Units Subcutaneous TID WC  . insulin aspart  0-5 Units Subcutaneous QHS  . metoprolol succinate  100 mg Oral Daily  . potassium chloride  30 mEq Oral BID  . sodium chloride  3 mL Intravenous Q12H  . DISCONTD: bupivacaine liposome  20 mL Infiltration Once  . DISCONTD: furosemide  20 mg Oral Daily  . DISCONTD: furosemide  40 mg Oral Daily  . DISCONTD: metFORMIN  1,000 mg Oral BID WC  . DISCONTD: quinapril  40 mg Oral Daily    Assessment:  1. bilateral lower extremity edema:  Unclear etiology for now,  - Doppler ultrasound negative for DVT  - He has a history of coronary artery disease, no history of CHF, , 2-D echo pending, living Lasix and ACE inhibitor because of significant increase in creatinine, elevate his legs on the bed. D/c Norvasc. He will likely benefit from ted hose .   2. diabetes mellitus:  Non-insulin-dependent  - continue  glipizide, obtain renal panel marked metformin DC'd because of increasing creatinine 3. Hypertension/CAD:  - Asymptomatic except bilateral edema,increase beta blocker, d/cNorvasc, discontinue ACE inhibitor.  4. 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 Defer management to neurosurgery, Try low dose Elavil for the lower extremity pain and vitamin B 12 levels were low, patient will be started on intramuscular supplementation. #5 acute renal insufficiency patient has an indwelling Foley catheter, will do a renal ultrasound are most likely this is secondary to prerenal etiology. Will start her dose IV fluids    LOS: 6 days   [facility] 07/31/2011, 12:24 PM

## 2011-07-31 NOTE — Progress Notes (Signed)
Verdell Face, [place] [phone] 07/31/2011

## 2011-07-31 NOTE — PMR Pre-admission (Signed)
PMR Admission Coordinator Pre-Admission Assessment  Patient:  Christopher [name] is an 70 y.o., male MRN:  [id number] DOB:  27-Feb-1941 Height:  5\' 10"  (177.8 cm) Weight:  94.1 kg (207 lb 7.3 oz)   Insurance Information: HMO:yes    PPO:     PCP:     IPA:     80/20:     OTHER:medicare replacement hmo pos plan PRIMARY:[employer] Medicare      Policy#:[id number]      Subscriber:pt CM Name:Shanon   Stacy   Phone#:[phone]     Fax#:none/on site Pre-Cert#:[id number]      Employer:retired Benefits:  Phone #:[phone]     Name:12/20 Mark Eff. 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  History of Present Illness: Admitted 12/14 with progressive weakness in both upper extremities. Imaging showed severe stenosis with radiculopathy at the level of cervical 3-4. Underwent bilateral cervical 3-4 laminectomy with decompression of spinal cord, bilateral foraminotomies 12/14.  Hospitalists consulted for BLE edema. BLE dopplers negative. IVF d/c'd, treated with IV lasix and d/c'd norvasc.  Recommend TED hose.  NO PE  By scan on 12/20.  Creat down after IVF on 12/20. Temp 101.5 on 12/20. Chest xray today with no active disease. Cleared to admit today by hospitalists.  Patients Past Medical History:   Past Medical History  Diagnosis Date  . Coronary artery disease   . Arthritis   . oral control   . 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 Hypertension     stress test done    [facility] in [place]  . Myocardial infarction       ,2003,     . Anxiety   . 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   Patients Current Diet: Carb Control  Prior Rehab/Hospitalizations:  Home health only in the past  Medications { Current Medications: Current facility-administered medications:0.9 %  sodium chloride infusion, 250 mL, Intravenous, Continuous, Ernesto M Botero;  0.9 %  sodium chloride infusion, , Intravenous, Continuous, Nayana Abrol, Last Rate: 50 mL/hr at 07/31/11 1336;  acetaminophen (TYLENOL) suppository 650 mg, 650 mg, Rectal, Q4H PRN, [name] Botero;  acetaminophen (TYLENOL) tablet 650 mg, 650 mg, Oral, Q4H PRN, Karn Cassis, 650 mg at 07/31/11 1837 amitriptyline (ELAVIL) tablet 25 mg, 25 mg, Oral, QHS, Ernesto M Botero, 25 mg at 07/31/11 2237;  bisacodyl (DULCOLAX) suppository 10 mg, 10 mg, Rectal, Daily PRN, [name] Botero;  docusate sodium (COLACE) capsule 100 mg, 100 mg, Oral, BID, Ernesto M Botero, 100 mg at 07/31/11 2237;  enoxaparin (LOVENOX) injection 40 mg, 40 mg, Subcutaneous, Q24H, Nayana Abrol, 40 mg at 07/31/11 1228 glipiZIDE (GLUCOTROL) tablet 10 mg, 10 mg, Oral, QAC breakfast, [name] Botero, 10 mg at 08/01/11 0842;  guaiFENesin-dextromethorphan (ROBITUSSIN DM) 100-10 MG/5ML syrup 5 mL,  5 mL, Oral, Q4H PRN, Nayana Abrol, 5 mL at 08/01/11 1610;  HYDROmorphone (DILAUDID) tablet 2 mg, 2 mg, Oral, Q2H PRN, Dorian Heckle, MD, 2 mg at 07/29/11 1412 insulin aspart (novoLOG) injection 0-15 Units, 0-15 Units, Subcutaneous, TID WC, Ripudeep K Rai, MD, 5 Units at 08/01/11 0841;  insulin aspart (novoLOG) injection 0-5 Units, 0-5 Units, Subcutaneous, QHS, Ripudeep K Rai, MD, 3 Units at 07/31/11 2237;  levalbuterol (XOPENEX) nebulizer solution 1.25 mg, 1.25 mg, Nebulization, QID PRN, Nayana Abrol menthol-cetylpyridinium (CEPACOL) lozenge 3 mg, 1 lozenge, Oral, PRN, [name] Botero, 3 mg at 07/26/11 0245;  metoprolol  (TOPROL-XL) 24 hr tablet 100 mg, 100 mg, Oral, Daily, [name] Botero, 100 mg at 07/31/11 1109;  morphine (MSIR) tablet 15 mg, 15 mg, Oral, Q4H PRN, Dorian Heckle, MD, 15 mg at 07/28/11 1217;  morphine 4 MG/ML injection 1-4 mg, 1-4 mg, Intravenous, Q4H PRN, [name] Botero, 2 mg at 07/31/11 1121 ondansetron (ZOFRAN) injection 4 mg, 4 mg, Intravenous, Q4H PRN, [name] Botero;  oxyCODONE-acetaminophen (PERCOCET) 5-325 MG per tablet 1-2 tablet, 1-2 tablet, Oral, Q4H PRN, Karn Cassis, 2 tablet at 08/01/11 9604;  phenol (CHLORASEPTIC) mouth spray 1 spray, 1 spray, Mouth/Throat, PRN, [name] Botero;  potassium chloride (K-DUR,KLOR-CON) CR tablet 30 mEq, 30 mEq, Oral, BID, Mcarthur Rossetti. Angiulli, PA sodium chloride 0.9 % injection 3 mL, 3 mL, Intravenous, Q12H, Karn Cassis, 3 mL at 07/31/11 2211;  sodium chloride 0.9 % injection 3 mL, 3 mL, Intravenous, PRN, Karn Cassis, 3 mL at 07/29/11 2318;  technetium albumin aggregated (MAA) injection solution 6 milli Curie, 6 milli Curie, Intravenous, Once PRN, Medication Radiologist, 6 milli Curie at 07/31/11 1530 xenon xe 133 gas 10 milli Curie, 10 [employer], Armed forces training and education officer, Once PRN, Medication Radiologist, 10 milli Curie at 07/31/11 1520;  zolpidem (AMBIEN) tablet 5 mg, 5 mg, Oral, QHS PRN, Karn Cassis;  DISCONTD: amitriptyline (ELAVIL) tablet 25 mg, 25 mg, Oral, QHS, Mcarthur Rossetti. Angiulli, PA;  DISCONTD: bupivacaine liposome (EXPAREL) 1.3 % injection 266 mg, 20 mL, Infiltration, Once, Ernesto M Botero DISCONTD: enoxaparin (LOVENOX) injection 40 mg, 40 mg, Subcutaneous, Q24H, Mcarthur Rossetti. Angiulli, PA;  DISCONTD: furosemide (LASIX) tablet 20 mg, 20 mg, Oral, Daily, Nayana Abrol, 20 mg at 07/30/11 1538;  DISCONTD: furosemide (LASIX) tablet 40 mg, 40 mg, Oral, Daily, Nayana Abrol, 40 mg at 07/31/11 1109;  DISCONTD: glipiZIDE (GLUCOTROL) tablet 10 mg, 10 mg, Oral, QAC breakfast, Mcarthur Rossetti. Angiulli, PA DISCONTD: insulin aspart (novoLOG) injection 0-15 Units, 0-15  Units, Subcutaneous, TID WC, Mcarthur Rossetti. Angiulli, PA;  DISCONTD: levalbuterol (XOPENEX) nebulizer solution 1.25 mg, 1.25 mg, Nebulization, QID PRN, Mcarthur Rossetti. Angiulli, PA;  DISCONTD: metoprolol (TOPROL-XL) 24 hr tablet 100 mg, 100 mg, Oral, Daily, Mcarthur Rossetti. Angiulli, PA DISCONTD: oxyCODONE-acetaminophen (PERCOCET) 5-325 MG per tablet 1-2 tablet, 1-2 tablet, Oral, Q4H PRN, Mcarthur Rossetti. Angiulli, PA;  DISCONTD: quinapril (ACCUPRIL) tablet 40 mg, 40 mg, Oral, Daily, Karn Cassis, 40 mg at 07/31/11 1110  Precautions/Special Needs:    Additional Precautions/Restrictions: Precautions Precautions: Fall Required Braces or Orthoses: No Restrictions Weight Bearing Restrictions: No  Therapy Assessments Physical Therapy: Home Living Lives With: Spouse Receives Help From: Family Type of Home: House Home Layout: Two level;Able to live on main level with bedroom/bathroom Home Access: Stairs to enter Entrance Stairs-Rails: Right Entrance Stairs-Number of Steps: 1 Bathroom Shower/Tub: Walk-in Contractor: Standard Bathroom Accessibility: Yes How Accessible: Accessible via walker Home Adaptive Equipment: Straight cane   Prior Function  Level of Independence: Requires assistive device for independence;Needs assistance with ADLs;Needs assistance with homemaking;Needs assistance with gait Driving: No Coordination Gross Motor Movements are Fluid and Coordinated: No Fine Motor Movements are Fluid and Coordinated: No Coordination and Movement Description: Decreased fine motor coordination in bil UE Heel Shin Test: Decreased control with overshooting  SLP Recommendations: Equipment Recommended: Defer to next venue    Prior Function: Level of Independence: Requires assistive device for independence;Needs assistance with ADLs;Needs assistance with homemaking;Needs assistance with gait Driving: No  Additional Prior Functional Levels:  Bed Mobility: Mod I with cane PTA  Prior  Activity Level: Limited Community (1-2x/wk): yes  ADLs/Mobility:Current Functional level: ADL Grooming: Simulated;Minimal assistance Grooming Details (indicate cue type and reason): Min assist due to decrease strength/AROM in bil UE Where Assessed - Grooming: Sitting, chair;Supported Upper Body Bathing: Simulated;Minimal assistance Where Assessed - Upper Body Bathing: Sitting, chair;Supported Lower Body Bathing: Simulated;Minimal assistance Where Assessed - Lower Body Bathing: Sit to stand from chair;Supported Location manager Dressing: Simulated;Minimal assistance Where Assessed - Upper Body Dressing: Sitting, chair;Supported Lower Body Dressing: Minimal assistance;Simulated Where Assessed - Lower Body Dressing: Sit to stand from chair;Supported Statistician: Simulated;+2 Total assistance (pt=40%) Statistician Details (indicate cue type and reason): Simulated EOB to chair placed perpendicular to bed. Patient required Max encouragement as well as assist to weight shift left/right to unweight contralateral limb. Also, required assist >50% of transfer to adavance bilateral LEs.  Toilet Transfer Method: Stand pivot Equipment Used: Rolling walker ADL Comments: Pt with decreased I with ADLs and functional transfers due to generalized weakness and pain  Bed Mobility Bed Mobility:  (Pt sitting in Chair before & after session) Rolling Right: 4: Min assist;5: Supervision;With rail Rolling Right Details (indicate cue type and reason): Cues for sequencing, technique, rolling to decrease stress place on neck, LUE placement/use.   Rolling Left: 5: Supervision;With rail Rolling Left Details (indicate cue type and reason): cues for sequencing, technique, placement/use of RUE Right Sidelying to Sit: 4: Min assist;With rails;HOB flat Right Sidelying to Sit Details (indicate cue type and reason): (A) to bring shoulders/trunk to upright sitting, use of draw pad to pivot hips to square up on EOB.     Transfers Transfers: Yes Sit to Stand: 1: +2 Total assist;3: Mod assist;With upper extremity assist;With armrests;From bed;From chair/3-in-1 Sit to Stand Details (indicate cue type and reason): +2 pt=70% with sit>stand from bed & first time to stand from recliner but then progressed to Mod (A) from recliner with pillows to elevate seat surface.  Cues for initiation, sequencing, hand placement, foot placement.  (A) to achieve standing, anterior translation of trunk over BOS, balance, steady RW.   Stand to Sit: 2: Max assist;To chair/3-in-1;With upper extremity assist;With armrests;To elevated surface Stand to Sit Details: (A) to control descent, locate armrests of recliner, safety; Pillows placed in seat of recliner to provide elevated surface.   Stand Pivot Transfers: 1: +2 Total assist Stand Pivot Transfer Details (indicate cue type and reason): pt=70%; (A) for balance, lateral wt shifting to advance LE's, management of RW.   Ambulation/Gait Ambulation/Gait: No Ambulation/Gait Assistance: 1: +2 Total assist Ambulation/Gait Assistance Details (indicate cue type and reason): +2 pt = 80%; (A) for balance to prevent LOB backwards, advancement of RW, lateral wt shifting in order to advance R<>L foot; ambulated 5' + 12' with recliner following behind.  Pt c/o of increased bil knee>foot pain & weakness/fatigue.   Ambulation Distance (Feet): 17 Feet (5' + 12' with seated rest break between trials.  )  Assistive device: Rolling walker Gait Pattern: Step-through pattern;Decreased step length - right;Decreased step length - left;Decreased stride length;Decreased weight shift to left;Decreased weight shift to right;Right flexed knee in stance;Left flexed knee in stance (decreased step height bil LE's, ) Stairs: No Wheelchair Mobility Wheelchair Mobility: No Balance Balance Assessed: Yes Static Standing Balance Static Standing - Balance Support: Bilateral upper extremity supported Static Standing - Level  of Assistance: 1: +2 Total assist Static Standing - Comment/# of Minutes: pt=60%; (A) for balance & safety; Pt stating he can't stand any longer due to pain & increased weakness.  Pt stood at chair with bil UE supported on RW x 45 seconds.    Home Assistive Devices/Equipment:  Home Assistive Devices/Equipment Home Assistive Devices/Equipment: Cane  Discharge Planning:  Living Arrangements: Spouse/significant other;Children;Family members Support Systems: Spouse/significant other;Children;Family members Do you have any problems obtaining your medications?: No Type of Residence: Private residence Home Care Services: No Patient expects to be discharged to::  (home) Expected Discharge Date:  (ELOS 2 weeks) Case Management Consult Needed: Yes (Comment)  Current Functional Levels:  Bladder Continence: foley Bowel Management: continent  Previous Home Environment:  Living Arrangements: Spouse/significant other;Children;Family members Support Systems: Spouse/significant other;Children;Family members Do you have any problems obtaining your medications?: No Type of Residence: Private residence Home Care Services: No Patient expects to be discharged to::  (home) Expected Discharge Date:  (ELOS 2 weeks) Home Environment Number of Levels: two level Previous Home Environment Number of Steps: 1 Previous Home Environment Is Bedroom on Main Floor?: Yes Previous Home Environment Is Bathroom on Main Floor?: Yes  Discharge Living Setting:  Plans for Discharge Living Setting: Patient's home Discharge Living Setting Number of Levels: two level Discharge Living Setting Number of Steps: 2 Discharge Living Setting is Bedroom on Main Floor?: Yes Discharge Living Setting is Bathroom on Main Floor?: Yes  Social/Family/Support Systems:  Patient Roles: Spouse;Parent Contact Information: Ashok Norris Anticipated Caregiver: Okey Regal and son Anticipated Industrial/product designer Information: Okey Regal home: [phone]; cell   [phone] Ability/Limitations of Caregiver: supervision to min assist Caregiver Availability: 24/7 Discharge Plan Discussed with Primary Caregiver: Yes Is Caregiver In Agreement with Plan?: Yes Does Caregiver/Family have Issues with Lodging/Transportation while Pt is in Rehab?: No  Goals/Additional Needs:  Patient/Family Goal for Rehab: supervision P.T., supervision to min assist O.T. Pt/Family Agrees to Admission and willing to participate: Yes Program Orientation Provided & Reviewed with Pt/Caregiver Including Roles  & Responsibilities: Yes  Preadmission Screen Completed By:  Clois Dupes, 08/01/2011 8:56 AM  Patient's condition:  This patient's condition remains as documented in the Consult dated 07/30/2011, in which the Rehabilitation Physician determined and documented that the patient's condition is appropriate for intensive rehabilitative care in an inpatient rehabilitation facility.  Preadmission Screen Competed by:Ottie Glazier, RN, Time/Date,1158 07/31/2011.  Discussed status with Dr. Wynn Banker on 08/01/2011 at 1214 and received telephone approval for admission today.  Admission Coordinator:  Clois Dupes, time 1914 Date 08/01/2011.

## 2011-07-31 NOTE — Progress Notes (Signed)
Noted renal ultrasound pending. I will follow up in the am. Insurance has approved inpt acute rehab when pt medically ready. Pager [phone]

## 2011-07-31 NOTE — Progress Notes (Signed)
PT/OT Cancellation Note  Checked on patient x 3 this am. Patient with MD, phlebotomy, IV team and now RN states patient to go for CT to r/o PE. Will attempt to check on patient again as schedule allows. Thanks!  Glendale Chard    OTR/L Pager: [phone] 07/31/2011

## 2011-07-31 NOTE — Progress Notes (Signed)
Patient ID: Christopher [name], male   DOB: 07-Apr-1941, 70 y.o.   MRN: [id number] Still swelling of both legs. Seen by hospitalist. Decrease of incisional pain. Continue pt.

## 2011-07-31 NOTE — Progress Notes (Signed)
Patient would benefit from an inpatient acute rehab stay prior to discharge home with his wife and family. Patient is in agreement. I will begin pre certification with his [employer]. Noted for CT to r/o PE. I await those results and insurance decision today. We could admit today if insurance approves and patient medically ready. Please call me with any questions. Pager [phone]

## 2011-08-01 ENCOUNTER — Inpatient Hospital Stay (HOSPITAL_COMMUNITY)
Admission: RE | Admit: 2011-08-01 | Discharge: 2011-08-09 | DRG: 945 | Disposition: A | Discharge status: Home / Self Care | Payer: Medicare Other | Source: Ambulatory Visit | Attending: Physical Medicine & Rehabilitation | Admitting: Physical Medicine & Rehabilitation

## 2011-08-01 ENCOUNTER — Inpatient Hospital Stay (HOSPITAL_COMMUNITY): Payer: Medicare Other

## 2011-08-01 DIAGNOSIS — Z794 Long term (current) use of insulin: Secondary | ICD-10-CM

## 2011-08-01 DIAGNOSIS — G8110 Spastic hemiplegia affecting unspecified side: Secondary | ICD-10-CM

## 2011-08-01 DIAGNOSIS — E119 Type 2 diabetes mellitus without complications: Secondary | ICD-10-CM | POA: Diagnosis present

## 2011-08-01 DIAGNOSIS — I252 Old myocardial infarction: Secondary | ICD-10-CM

## 2011-08-01 DIAGNOSIS — N189 Chronic kidney disease, unspecified: Secondary | ICD-10-CM | POA: Diagnosis present

## 2011-08-01 DIAGNOSIS — G8252 Quadriplegia, C1-C4 incomplete: Secondary | ICD-10-CM | POA: Diagnosis present

## 2011-08-01 DIAGNOSIS — I129 Hypertensive chronic kidney disease with stage 1 through stage 4 chronic kidney disease, or unspecified chronic kidney disease: Secondary | ICD-10-CM | POA: Diagnosis present

## 2011-08-01 DIAGNOSIS — G959 Disease of spinal cord, unspecified: Secondary | ICD-10-CM

## 2011-08-01 DIAGNOSIS — Z5189 Encounter for other specified aftercare: Secondary | ICD-10-CM

## 2011-08-01 DIAGNOSIS — I69921 Dysphasia following unspecified cerebrovascular disease: Secondary | ICD-10-CM

## 2011-08-01 DIAGNOSIS — M4712 Other spondylosis with myelopathy, cervical region: Secondary | ICD-10-CM | POA: Diagnosis present

## 2011-08-01 DIAGNOSIS — M4802 Spinal stenosis, cervical region: Secondary | ICD-10-CM | POA: Diagnosis present

## 2011-08-01 DIAGNOSIS — I6330 Cerebral infarction due to thrombosis of unspecified cerebral artery: Secondary | ICD-10-CM

## 2011-08-01 DIAGNOSIS — J209 Acute bronchitis, unspecified: Secondary | ICD-10-CM | POA: Diagnosis present

## 2011-08-01 DIAGNOSIS — R609 Edema, unspecified: Secondary | ICD-10-CM | POA: Diagnosis present

## 2011-08-01 LAB — BASIC METABOLIC PANEL
BUN: 47 mg/dL — ABNORMAL HIGH (ref 6–23)
Calcium: 9.2 mg/dL (ref 8.4–10.5)
GFR calc Af Amer: 65 mL/min — ABNORMAL LOW (ref >90)
GFR calc non Af Amer: 56 mL/min — ABNORMAL LOW (ref >90)
Glucose, Bld: 199 mg/dL — ABNORMAL HIGH (ref 70–99)
Potassium: 4.5 mEq/L (ref 3.5–5.1)
Sodium: 132 mEq/L — ABNORMAL LOW (ref 135–145)

## 2011-08-01 LAB — COMPREHENSIVE METABOLIC PANEL
ALT: 13 U/L (ref 0–53)
AST: 15 U/L (ref 0–37)
Albumin: 2.4 g/dL — ABNORMAL LOW (ref 3.5–5.2)
Alkaline Phosphatase: 93 U/L (ref 39–117)
BUN: 43 mg/dL — ABNORMAL HIGH (ref 6–23)
Chloride: 95 mEq/L — ABNORMAL LOW (ref 96–112)
Potassium: 4.3 mEq/L (ref 3.5–5.1)
Total Bilirubin: 0.3 mg/dL (ref 0.3–1.2)

## 2011-08-01 LAB — GLUCOSE, CAPILLARY
Glucose-Capillary: 213 mg/dL — ABNORMAL HIGH (ref 70–99)
Glucose-Capillary: 236 mg/dL — ABNORMAL HIGH (ref 70–99)
Glucose-Capillary: 272 mg/dL — ABNORMAL HIGH (ref 70–99)

## 2011-08-01 LAB — CBC
HCT: 34.7 % — ABNORMAL LOW (ref 39.0–52.0)
RDW: 13.6 % (ref 11.5–15.5)
WBC: 12.5 10*3/uL — ABNORMAL HIGH (ref 4.0–10.5)

## 2011-08-01 LAB — DIFFERENTIAL
Basophils Relative: 0 % (ref 0–1)
Eosinophils Absolute: 0.2 10*3/uL (ref 0.0–0.7)
Eosinophils Relative: 2 % (ref 0–5)
Monocytes Absolute: 1.5 10*3/uL — ABNORMAL HIGH (ref 0.1–1.0)
Monocytes Relative: 12 % (ref 3–12)
Neutrophils Relative %: 78 % — ABNORMAL HIGH (ref 43–77)

## 2011-08-01 LAB — URINALYSIS, ROUTINE W REFLEX MICROSCOPIC
Bilirubin Urine: NEGATIVE
Hgb urine dipstick: NEGATIVE
Protein, ur: NEGATIVE mg/dL
Urobilinogen, UA: 1 mg/dL (ref 0.0–1.0)

## 2011-08-01 MED ORDER — SENNA 8.6 MG PO TABS
1.0000 | ORAL_TABLET | Freq: Two times a day (BID) | ORAL | Status: DC
  Administered 2011-08-01 – 2011-08-09 (×16): 8.6 mg via ORAL
  Filled 2011-08-01 (×17): qty 1

## 2011-08-01 MED ORDER — ENOXAPARIN SODIUM 40 MG/0.4ML ~~LOC~~ SOLN: 40.0000 mg | SUBCUTANEOUS | Status: DC

## 2011-08-01 MED ORDER — INSULIN ASPART 100 UNIT/ML ~~LOC~~ SOLN: 0.0000 [IU] | Freq: Three times a day (TID) | SUBCUTANEOUS | Status: DC

## 2011-08-01 MED ORDER — LEVALBUTEROL HCL 1.25 MG/0.5ML IN NEBU: 1.2500 mg | INHALATION_SOLUTION | Freq: Four times a day (QID) | RESPIRATORY_TRACT | Status: DC | PRN

## 2011-08-01 MED ORDER — PROMETHAZINE HCL 12.5 MG RE SUPP: 12.5000 mg | Freq: Four times a day (QID) | RECTAL | Status: DC | PRN

## 2011-08-01 MED ORDER — AMITRIPTYLINE HCL 25 MG PO TABS: 25.0000 mg | ORAL_TABLET | Freq: Every day | ORAL | Status: DC

## 2011-08-01 MED ORDER — GUAIFENESIN ER 600 MG PO TB12
600.0000 mg | ORAL_TABLET | Freq: Two times a day (BID) | ORAL | Status: DC
  Administered 2011-08-01: 600 mg via ORAL
  Filled 2011-08-01 (×2): qty 1

## 2011-08-01 MED ORDER — OXYCODONE-ACETAMINOPHEN 5-325 MG PO TABS: 1.0000 | ORAL_TABLET | ORAL | Status: DC | PRN

## 2011-08-01 MED ORDER — ENOXAPARIN SODIUM 40 MG/0.4ML ~~LOC~~ SOLN
40.0000 mg | SUBCUTANEOUS | Status: DC
  Administered 2011-08-02 – 2011-08-08 (×7): 40 mg via SUBCUTANEOUS
  Filled 2011-08-01 (×9): qty 0.4

## 2011-08-01 MED ORDER — PROMETHAZINE HCL 12.5 MG PO TABS: 12.5000 mg | ORAL_TABLET | Freq: Four times a day (QID) | ORAL | Status: DC | PRN

## 2011-08-01 MED ORDER — ACETAMINOPHEN 325 MG PO TABS
325.0000 mg | ORAL_TABLET | ORAL | Status: DC | PRN
  Administered 2011-08-01 – 2011-08-04 (×3): 650 mg via ORAL
  Administered 2011-08-04: 325 mg via ORAL
  Administered 2011-08-05 – 2011-08-08 (×8): 650 mg via ORAL
  Filled 2011-08-01 (×9): qty 2
  Filled 2011-08-01: qty 1
  Filled 2011-08-01: qty 2

## 2011-08-01 MED ORDER — SENNOSIDES-DOCUSATE SODIUM 8.6-50 MG PO TABS: 1.0000 | ORAL_TABLET | Freq: Every evening | ORAL | Status: DC | PRN

## 2011-08-01 MED ORDER — METFORMIN HCL 500 MG PO TABS
500.0000 mg | ORAL_TABLET | Freq: Two times a day (BID) | ORAL | Status: DC
  Administered 2011-08-01 – 2011-08-02 (×3): 500 mg via ORAL
  Filled 2011-08-01 (×6): qty 1

## 2011-08-01 MED ORDER — FUROSEMIDE 20 MG PO TABS
20.0000 mg | ORAL_TABLET | Freq: Every day | ORAL | Status: DC
  Administered 2011-08-02 – 2011-08-04 (×3): 20 mg via ORAL
  Filled 2011-08-01 (×5): qty 1

## 2011-08-01 MED ORDER — AZITHROMYCIN 500 MG PO TABS
500.0000 mg | ORAL_TABLET | Freq: Every day | ORAL | Status: DC
  Administered 2011-08-01: 500 mg via ORAL
  Filled 2011-08-01: qty 1

## 2011-08-01 MED ORDER — GLIPIZIDE 10 MG PO TABS: 10.0000 mg | ORAL_TABLET | Freq: Every day | ORAL | Status: DC

## 2011-08-01 MED ORDER — GUAIFENESIN ER 600 MG PO TB12
600.0000 mg | ORAL_TABLET | Freq: Two times a day (BID) | ORAL | Status: DC
  Administered 2011-08-01 – 2011-08-09 (×16): 600 mg via ORAL
  Filled 2011-08-01 (×18): qty 1

## 2011-08-01 MED ORDER — METHOCARBAMOL 500 MG PO TABS
500.0000 mg | ORAL_TABLET | Freq: Four times a day (QID) | ORAL | Status: DC | PRN
  Administered 2011-08-02 – 2011-08-04 (×3): 500 mg via ORAL
  Filled 2011-08-01 (×3): qty 1

## 2011-08-01 MED ORDER — POTASSIUM CHLORIDE CRYS ER 20 MEQ PO TBCR
30.0000 meq | EXTENDED_RELEASE_TABLET | Freq: Two times a day (BID) | ORAL | Status: DC
  Filled 2011-08-01: qty 1

## 2011-08-01 MED ORDER — AZITHROMYCIN 500 MG PO TABS
500.0000 mg | ORAL_TABLET | Freq: Every day | ORAL | Status: AC
  Administered 2011-08-02 – 2011-08-06 (×5): 500 mg via ORAL
  Filled 2011-08-01 (×5): qty 1

## 2011-08-01 MED ORDER — POTASSIUM CHLORIDE CRYS ER 20 MEQ PO TBCR
20.0000 meq | EXTENDED_RELEASE_TABLET | Freq: Every day | ORAL | Status: DC
  Administered 2011-08-02 – 2011-08-09 (×8): 20 meq via ORAL
  Filled 2011-08-01 (×10): qty 1

## 2011-08-01 MED ORDER — METOPROLOL SUCCINATE ER 100 MG PO TB24: 100.0000 mg | ORAL_TABLET | Freq: Every day | ORAL | Status: DC

## 2011-08-01 MED ORDER — INSULIN ASPART 100 UNIT/ML ~~LOC~~ SOLN
0.0000 [IU] | Freq: Every day | SUBCUTANEOUS | Status: DC
  Administered 2011-08-01 – 2011-08-02 (×2): 3 [IU] via SUBCUTANEOUS
  Administered 2011-08-03: 2 [IU] via SUBCUTANEOUS
  Administered 2011-08-04: 3 [IU] via SUBCUTANEOUS

## 2011-08-01 MED ORDER — FUROSEMIDE 20 MG PO TABS: 20.0000 mg | ORAL_TABLET | Freq: Every day | ORAL | Status: DC

## 2011-08-01 MED ORDER — SORBITOL 70 % SOLN: 30.0000 mL | Freq: Every day | Status: DC | PRN

## 2011-08-01 MED ORDER — PROMETHAZINE HCL 25 MG/ML IJ SOLN: 12.5000 mg | Freq: Four times a day (QID) | INTRAMUSCULAR | Status: DC | PRN

## 2011-08-01 MED ORDER — FUROSEMIDE 20 MG PO TABS
20.0000 mg | ORAL_TABLET | Freq: Every day | ORAL | Status: DC
  Administered 2011-08-01: 20 mg via ORAL
  Filled 2011-08-01: qty 1

## 2011-08-01 MED ORDER — INSULIN ASPART 100 UNIT/ML ~~LOC~~ SOLN
0.0000 [IU] | Freq: Three times a day (TID) | SUBCUTANEOUS | Status: DC
  Administered 2011-08-01: 3 [IU] via SUBCUTANEOUS
  Administered 2011-08-02 (×2): 5 [IU] via SUBCUTANEOUS
  Administered 2011-08-02: 9 [IU] via SUBCUTANEOUS
  Administered 2011-08-03: 3 [IU] via SUBCUTANEOUS
  Administered 2011-08-03: 5 [IU] via SUBCUTANEOUS
  Administered 2011-08-03: 3 [IU] via SUBCUTANEOUS
  Administered 2011-08-04: 2 [IU] via SUBCUTANEOUS
  Administered 2011-08-04: 3 [IU] via SUBCUTANEOUS
  Administered 2011-08-04: 5 [IU] via SUBCUTANEOUS
  Administered 2011-08-05 – 2011-08-07 (×6): 2 [IU] via SUBCUTANEOUS
  Administered 2011-08-07 – 2011-08-08 (×2): 1 [IU] via SUBCUTANEOUS
  Administered 2011-08-08: 2 [IU] via SUBCUTANEOUS

## 2011-08-01 NOTE — Progress Notes (Signed)
I await hospitalists, Dr. Susie Cassette, and surgeon,Dr. Jeral Fruit to give medical clearance to admit patient to inpatient acute rehabilitation today. I have contacted both physicians and await their approval. Pager [phone]

## 2011-08-01 NOTE — Progress Notes (Signed)
PT Note 1300  Attempted to see pt in am but pt was off unit for xray and is to D/C to CIR, No acute PT today.

## 2011-08-01 NOTE — Progress Notes (Signed)
Patient ID: Christopher [name], male   DOB: 14-Nov-1940, 70 y.o.   MRN: [id number] Temperature 101. Wound dry. Minimal incisional pain. Still edema of both lower extremities. Neuro unchanged. Accepted by rehabilitation medicine but we need clearance by hospitalist.

## 2011-08-01 NOTE — Progress Notes (Signed)
Pt alert and oriented transferred via bed to room 4003.  Pt and wife educated about rehab unit and schedule.  Safety plan discussed.  See [facility] for full assessment.  Call bell in reach, will continue to monitor Christopher [name]

## 2011-08-01 NOTE — Progress Notes (Signed)
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 Neck area. Rn informed  Assessment & Plan Clinical Impression: Patient is a 70 y.o. year old male with recent admission to the hospital on HPI: 70 year old right-handed male with history of cervical stenosis and fusion in the past of cervical 4-7. Admitted December 14 with progressive weakness in both upper extremities. X-ray and imaging showed severe stenosis with radiculopathy at the level of cervical 3-4. The area between cervical 4 and cervical 7 looked good from prior surgery.  Underwent bilateral cervical 3-4 laminectomy with decompression of spinal cord, bilateral foraminotomies on December 14 by Dr. Jeral Fruit .  Patient transferred to CIR on 08/01/2011 .  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  Prior to hospitalization, patient could complete BADL with supervision.  Patient will benefit from skilled intervention to decrease level of assist with basic self-care skills and increase independence with basic self-care skills prior to discharge home with care partner.  Anticipate patient will require intermittent supervision and follow up home health.  OT - End of Session Activity Tolerance: Tolerates < 10 min activity, no significant change in vital signs Endurance Deficit: Yes OT Assessment Rehab Potential:  Good Barriers to Discharge: None OT Plan OT Frequency: 1-2 X/day, 60-90 minutes Estimated Length of Stay: 2-3 weeks OT Treatment/Interventions: Balance/vestibular training;Community reintegration;DME/adaptive equipment instruction;Functional mobility training;Pain management;Patient/family education;Self Care/advanced ADL retraining;Therapeutic Activities;Therapeutic Exercise;UE/LE Coordination activities;UE/LE Strength taining/ROM;Wheelchair propulsion/positioning  Precautions/Restrictions  Precautions Precautions: Fall Required Braces or Orthoses: No Restrictions Weight Bearing Restrictions: No General Chart Reviewed: Yes Family/Caregiver Present: Yes Vital Signs   Pain Pain Assessment Pain Score:   6 Pain Type: Acute pain Pain Location: Neck Pain Descriptors: Aching;Burning;Sharp Pain Onset: On-going Patients Stated Pain Goal: 6 Pain Intervention(s): Medication (See eMAR) Multiple Pain Sites: No Home Living/Prior Functioning Home Living Lives With: Spouse Receives Help From: Family Type of Home: House Home Layout: Two level;Able to live on main level with bedroom/bathroom Home Access: Stairs to enter Entrance Stairs-Rails: Right Entrance Stairs-Number of Steps: 1 Bathroom Shower/Tub: Publishing copy: Standard Bathroom Accessibility: Yes How Accessible: Accessible via walker Home Adaptive Equipment: Straight cane IADL History Homemaking Responsibilities: No Current License: Yes Mode of Transportation: Car Occupation: Retired Prior Function Level of Independence: Requires assistive device for independence;Needs assistance with ADLs;Needs assistance with homemaking;Needs assistance with gait Driving: Yes Vocation: Retired ADL   Vision/Perception  Vision - History Baseline Vision: Bifocals Vision - Assessment Eye Alignment: Within Functional Limits Vision Assessment: Vision not tested Perception Perception: Within Functional  Limits Praxis Praxis: Intact  Cognition Overall Cognitive Status: Appears within functional limits for tasks assessed Arousal/Alertness: Awake/alert Orientation Level: Oriented X4 Attention: Selective Selective Attention: Appears intact Memory: Appears intact Awareness: Appears intact Problem Solving: Appears intact Safety/Judgment: Appears intact Sensation Sensation Light Touch: Impaired Detail Light Touch Impaired Details: Impaired RUE;Impaired LUE Stereognosis: Not tested Hot/Cold: Not tested Proprioception: Not tested Coordination Gross Motor Movements are Fluid and Coordinated: No Fine Motor Movements  are Fluid and Coordinated: No Coordination and Movement Description: Decrease BUE shoulder reaching in flrxion/abduction Motor  Motor Motor: Other (comment) (Decreased LE movements; unable to weight bear thru LE) Mobility  Bed Mobility Bed Mobility: Yes Rolling Right: 5: Supervision Rolling Right Details: Verbal cues for technique Right Sidelying to Sit: 4: Min assist Right Sidelying to Sit Details: Verbal cues for technique Right Sidelying to Sit Details (indicate cue type and reason): pt. scooted to edge of bed with minimal assist Transfers Transfers: Yes Sit to Stand Details (indicate cue type and reason): unable  Trunk/Postural Assessment  Cervical Assessment Cervical Assessment: Exceptions to [facility] Cervical AROM Overall Cervical AROM Comments: limited by pain Thoracic Assessment Thoracic Assessment: Exceptions to [facility]  Balance Balance Balance Assessed: Yes Dynamic Sitting Balance Dynamic Sitting - Balance Support: Left upper extremity supported Extremity/Trunk Assessment RUE Assessment RUE Assessment: Exceptions to [facility] RUE AROM (degrees) Right Shoulder Flexion  0-170:  (80 degrees) Right Shoulder ABduction 0-140:  (40 degrees) RUE PROM (degrees) Overall PROM Right Upper Extremity: Due to premorbid status RUE Strength RUE Overall Strength: Deficits;Due to  premorbid status LUE Assessment LUE Assessment: Exceptions to [facility] LUE AROM (degrees) Left Shoulder Flexion  0-170:  (80 degrees) Left Shoulder ABduction 0-40: 40 Degrees LUE PROM (degrees) Overall PROM Left Upper Extremity: Due to premorbid status;Deficits LUE Strength LUE Overall Strength: Due to premorbid status  Recommendations for other services: None  Discharge Criteria: Patient will be discharged from OT if patient refuses treatment 3 consecutive times without medical reason, if treatment goals not met, if there is a change in medical status, if patient makes no progress towards goals or if patient is discharged from hospital.  The above assessment, treatment plan, treatment alternatives and goals were discussed and mutually agreed upon: by family  Humberto Seals 08/01/2011, 5:24 PM

## 2011-08-01 NOTE — H&P (Signed)
Physical Medicine and Rehabilitation Admission H&P  Christopher [name] is an 70 y.o. male.   No chief complaint on file. : HPI: 70 year old right-handed male with history of cervical stenosis and fusion in the past of cervical 4-7. Admitted December 14 with progressive weakness in both upper extremities. X-ray and imaging showed severe stenosis with radiculopathy at the level of cervical 3-4. The area between cervical 4 and cervical 7 looked good from prior surgery.  Underwent bilateral cervical 3-4 laminectomy with decompression of spinal cord, bilateral foraminotomies on December 14 by Dr. Jeral Fruit. No plan for cervical collar. Postoperative pain control with morphine/MSIR as needed. Patient with some increased lower extremity swelling. Echocardiogram with ejection fraction 55% and grade 1 diastolic dysfunction.  Bilateral lower extremity  Doppler study completed on December 17 that were negative for deep vein thrombosis. Ventilation perfusion scan also negative for pulmonary emboli. Placed on subcutaneous Lovenox for deep vein prophylaxis. Mild elevation in creatinine to 2.0 from a baseline of 1.58 with renal ultrasound completed that was negative for hydronephrosis. Low-dose Lasix decreased to 20 mg daily due to renal insufficiency. Developed low-grade fever with chest x-ray negative and urine studies pending. Followup by internal medicine and felt to be acute bronchitis and placed on Zithromax x5 days Placed on gentle intravenous fluids and creatinine improved to 1.28. Latest physical therapy notes on December 18 as patient is total assist of 2 for stand to sit and total assist for stand pivot transfers. Ambulation yet to be tested. Physical medicine and rehabilitation was consulted at the request of physical therapy to consider inpatient rehabilitation services  Review of Systems  Constitutional: Negative for fever and malaise/fatigue.  Eyes: Negative for blurred vision.  Respiratory: Negative for cough  and shortness of breath.  Cardiovascular: Negative for chest pain.  Gastrointestinal: Positive for constipation. Negative for nausea.  Genitourinary: Negative for dysuria.  Musculoskeletal: Positive for back pain.  Skin: Negative.  Neurological: Negative for dizziness and headaches.  Psychiatric/Behavioral: The patient has insomnia   Past Medical History  Diagnosis Date  . Coronary artery disease   . Arthritis   . oral control   . 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 Hypertension     stress test done    [facility] in [place]  . Myocardial infarction     ,2003,     . Anxiety   . 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 Back surgery   . Coronary angioplasty     2003      [facility]  . Joint replacement left total knee 2005    both shoulders  . Posterior cervical fusion/foraminotomy 07/25/2011    Procedure: POSTERIOR CERVICAL FUSION/FORAMINOTOMY LEVEL 1;  Surgeon: Karn Cassis;  Location: [facility];  Service: Neurosurgery;  Laterality: N/A;  Cervical Three-Four Laminectomy, Lateral Mass screws   No family history on file. Social History:  reports that he quit smoking about 26 years ago. He does not have any smokeless tobacco history on file. He reports that he drinks alcohol. He reports that he does not use illicit drugs. Allergies:  Allergies  Allergen Reactions  . Neurontin (Gabapentin) Other (See Comments)    Unknown reaction   Medications Prior to Admission  Medication Dose Route Frequency Provider Last Rate Last Dose  . acetaminophen (TYLENOL) tablet 325-650 mg  325-650 mg Oral Q4H PRN Mcarthur Rossetti. Angiulli,  PA      . azithromycin (ZITHROMAX) tablet 500 mg  500 mg Oral Daily Mcarthur Rossetti. Angiulli, PA      . enoxaparin (LOVENOX) injection 40 mg  40 mg Subcutaneous Q24H Mcarthur Rossetti. Angiulli, PA      . furosemide (LASIX) tablet 20 mg  20 mg Oral Daily Mcarthur Rossetti. Angiulli,  PA      . guaiFENesin (MUCINEX) 12 hr tablet 600 mg  600 mg Oral BID Mcarthur Rossetti. Angiulli, PA      . methocarbamol (ROBAXIN) tablet 500 mg  500 mg Oral Q6H PRN Mcarthur Rossetti. Angiulli, PA      . potassium chloride SA (K-DUR,KLOR-CON) CR tablet 20 mEq  20 mEq Oral Daily Mcarthur Rossetti. Angiulli, PA      . promethazine (PHENERGAN) tablet 12.5 mg  12.5 mg Oral Q6H PRN Mcarthur Rossetti. Angiulli, PA       Or  . promethazine (PHENERGAN) suppository 12.5 mg  12.5 mg Rectal Q6H PRN Mcarthur Rossetti. Angiulli, PA       Or  . promethazine (PHENERGAN) injection 12.5 mg  12.5 mg Intramuscular Q6H PRN Mcarthur Rossetti. Angiulli, PA      . senna (SENOKOT) tablet 8.6 mg  1 tablet Oral BID Mcarthur Rossetti. Angiulli, PA      . senna-docusate (Senokot-S) tablet 1 tablet  1 tablet Oral QHS PRN Mcarthur Rossetti. Angiulli, PA      . sorbitol 70 % solution 30 mL  30 mL Oral Daily PRN Mcarthur Rossetti. Angiulli, PA      . technetium albumin aggregated (MAA) injection solution 6 milli Curie  6 milli Curie Intravenous Once PRN Medication Radiologist   6 milli Curie at 07/31/11 1530  . xenon xe 133 gas 10 milli Curie  10 milli Curie Inhalation Once PRN Medication Radiologist   10 milli Curie at 07/31/11 1520  . DISCONTD: 0.9 %  sodium chloride infusion  250 mL Intravenous Continuous [name] Botero      . DISCONTD: 0.9 %  sodium chloride infusion   Intravenous Continuous Nayana Abrol 50 mL/hr at 07/31/11 1336    . DISCONTD: acetaminophen (TYLENOL) suppository 650 mg  650 mg Rectal Q4H PRN [name] Botero      . DISCONTD: acetaminophen (TYLENOL) tablet 650 mg  650 mg Oral Q4H PRN [name] Botero   650 mg at 07/31/11 1837  . DISCONTD: amitriptyline (ELAVIL) tablet 25 mg  25 mg Oral QHS [name] Botero   25 mg at 07/31/11 2237  . DISCONTD: amitriptyline (ELAVIL) tablet 25 mg  25 mg Oral QHS Mcarthur Rossetti. Angiulli, PA      . DISCONTD: azithromycin (ZITHROMAX) tablet 500 mg  500 mg Oral Daily Nayana Abrol   500 mg at 08/01/11 1436  . DISCONTD: bisacodyl (DULCOLAX) suppository 10 mg   10 mg Rectal Daily PRN Karn Cassis      . DISCONTD: bupivacaine liposome (EXPAREL) 1.3 % injection 266 mg  20 mL Infiltration Once Karn Cassis      . DISCONTD: docusate sodium (COLACE) capsule 100 mg  100 mg Oral BID [name] Botero   100 mg at 08/01/11 1005  . DISCONTD: enoxaparin (LOVENOX) injection 40 mg  40 mg Subcutaneous Q24H Nayana Abrol   40 mg at 07/31/11 1228  . DISCONTD: enoxaparin (LOVENOX) injection 40 mg  40 mg Subcutaneous Q24H Mcarthur Rossetti. Angiulli, PA      . DISCONTD: furosemide (LASIX) tablet 20 mg  20 mg Oral Daily Nayana Abrol  20 mg at 07/30/11 1538  . DISCONTD: furosemide (LASIX) tablet 20 mg  20 mg Oral Daily Nayana Abrol   20 mg at 08/01/11 1435  . DISCONTD: furosemide (LASIX) tablet 20 mg  20 mg Oral Daily Mcarthur Rossetti. Angiulli, PA      . DISCONTD: furosemide (LASIX) tablet 40 mg  40 mg Oral Daily Nayana Abrol   40 mg at 07/31/11 1109  . DISCONTD: glipiZIDE (GLUCOTROL) tablet 10 mg  10 mg Oral QAC breakfast [name] Botero   10 mg at 08/01/11 1478  . DISCONTD: glipiZIDE (GLUCOTROL) tablet 10 mg  10 mg Oral QAC breakfast Mcarthur Rossetti. Angiulli, PA      . DISCONTD: guaiFENesin (MUCINEX) 12 hr tablet 600 mg  600 mg Oral BID Nayana Abrol   600 mg at 08/01/11 1436  . DISCONTD: guaiFENesin-dextromethorphan (ROBITUSSIN DM) 100-10 MG/5ML syrup 5 mL  5 mL Oral Q4H PRN Nayana Abrol   5 mL at 08/01/11 1147  . DISCONTD: HYDROmorphone (DILAUDID) tablet 2 mg  2 mg Oral Q2H PRN Dorian Heckle, MD   2 mg at 07/29/11 1412  . DISCONTD: insulin aspart (novoLOG) injection 0-15 Units  0-15 Units Subcutaneous TID WC Ripudeep Jenna Luo, MD   5 Units at 08/01/11 1150  . DISCONTD: insulin aspart (novoLOG) injection 0-15 Units  0-15 Units Subcutaneous TID WC Mcarthur Rossetti. Angiulli, PA      . DISCONTD: insulin aspart (novoLOG) injection 0-5 Units  0-5 Units Subcutaneous QHS Ripudeep Jenna Luo, MD   3 Units at 07/31/11 2237  . DISCONTD: levalbuterol (XOPENEX) nebulizer solution 1.25 mg  1.25 mg Nebulization QID  PRN Nayana Abrol      . DISCONTD: levalbuterol (XOPENEX) nebulizer solution 1.25 mg  1.25 mg Nebulization QID PRN Mcarthur Rossetti. Angiulli, PA      . DISCONTD: menthol-cetylpyridinium (CEPACOL) lozenge 3 mg  1 lozenge Oral PRN [name] Botero   3 mg at 07/26/11 0245  . DISCONTD: metFORMIN (GLUCOPHAGE) tablet 1,000 mg  1,000 mg Oral BID WC [name] Botero   1,000 mg at 07/30/11 1811  . DISCONTD: metoprolol (TOPROL-XL) 24 hr tablet 100 mg  100 mg Oral Daily [name] Botero   100 mg at 08/01/11 1006  . DISCONTD: metoprolol (TOPROL-XL) 24 hr tablet 100 mg  100 mg Oral Daily Mcarthur Rossetti. Angiulli, PA      . DISCONTD: morphine (MSIR) tablet 15 mg  15 mg Oral Q4H PRN Dorian Heckle, MD   15 mg at 07/28/11 1217  . DISCONTD: morphine 4 MG/ML injection 1-4 mg  1-4 mg Intravenous Q4H PRN [name] Botero   4 mg at 08/01/11 1456  . DISCONTD: ondansetron (ZOFRAN) injection 4 mg  4 mg Intravenous Q4H PRN [name] Botero      . DISCONTD: oxyCODONE-acetaminophen (PERCOCET) 5-325 MG per tablet 1-2 tablet  1-2 tablet Oral Q4H PRN [name] Botero   2 tablet at 08/01/11 1435  . DISCONTD: oxyCODONE-acetaminophen (PERCOCET) 5-325 MG per tablet 1-2 tablet  1-2 tablet Oral Q4H PRN Mcarthur Rossetti. Angiulli, PA      . DISCONTD: phenol (CHLORASEPTIC) mouth spray 1 spray  1 spray Mouth/Throat PRN Karn Cassis      . DISCONTD: potassium chloride (K-DUR,KLOR-CON) CR tablet 30 mEq  30 mEq Oral BID Mcarthur Rossetti. Angiulli, PA      . DISCONTD: quinapril (ACCUPRIL) tablet 40 mg  40 mg Oral Daily [name] Botero   40 mg at 07/31/11 1110  . DISCONTD: sodium chloride  0.9 % injection 3 mL  3 mL Intravenous Q12H [name] Botero   3 mL at 07/31/11 2211  . DISCONTD: sodium chloride 0.9 % injection 3 mL  3 mL Intravenous PRN [name] Botero   3 mL at 07/29/11 2318  . DISCONTD: zolpidem (AMBIEN) tablet 5 mg  5 mg Oral QHS PRN [name] Botero       Medications Prior to Admission  Medication Sig Dispense Refill  . amLODipine (NORVASC) 10 MG tablet  Take 10 mg by mouth daily.        Marland Kitchen aspirin EC 81 MG tablet Take 81 mg by mouth daily.        Marland Kitchen glipiZIDE (GLUCOTROL) 10 MG tablet Take 10 mg by mouth daily.        . metFORMIN (GLUCOPHAGE) 1000 MG tablet Take 1,000 mg by mouth 2 (two) times daily with a meal.        . metoprolol succinate (TOPROL-XL) 25 MG 24 hr tablet Take 25 mg by mouth daily.        . Multiple Vitamin (MULTIVITAMIN) tablet Take 1 tablet by mouth daily.        . Omega-3 Fatty Acids (FISH OIL) 1200 MG CAPS Take 1 capsule by mouth daily.        . quinapril (ACCUPRIL) 40 MG tablet Take 40 mg by mouth daily.          Home:     Functional History:    Functional Status:  Mobility:          ADL:    Cognition:       There were no vitals taken for this visit.  Physical Exam  Constitutional: He is oriented to person, place, and time. He appears well-developed.  HENT:  Head: Normocephalic.  Neck: No thyromegaly present.  Cardiovascular: Normal rate and regular rhythm.  Pulmonary/Chest: Breath sounds normal. He has no wheezes.  Abdominal: He exhibits no distension. There is no tenderness.  Musculoskeletal: He exhibits no edema.  Neurological: He is alert and oriented to person, place, and time.  Deltoid strength 2/5 left 3/5 right. Biceps 3-3+ triceps 3-3+ wrist extensors 3+ hand intrinsics 3. Hip flexors 2+ knee extensors 3+ to 4 ankle dorsiflexors plantar flexors 4/5. Sensation diminished in both hands as well as throughout the distal aspects of the legs in a stocking glove distribution from the knees downward. Reflexes are grossly 1+ throughout. Cognitively patient is generally alert and appropriate. Cranial nerve exam is intact.  Skin:  Surgical site clean and dry  Psychiatric: His behavior is normal Extremities 2+ edema in the pre-tibial area, no calf tenderness to palpation. There is reduced ankle range of motion bilaterally Results for orders placed during the hospital encounter of 07/25/11 (from the  past 48 hour(s))  GLUCOSE, CAPILLARY     Status: Normal   Collection Time   07/30/11 10:21 PM      Component Value Range Comment   Glucose-Capillary 85  70 - 99 (mg/dL)   GLUCOSE, CAPILLARY     Status: Abnormal   Collection Time   07/31/11  6:44 AM      Component Value Range Comment   Glucose-Capillary 198 (*) 70 - 99 (mg/dL)   BASIC METABOLIC PANEL     Status: Abnormal   Collection Time   07/31/11  7:15 AM      Component Value Range Comment   Sodium 134 (*) 135 - 145 (mEq/L)    Potassium 4.1  3.5 - 5.1 (mEq/L)  DELTA CHECK NOTED   Chloride 93 (*) 96 - 112 (mEq/L)    CO2 30  19 - 32 (mEq/L)    Glucose, Bld 192 (*) 70 - 99 (mg/dL)    BUN 51 (*) 6 - 23 (mg/dL) DELTA CHECK NOTED   Creatinine, Ser 1.84 (*) 0.50 - 1.35 (mg/dL)    Calcium 8.9  8.4 - 10.5 (mg/dL)    GFR calc non Af Amer 36 (*) >90 (mL/min)    GFR calc Af Amer 42 (*) >90 (mL/min)   BASIC METABOLIC PANEL     Status: Abnormal   Collection Time   07/31/11  9:55 AM      Component Value Range Comment   Sodium 135  135 - 145 (mEq/L)    Potassium 3.9  3.5 - 5.1 (mEq/L)    Chloride 93 (*) 96 - 112 (mEq/L)    CO2 29  19 - 32 (mEq/L)    Glucose, Bld 213 (*) 70 - 99 (mg/dL)    BUN 55 (*) 6 - 23 (mg/dL)    Creatinine, Ser 8.11 (*) 0.50 - 1.35 (mg/dL)    Calcium 9.2  8.4 - 10.5 (mg/dL)    GFR calc non Af Amer 33 (*) >90 (mL/min)    GFR calc Af Amer 38 (*) >90 (mL/min)   GLUCOSE, CAPILLARY     Status: Abnormal   Collection Time   07/31/11 12:07 PM      Component Value Range Comment   Glucose-Capillary 174 (*) 70 - 99 (mg/dL)    Comment 1 Notify RN     GLUCOSE, CAPILLARY     Status: Abnormal   Collection Time   07/31/11  4:51 PM      Component Value Range Comment   Glucose-Capillary 145 (*) 70 - 99 (mg/dL)    Comment 1 Documented in Chart      Comment 2 Notify RN     GLUCOSE, CAPILLARY     Status: Abnormal   Collection Time   07/31/11 10:19 PM      Component Value Range Comment   Glucose-Capillary 269 (*) 70 - 99  (mg/dL)   GLUCOSE, CAPILLARY     Status: Abnormal   Collection Time   08/01/11  6:34 AM      Component Value Range Comment   Glucose-Capillary 213 (*) 70 - 99 (mg/dL)   BASIC METABOLIC PANEL     Status: Abnormal   Collection Time   08/01/11  7:00 AM      Component Value Range Comment   Sodium 132 (*) 135 - 145 (mEq/L)    Potassium 4.5  3.5 - 5.1 (mEq/L) HEMOLYSIS AT THIS LEVEL MAY AFFECT RESULT   Chloride 94 (*) 96 - 112 (mEq/L)    CO2 25  19 - 32 (mEq/L)    Glucose, Bld 199 (*) 70 - 99 (mg/dL)    BUN 47 (*) 6 - 23 (mg/dL)    Creatinine, Ser 9.14  0.50 - 1.35 (mg/dL) DELTA CHECK NOTED   Calcium 9.2  8.4 - 10.5 (mg/dL)    GFR calc non Af Amer 56 (*) >90 (mL/min)    GFR calc Af Amer 65 (*) >90 (mL/min)   GLUCOSE, CAPILLARY     Status: Abnormal   Collection Time   08/01/11 11:49 AM      Component Value Range Comment   Glucose-Capillary 236 (*) 70 - 99 (mg/dL)    Comment 1 Notify RN      Dg Chest 2 View  08/01/2011  *  RADIOLOGY REPORT*  Clinical Data: Low grade fever, cough, postop thoracic surgery  CHEST - 2 VIEW  Comparison: Chest x-ray of 07/29/2011  Findings: There is slight elevation of the right hemidiaphragm with mild right basilar linear atelectasis or scarring.  No definite active infiltrate or effusion is seen.  The heart is within upper limits normal.  There are mild degenerative changes in the thoracic spine.  A lower anterior cervical spine fusion plate is present.  IMPRESSION: No active lung disease.  Persistent slight elevation of the right hemidiaphragm.  Original Report Authenticated By: Juline Patch, M.D.   US Renal  07/31/2011  *RADIOLOGY REPORT*  Clinical Data: Elevated BUN and creatinine.  RENAL / URINARY TRACT ULTRASOUND  Technique:  Complete ultrasound exam of the kidneys and urinary bladder was performed.  Comparison: 03/16/2006  Findings:  The right kidney measures 13.8 cm in long axis.  The left kidney measures 12.9 cm.  Multiple hypoechoic lesions are  identified in both kidneys measuring up to 2.5 cm on the right and 1.7 cm on the left.  These cannot be fully characterized on today's ultrasound given their small size in location, but are probably cysts.  No evidence for hydronephrosis.  Renal cortical echogenicity is within normal limits.  Bladder is normal in appearance.  Impression:  No evidence for hydronephrosis.  Multiple bilateral small hypoechoic lesions probably representing renal cysts.  Renal MRI could be used for more definitive evaluation as warranted.  Original Report Authenticated By: ERIC A. MANSELL, M.D.   Nm Pulmonary Per & Vent  07/31/2011  *RADIOLOGY REPORT*  Clinical Data: Cough after surgery.  NM PULMONARY VENTILATION AND PERFUSION SCAN  Radiopharmaceutical: 10 mCi xenon-133 gas for ventilation study.  6 mCi technetium 68m MAA for perfusion imaging.  Comparison: The chest x-ray from 07/29/2011  Findings: The lung volumes are low with asymmetric elevation of the right hemidiaphragm.  There is some attenuation visible secondary to imaging being acquired with the patient's arms at his side.  No moderate or large peripheral perfusion defect is identified in either lung.  Ventilation imaging shows normal wash and bilaterally.  There is some subtle air trapping in the apices bilaterally.  IMPRESSION: Normal bilateral lung perfusion given the low volumes.  Subtle air trapping in the upper lobes suggests emphysema.  Original Report Authenticated By: ERIC A. MANSELL, M.D.    Post Admission Physician Evaluation: 1. Functional deficits secondary  to cervical myelopathy status post C3-C4 decompression and fusion. Patient has a spastic incomplete quadriparesis. 2. Patient is admitted to receive collaborative, interdisciplinary care between the physiatrist, rehab nursing staff, and therapy team. 3. Patient's level of medical complexity and substantial therapy needs in context of that medical necessity cannot be provided at a lesser intensity of  care such as a SNF. 4. Patient has experienced substantial functional loss from his/her baseline which was documented above under the "Functional History" and "Functional Status" headings.  Judging by the patient's diagnosis, physical exam, and functional history, the patient has potential for functional progress which will result in measurable gains while on inpatient rehab.  These gains will be of substantial and practical use upon discharge  in facilitating mobility and self-care at the household level. 5. Physiatrist will provide 24 hour management of medical needs as well as oversight of the therapy plan/treatment and provide guidance as appropriate regarding the interaction of the two. 6. 24 hour rehab nursing will assist with bladder management, bowel management, safety, skin/wound care, disease management, medication administration, pain management and  patient education  and help integrate therapy concepts, techniques,education, etc. 7. PT will assess and treat for:  Pre-gait training, gait training, endurance, safety, equipment.  Goals are: Modified independent with transfers revision to min assist with ambulation. 8. OT will assess and treat for: ADLs, safety, endurance, equipment.   Goals are: Modified independent upper body dressing and min assist lower body dressing and bathing. 9. SLP will assess and treat for: Not applicable.  Goals are: Not applicable. 10. Case Management and Social Worker will assess and treat for psychological issues and discharge planning. 11. Team conference will be held weekly to assess progress toward goals and to determine barriers to discharge. 12.  Patient will receive at least 3 hours of therapy per day at least 5 days per week. 13. ELOS and Prognosis: 2-3 weeks good   Medical Problem List and Plan: 1. Cervical stenosis with myelopathy. Status post cervical 3-4 laminectomy with decompression of spinal and bilateral foraminotomies December 14. No plan for  cervical collar 2.. DVT Prophylaxis/Anticoagulation: Dopplers lower extremity negative. Continuous Lovenox as directed. Monitor platelet counts any signs of bleeding. 3.. Pain Management: Oxycodone as needed as well as scheduled Elavil at bedtime. Monitor with increased activity 4. Diabetes mellitus. Hemoglobin A1c of 7.4. Continue Glucotrol 10 mg daily . Glucophage had been discontinued due to elevated creatinine. Check blood sugars a.c. and at bedtime. Provide full diabetic teaching. Resume Glucophage in 1 week if creatinine remains stable. 5 Chronic renal insufficiency with creatinine 1.5. Monitor hydration and followup labs. Low-dose Lasix resume the 20 mg daily. 6. Hypertension.  Toprol 100 mg daily. Monitor with increased activity. Quinapril recently discontinued due to elevated creatinine. 7. Acute bronchitis. Zithromax x5 days. Continue Mucinex and incentive spirometry. 8. Lower extremity edema he has been off his usual Lasix. We will need to also put him on compression stockings KIRSTEINS,ANDREW E 08/01/2011, 4:32 PM

## 2011-08-01 NOTE — Progress Notes (Signed)
Subjective: Febrile yesterday, coughing but it is nonproductive ,   Objective: Vital signs in last 24 hours: Filed Vitals:   07/31/11 2200 08/01/11 0200 08/01/11 0600 08/01/11 1000  BP: 127/65 150/82 114/65 137/76  Pulse: 93 90 100 85  Temp: 98.2 F (36.8 C) 98 F (36.7 C) 98.9 F (37.2 C) 97.7 F (36.5 C)  TempSrc:    Oral  Resp: 18 18 18 18   Height:      Weight:      SpO2: 94% 98% 99% 90%    Intake/Output Summary (Last 24 hours) at 08/01/11 1152 Last data filed at 08/01/11 0630  Gross per 24 hour  Intake    845 ml  Output   2050 ml  Net  -1205 ml    Weight change:   General: Alert, awake, oriented x3, in no acute distress. HEENT: No bruits, no goiter. Heart: Regular rate and rhythm, without murmurs, rubs, gallops. Lungs: Clear to auscultation bilaterally. Abdomen: Soft, nontender, nondistended, positive bowel sounds. Extremities: No clubbing cyanosis or edema with positive pedal pulses. Neuro: Grossly intact, nonfocal.    Lab Results: Results for orders placed during the hospital encounter of 07/25/11 (from the past 24 hour(s))  GLUCOSE, CAPILLARY     Status: Abnormal   Collection Time   07/31/11 12:07 PM      Component Value Range   Glucose-Capillary 174 (*) 70 - 99 (mg/dL)   Comment 1 Notify RN    GLUCOSE, CAPILLARY     Status: Abnormal   Collection Time   07/31/11  4:51 PM      Component Value Range   Glucose-Capillary 145 (*) 70 - 99 (mg/dL)   Comment 1 Documented in Chart     Comment 2 Notify RN    GLUCOSE, CAPILLARY     Status: Abnormal   Collection Time   07/31/11 10:19 PM      Component Value Range   Glucose-Capillary 269 (*) 70 - 99 (mg/dL)  GLUCOSE, CAPILLARY     Status: Abnormal   Collection Time   08/01/11  6:34 AM      Component Value Range   Glucose-Capillary 213 (*) 70 - 99 (mg/dL)  BASIC METABOLIC PANEL     Status: Abnormal   Collection Time   08/01/11  7:00 AM      Component Value Range   Sodium 132 (*) 135 - 145 (mEq/L)   Potassium 4.5  3.5 - 5.1 (mEq/L)   Chloride 94 (*) 96 - 112 (mEq/L)   CO2 25  19 - 32 (mEq/L)   Glucose, Bld 199 (*) 70 - 99 (mg/dL)   BUN 47 (*) 6 - 23 (mg/dL)   Creatinine, Ser 2.95  0.50 - 1.35 (mg/dL)   Calcium 9.2  8.4 - 28.4 (mg/dL)   GFR calc non Af Amer 56 (*) >90 (mL/min)   GFR calc Af Amer 65 (*) >90 (mL/min)     Micro: No results found for this or any previous visit (from the past 240 hour(s)).  Studies/Results: Dg Chest 2 View  08/01/2011  *RADIOLOGY REPORT*  Clinical Data: Low grade fever, cough, postop thoracic surgery  CHEST - 2 VIEW  Comparison: Chest x-ray of 07/29/2011  Findings: There is slight elevation of the right hemidiaphragm with mild right basilar linear atelectasis or scarring.  No definite active infiltrate or effusion is seen.  The heart is within upper limits normal.  There are mild degenerative changes in the thoracic spine.  A lower anterior cervical spine fusion plate  is present.  IMPRESSION: No active lung disease.  Persistent slight elevation of the right hemidiaphragm.  Original Report Authenticated By: Juline Patch, M.D.   US Renal  07/31/2011  *RADIOLOGY REPORT*  Clinical Data: Elevated BUN and creatinine.  RENAL / URINARY TRACT ULTRASOUND  Technique:  Complete ultrasound exam of the kidneys and urinary bladder was performed.  Comparison: 03/16/2006  Findings:  The right kidney measures 13.8 cm in long axis.  The left kidney measures 12.9 cm.  Multiple hypoechoic lesions are identified in both kidneys measuring up to 2.5 cm on the right and 1.7 cm on the left.  These cannot be fully characterized on today's ultrasound given their small size in location, but are probably cysts.  No evidence for hydronephrosis.  Renal cortical echogenicity is within normal limits.  Bladder is normal in appearance.  Impression:  No evidence for hydronephrosis.  Multiple bilateral small hypoechoic lesions probably representing renal cysts.  Renal MRI could be used for more  definitive evaluation as warranted.  Original Report Authenticated By: ERIC A. MANSELL, M.D.   Nm Pulmonary Per & Vent  07/31/2011  *RADIOLOGY REPORT*  Clinical Data: Cough after surgery.  NM PULMONARY VENTILATION AND PERFUSION SCAN  Radiopharmaceutical: 10 mCi xenon-133 gas for ventilation study.  6 mCi technetium 73m MAA for perfusion imaging.  Comparison: The chest x-ray from 07/29/2011  Findings: The lung volumes are low with asymmetric elevation of the right hemidiaphragm.  There is some attenuation visible secondary to imaging being acquired with the patient's arms at his side.  No moderate or large peripheral perfusion defect is identified in either lung.  Ventilation imaging shows normal wash and bilaterally.  There is some subtle air trapping in the apices bilaterally.  IMPRESSION: Normal bilateral lung perfusion given the low volumes.  Subtle air trapping in the upper lobes suggests emphysema.  Original Report Authenticated By: ERIC A. MANSELL, M.D.    Medications:     . amitriptyline  25 mg Oral QHS  . azithromycin  500 mg Oral Daily  . docusate sodium  100 mg Oral BID  . enoxaparin (LOVENOX) injection  40 mg Subcutaneous Q24H  . glipiZIDE  10 mg Oral QAC breakfast  . guaiFENesin  600 mg Oral BID  . insulin aspart  0-15 Units Subcutaneous TID WC  . insulin aspart  0-5 Units Subcutaneous QHS  . metoprolol succinate  100 mg Oral Daily  . potassium chloride  30 mEq Oral BID  . sodium chloride  3 mL Intravenous Q12H  . DISCONTD: amitriptyline  25 mg Oral QHS  . DISCONTD: enoxaparin (LOVENOX) injection  40 mg Subcutaneous Q24H  . DISCONTD: furosemide  40 mg Oral Daily  . DISCONTD: glipiZIDE  10 mg Oral QAC breakfast  . DISCONTD: insulin aspart  0-15 Units Subcutaneous TID WC  . DISCONTD: metoprolol succinate  100 mg Oral Daily  . DISCONTD: quinapril  40 mg Oral Daily     Assessment: 1. bilateral lower extremity edema:  Unclear etiology for now,  - Doppler ultrasound negative  for DVT  - He has a history of coronary artery disease, no history of CHF, , resume Lasix at a low dose of 20 mg per day. He will likely benefit from ted hose . Leg elevation when in bed. Bmet in 1 week. 2. diabetes mellitus:  Non-insulin-dependent  - continue glipizide, obtain renal panel , metformin DC'd because of increasing creatinine . May resume metformin in one week, if creatinine is stable  3. Hypertension/CAD:  -  Asymptomatic except bilateral edema,increase beta blocker, d/cNorvasc, discontinue ACE inhibitor.   4. 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 Defer management to neurosurgery, Try low dose Elavil for the lower extremity pain and vitamin B 12 levels were low, patient will be started on intramuscular supplementation.  #5 acute renal insufficiency patient , Foley catheter was removed, renal ultrasound does not show any hydronephrosis. Creatinine improved. Resume low-dose Lasix. #6 fever patient to have a negative chest x-ray, symptoms consistent with acute bronchitis. Will start azithromycin and Mucinex for 5 days.    Disposition okay to discharge to CIR today, Monitor BMP, repeat in one week Continue to hold metformin and resume in 5-7 days       LOS: 7 days   [facility] 08/01/2011, 11:52 AM

## 2011-08-01 NOTE — Progress Notes (Signed)
Overall Plan of Care [facility]) Patient Details Name: Christopher [name] MRN: [id number] DOB: [birth year]  Diagnosis:  Cervical myelopathy and radiculopathy.    Primary Diagnosis:    Myelopathy Co-morbidities: CAD, acute on chronic kidney disease, DM  Functional Problem List  Patient demonstrates impairments in the following areas: Bladder, Bowel, Pain and Safety, transfers, balance, AROM BUE.  Basic ADL's: grooming, bathing, dressing, transfers Advanced ADL's: none  Transfers:  toilet and tub/shower Locomotion:  wheelchair mobility  Additional Impairments:  Functional use of upper extremity  Anticipated Outcomes Item Anticipated Outcome  Eating/Swallowing    Basic self-care  Min assist  Tolieting  Minimal assist  Bowel/Bladder  Pt will remain continent of bowel and bladder  Transfers  Supervised assist  Locomotion  Supervised with A.D. X 30'  Communication    Cognition    Pain  Pt' pain goal of 3 or less  Safety/Judgment  Pt will remain free from injury during stay  Other  Skin will remain intact with healing of cervical incision   Therapy Plan:         Team Interventions: Item RN PT OT SLP SW TR Other  Self Care/Advanced ADL Retraining   x      Neuromuscular Re-Education  x x      Therapeutic Activities  x x      UE/LE Strength Training/ROM  x x      UE/LE Coordination Activities  x x      Visual/Perceptual Remediation/Compensation         DME/Adaptive Equipment Instruction  x x      Therapeutic Exercise  x x      Balance/Vestibular Training  x x      Patient/Family Education  x x      Cognitive Remediation/Compensation         Functional Mobility Training  x x      Ambulation/Gait Training  x       Museum/gallery curator  x       Wheelchair Propulsion/Positioning  x x      Functional Pension scheme manager         Bladder Management x          Bowel Management x        Disease Management/Prevention         Pain Management x x x      Medication Management x        Skin Care/Wound Management x        Splinting/Orthotics         Discharge Planning         Psychosocial Support                            Team Discharge Planning: Destination:  Home Projected Follow-up:  OT Projected Equipment Needs:  Tub Bench, ? Dropped arm 3n1 Patient/family involved in discharge planning:  Yes  MD ELOS: 2-3 weeks  Medical Rehab Prognosis:  Excellent Assessment: patient  is admitted for CIR therapies.  PT, OT will be working at least 3 hours a day at least 5 days per week to address upper and lower extremity strength, NMR, adaptive equipment, gait and functional mobility, pain mgt, ADL's with goals at a supervision to min assist level.

## 2011-08-01 NOTE — Progress Notes (Signed)
Report called to [place], RN in 4000.  Pt belongings bagged and pt transferred to room 4003 via recliner.  All personal belongings sent with pt and wife at side.

## 2011-08-02 DIAGNOSIS — G8110 Spastic hemiplegia affecting unspecified side: Secondary | ICD-10-CM

## 2011-08-02 DIAGNOSIS — Z5189 Encounter for other specified aftercare: Secondary | ICD-10-CM

## 2011-08-02 DIAGNOSIS — I69921 Dysphasia following unspecified cerebrovascular disease: Secondary | ICD-10-CM

## 2011-08-02 DIAGNOSIS — I6330 Cerebral infarction due to thrombosis of unspecified cerebral artery: Secondary | ICD-10-CM

## 2011-08-02 LAB — BASIC METABOLIC PANEL
CO2: 29 mEq/L (ref 19–32)
Chloride: 95 mEq/L — ABNORMAL LOW (ref 96–112)
Glucose, Bld: 287 mg/dL — ABNORMAL HIGH (ref 70–99)
Potassium: 4.1 mEq/L (ref 3.5–5.1)
Sodium: 134 mEq/L — ABNORMAL LOW (ref 135–145)

## 2011-08-02 LAB — GLUCOSE, CAPILLARY
Glucose-Capillary: 254 mg/dL — ABNORMAL HIGH (ref 70–99)
Glucose-Capillary: 367 mg/dL — ABNORMAL HIGH (ref 70–99)

## 2011-08-02 MED ORDER — AMITRIPTYLINE HCL 10 MG PO TABS
10.0000 mg | ORAL_TABLET | Freq: Every day | ORAL | Status: DC
  Administered 2011-08-02 – 2011-08-08 (×7): 10 mg via ORAL
  Filled 2011-08-02 (×8): qty 1

## 2011-08-02 MED ORDER — OXYCODONE-ACETAMINOPHEN 5-325 MG PO TABS
1.0000 | ORAL_TABLET | Freq: Four times a day (QID) | ORAL | Status: DC | PRN
  Administered 2011-08-02: 2 via ORAL
  Administered 2011-08-02: 1 via ORAL
  Administered 2011-08-02 – 2011-08-06 (×7): 2 via ORAL
  Filled 2011-08-02 (×5): qty 2
  Filled 2011-08-02: qty 1
  Filled 2011-08-02 (×4): qty 2

## 2011-08-02 NOTE — Progress Notes (Signed)
Patient ID: Christopher [name], male   DOB: [birth year], 70 y.o.   MRN: [id number]    Patient Information       Patient Name Sex DOB SSN    Christopher [name], Christopher [name] Male 27-Jul-1941 [id number]          Christopher [name] is an 70 y.o. male.    No chief complaint on file. : HPI: 70 year old right-handed male with history of cervical stenosis and fusion in the past of cervical 4-7. Admitted December 14 with progressive weakness in both upper extremities. X-ray and imaging showed severe stenosis with radiculopathy at the level of cervical 3-4. The area between cervical 4 and cervical 7 looked good from prior surgery.   Underwent bilateral cervical 3-4 laminectomy with decompression of spinal cord, bilateral foraminotomies on December 14 by Dr. Jeral Fruit. No plan for cervical collar. Postoperative pain control with morphine/MSIR as needed. Patient with some increased lower extremity swelling. Echocardiogram with ejection fraction 55% and grade 1 diastolic dysfunction.  Bilateral lower extremity  Doppler study completed on December 17 that were negative for deep vein thrombosis. Ventilation perfusion scan also negative for pulmonary emboli. Placed on subcutaneous Lovenox for deep vein prophylaxis. Mild elevation in creatinine to 2.0 from a baseline of 1.58 with renal ultrasound completed that was negative for hydronephrosis. Low-dose Lasix decreased to 20 mg daily due to renal insufficiency. Developed low-grade fever with chest x-ray negative and urine studies pending. Followup by internal medicine and felt to be acute bronchitis and placed on Zithromax x5 days Placed on gentle intravenous fluids and creatinine improved to 1.28. Latest physical therapy notes on December 18 as patient is total assist of 2 for stand to sit and total assist for stand pivot transfers. Ambulation yet to be tested. Physical medicine and rehabilitation was consulted at the request of physical therapy to consider inpatient rehabilitation  services  Comfortable without complaints. No significant cervical pain   Review of Systems   Constitutional: Negative for fever and malaise/fatigue.   Eyes: Negative for blurred vision.   Respiratory: Negative for cough and shortness of breath.   Cardiovascular: Negative for chest pain.   Gastrointestinal: Positive for constipation. Negative for nausea.   Genitourinary: Negative for dysuria.   Musculoskeletal: Positive for back pain.   Skin: Negative.   Neurological: Negative for dizziness and headaches.   Psychiatric/Behavioral: The patient has insomnia      Past Medical History   Diagnosis  Date   .  Coronary artery disease     .  Arthritis     .  oral control     .  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  Hypertension         stress test done    [facility] in [place]   .  Myocardial infarction         ,2003,      .  Anxiety     .  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  Back surgery     .  Coronary angioplasty         2003      [facility]   .  Joint replacement  left total knee 2005       both shoulders   .  Posterior cervical fusion/foraminotomy  07/25/2011  Procedure: POSTERIOR CERVICAL FUSION/FORAMINOTOMY LEVEL 1;  Surgeon: Karn Cassis;  Location: [facility];  Service: Neurosurgery;  Laterality: N/A;  Cervical Three-Four Laminectomy, Lateral Mass screws      No family history on file. Social History:  reports that he quit smoking about 26 years ago. He does not have any smokeless tobacco history on file. He reports that he drinks alcohol. He reports that he does not use illicit drugs. Allergies:   Allergies   Allergen  Reactions   .  Neurontin (Gabapentin)  Other (See Comments)       Unknown reaction       Medications Prior to Admission   Medication  Sig  Dispense  Refill   .  amLODipine (NORVASC) 10 MG tablet  Take 10 mg by  mouth daily.           Marland Kitchen  aspirin EC 81 MG tablet  Take 81 mg by mouth daily.           Marland Kitchen  glipiZIDE (GLUCOTROL) 10 MG tablet  Take 10 mg by mouth daily.           .  metFORMIN (GLUCOPHAGE) 1000 MG tablet  Take 1,000 mg by mouth 2 (two) times daily with a meal.           .  metoprolol succinate (TOPROL-XL) 25 MG 24 hr tablet  Take 25 mg by mouth daily.           .  Multiple Vitamin (MULTIVITAMIN) tablet  Take 1 tablet by mouth daily.           .  Omega-3 Fatty Acids (FISH OIL) 1200 MG CAPS  Take 1 capsule by mouth daily.           .  quinapril (ACCUPRIL) 40 MG tablet  Take 40 mg by mouth daily.                  Lab Results  Component Value Date   NA 134* 08/02/2011   K 4.1 08/02/2011   CL 95* 08/02/2011   CO2 29 08/02/2011   Lab Results  Component Value Date   WBC 12.5* 08/01/2011   HGB 11.5* 08/01/2011   HCT 34.7* 08/01/2011   MCV 94.0 08/01/2011   PLT 291 08/01/2011   CBG (last 3)   Basename 08/02/11 0734 08/01/11 2150 08/01/11 1644  GLUCAP 254* 272* 241*   BP 179/79  Pulse 90  Temp(Src) 98.7 F (37.1 C) (Oral)  Resp 22  Ht 5\' 10"  (1.778 m)  Wt 93 kg (205 lb 0.4 oz)  BMI 29.42 kg/m2  SpO2 91%     There were no vitals taken for this visit.   Physical Exam  Constitutional: He is oriented to person, place, and time. He appears well-developed.   HENT:   Head: Normocephalic.   Neck: No thyromegaly present.   Cardiovascular: Normal rate and regular rhythm.   Pulmonary/Chest: Breath sounds normal. He has no wheezes.   Abdominal: He exhibits no distension. There is no tenderness.  Musculoskeletal: He exhibits no edema.  Neurological: He is alert and oriented to person, place, and time.  Deltoid strength 2/5 left 3/5 right. Biceps 3-3+ triceps 3-3+ wrist extensors 3+ hand intrinsics 3. Hip flexors 2+ knee extensors 3+ to 4 ankle dorsiflexors plantar flexors 4/5. Sensation diminished in both hands as well as throughout the distal aspects of the legs in a stocking  glove distribution from the knees downward. Reflexes are grossly 1+ throughout.  Cognitively patient is generally alert and appropriate. Cranial nerve exam is intact.   Skin:  Surgical site clean and dry - steri strips in place Psychiatric: His behavior is normal Extremities 2+ edema in the pre-tibial area, no calf tenderness to palpation. There is reduced ankle range of motion bilaterally        Post Admission Physician Evaluation: Functional deficits secondary  to cervical myelopathy status post C3-C4 decompression and fusion. Patient has a spastic incomplete quadriparesis. Patient is admitted to receive collaborative, interdisciplinary care between the physiatrist, rehab nursing staff, and therapy team. Patient's level of medical complexity and substantial therapy needs in context of that medical necessity cannot be provided at a lesser intensity of care such as a SNF. Patient has experienced substantial functional loss from his/her baseline which was documented above under the "Functional History" and "Functional Status" headings.  Judging by the patient's diagnosis, physical exam, and functional history, the patient has potential for functional progress which will result in measurable gains while on inpatient rehab.  These gains will be of substantial and practical use upon discharge  in facilitating mobility and self-care at the household level. Physiatrist will provide 24 hour management of medical needs as well as oversight of the therapy plan/treatment and provide guidance as appropriate regarding the interaction of the two. 24 hour rehab nursing will assist with bladder management, bowel management, safety, skin/wound care, disease management, medication administration, pain management and patient education  and help integrate therapy concepts, techniques,education, etc. PT will assess and treat for:  Pre-gait training, gait training, endurance, safety, equipment.  Goals are: Modified  independent with transfers revision to min assist with ambulation. OT will assess and treat for: ADLs, safety, endurance, equipment.   Goals are: Modified independent upper body dressing and min assist lower body dressing and bathing. SLP will assess and treat for: Not applicable.  Goals are: Not applicable. Case Management and Social Worker will assess and treat for psychological issues and discharge planning. Team conference will be held weekly to assess progress toward goals and to determine barriers to discharge. Patient will receive at least 3 hours of therapy per day at least 5 days per week. ELOS and Prognosis: 2-3 weeks good     Medical Problem List and Plan: 1. Cervical stenosis with myelopathy. Status post cervical 3-4 laminectomy with decompression of spinal and bilateral foraminotomies December 14. No plan for cervical collar 2.. DVT Prophylaxis/Anticoagulation: Dopplers lower extremity negative. Continuous Lovenox as directed. Monitor platelet counts any signs of bleeding. 3.. Pain Management: Oxycodone as needed as well as scheduled Elavil at bedtime. Monitor with increased activity 4. Diabetes mellitus. Hemoglobin A1c of 7.4. Continue Glucotrol 10 mg daily . Glucophage had been resumed. Check blood sugars a.c. and at bedtime. Provide full diabetic teaching.  Will  Continue SSI coverage. 5 Chronic renal insufficiency with creatinine 1.5. Monitor hydration and followup labs. Low-dose Lasix resume the 20 mg daily. 6. Hypertension.  Toprol 100 mg daily. Monitor with increased activity. Quinapril recently discontinued due to elevated creatinine. 7. Acute bronchitis. Zithromax x5 days. Continue Mucinex and incentive spirometry. 8. Lower extremity edema he has been off his usual Lasix. We will need to also put him on compression stockings Christopher [name]

## 2011-08-02 NOTE — Progress Notes (Signed)
Physical Therapy Note  Patient Details  Name: Christopher [name] MRN: [id number] Date of Birth: 1941/07/15 Today's Date: 08/02/2011 Time; 0865-7846 (45') Precautions: Fall risk due to severe B LE pains with weight bearing since  c-spine surgery, spinal precautions.  W/C management; (15')  Patient was instructed in w/c parts management and required min-A and both verbal and tactile cues for safe use. Patient propelled w/c 100' with min-A and verbal cues for safety. Therapeutic Activities; (15') Transfer training bed to/from w/c with Mod-A for squat pivot transfer with special attention to blocking feet and knees from sliding on floor.  Transfers from sit-to-stand with Max-A.  Therapeutic Exercise; (15') B LE manually resisted exercise for Hip flexion, hip abduction/adduction, knee flexion, LAQ with dorsiflexion  Individual Treatment Session   Jodelle Gross 08/02/2011, 1:17 PM

## 2011-08-02 NOTE — Progress Notes (Signed)
Occupational  Therapy Note  Patient Details  Name: Christopher [name] MRN: [id number] Date of Birth: 03-08-1941 Today's Date: 08/02/2011 Time:  0945-1100 ( 75 mins) Individual Therapy  Pain:  10/10 in BUE:  See Mar  Engaged in bathing and dressing at EOB with emphasis on lateral leans, transfers, and introduction to Adaptive equipment.  Pt. Needed manual facilitation for lateral weight shifts with instructional cues for dissassociation of shoulders from hips.     Humberto Seals 08/02/2011, 11:01 AM

## 2011-08-02 NOTE — Progress Notes (Signed)
Occupational Therapy Note  Patient Details  Name: Christopher [name] MRN: [id number] Date of Birth: [birth year] Today's Date: 08/02/2011 Pain:  None Time:  1500-1610  (40  Mins) Individual Therapy  Engaged in Toilet transfer training from wc to dropped arm commode chair.  Instructed pt on lateral scoots using verbal, tactile, and manual facilitation.  Reiterated to work towards  Rib elongation to facilitate smooth coordinated movements.  Pt. Was moderate assist with transfer and total assist with clothes manipulation.   Humberto Seals 08/02/2011, 4:44 PM

## 2011-08-02 NOTE — Progress Notes (Signed)
Physical Therapy Assessment and Plan  Patient Details  Name: Christopher [name] MRN: [id number] Date of Birth: 1941-02-08  PT Diagnosis: Abnormality of gait, Difficulty walking and Muscle weakness Rehab Potential: Good ELOS: 2-3 weeks   Today's Date: 08/02/2011 Time: 0915-1000 (45')  Assessment & Plan Clinical Impression: Patient is a 70 y.o. year old male with recent admission to the hospital on HPI: 70 year old right-handed male with history of cervical stenosis and fusion in the past of cervical 4-7. Admitted December 14 with progressive weakness in both upper extremities. X-ray and imaging showed severe stenosis with radiculopathy at the level of cervical 3-4. The area between cervical 4 and cervical 7 looked good from prior surgery.  Underwent bilateral cervical 3-4 laminectomy with decompression of spinal cord, bilateral foraminotomies on December 14 by Dr. Jeral Fruit  . Patient transferred to CIR on 08/01/2011 . 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  Prior to hospitalization, patient was Mod-Independent with mobility and lived with Spouse in a House home.  Home access is 1Other (comment) (one step onto porch, then small landing to enter). Patient will benefit from skilled PT intervention to maximize safe functional mobility, minimize fall risk and decrease caregiver burden for planned discharge home with 24 hour supervision.  Anticipate patient will benefit from follow up HH at discharge.  PT Assessment Rehab Potential: Good Barriers to Discharge: None PT Plan PT Frequency: 1-2 X/day, 60-90 minutes Estimated Length of Stay: 2-3 weeks PT Treatment/Interventions:  Ambulation/gait training;Balance/vestibular training;DME/adaptive equipment instruction;Functional mobility training;Pain management;Patient/family education;Stair training;Therapeutic Activities;Therapeutic Exercise;UE/LE Strength taining/ROM;UE/LE Coordination activities;Wheelchair propulsion/positioning PT Recommendation Follow Up Recommendations: Home health PT;24 hour supervision/assistance  Precautions/Restrictions Precautions Precautions: Other (comment);Fall Precaution Comments: spinal (cervical) Required Braces or Orthoses: No Restrictions Weight Bearing Restrictions: No General @[email or web address] ([phone]::1) Vital Signs Therapy Vitals Temp: 98.8 F (37.1 C) Temp src: Oral Pulse Rate: 95  Resp: 22  BP: 145/85 mmHg Patient Position, if appropriate: Sitting Oxygen Therapy SpO2: 97 % O2 Device: None (Room air) Pain Pain Assessment Pain Assessment: 0-10 Pain Score:   6 Pain Type: Surgical pain;Neuropathic pain Pain Location: Neck Pain Orientation: Posterior Pain Descriptors: Aching;Sore Pain Frequency: Intermittent Pain Onset: Gradual Pain Intervention(s): Medication (See eMAR) Home Living/Prior Functioning Home Living Lives With: Spouse Type of Home: House Home Layout: One level;Full bath on main level Home Access: Other (comment) (one step onto porch, then small landing to enter) Entrance Stairs-Rails: None Entrance Stairs-Number of Steps: 1 Bathroom Shower/Tub: Health visitor: Standard Bathroom Accessibility: Yes How Accessible: Accessible via walker Prior Function Level of Independence: Requires assistive device for independence Able to Take Stairs?: Yes Driving: Yes Vocation: Retired 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 Vision/Perception  Vision - History Baseline Vision: No visual deficits Vision - Assessment Eye Alignment: Within Functional Limits Perception Perception: Within Functional Limits Praxis Praxis:  Intact  Cognition Overall Cognitive Status: Appears within functional limits for tasks assessed Orientation Level: Oriented X4 Sensation Sensation Light Touch: Appears Intact (B LE's) Coordination Heel Shin Test: [facility] Motor  Motor Motor: Abnormal postural alignment and control (Severe pain into B LE's with weight bearing limits posture)  Mobility   Locomotion  Stairs / Additional Locomotion Stairs: No (unable to assess safely) Wheelchair Mobility Wheelchair Mobility: Yes Wheelchair Assistance: 4: Administrator, sports  Details: Tactile cues for placement;Verbal cues for precautions/safety Wheelchair Propulsion: Both upper extremities;Both lower extermities Wheelchair Parts Management: Needs assistance Distance: 100'  Trunk/Postural Assessment    Please see PT Evaluation in Navigator for details Balance   Please see PT Evaluation in Navigator for details Extremity Assessment    Please see PT Evaluation in Navigator for details        Recommendations for other services: None  Discharge Criteria: Patient will be discharged from PT if patient refuses treatment 3 consecutive times without medical reason, if treatment goals not met, if there is a change in medical status, if patient makes no progress towards goals or if patient is discharged from hospital.  The above assessment, treatment plan, treatment alternatives and goals were discussed and mutually agreed upon: by patient  Jodelle Gross 08/02/2011, 5:29 PM

## 2011-08-03 LAB — BASIC METABOLIC PANEL
CO2: 30 mEq/L (ref 19–32)
Calcium: 9.2 mg/dL (ref 8.4–10.5)
Creatinine, Ser: 1.11 mg/dL (ref 0.50–1.35)
Glucose, Bld: 260 mg/dL — ABNORMAL HIGH (ref 70–99)

## 2011-08-03 LAB — GLUCOSE, CAPILLARY
Glucose-Capillary: 225 mg/dL — ABNORMAL HIGH (ref 70–99)
Glucose-Capillary: 235 mg/dL — ABNORMAL HIGH (ref 70–99)

## 2011-08-03 MED ORDER — METFORMIN HCL 500 MG PO TABS
1000.0000 mg | ORAL_TABLET | Freq: Two times a day (BID) | ORAL | Status: DC
  Administered 2011-08-03 – 2011-08-09 (×13): 1000 mg via ORAL
  Filled 2011-08-03 (×15): qty 2

## 2011-08-03 MED ORDER — INSULIN GLARGINE 100 UNIT/ML ~~LOC~~ SOLN
20.0000 [IU] | Freq: Every day | SUBCUTANEOUS | Status: DC
  Administered 2011-08-03: 20 [IU] via SUBCUTANEOUS
  Filled 2011-08-03: qty 3

## 2011-08-03 NOTE — Progress Notes (Signed)
Occupational Therapy Session Note  Patient Details  Name: Christopher [name] MRN: [id number] Date of Birth: [birth year]  Today's Date: 08/03/2011 Time: 1300-1330 Time Calculation (min): 30 min  Skilled Therapeutic Interventions/Progress Updates:  Patient seen this pm for 1:1 OT session to further address functional mobility, e.g toilet transfer, toileting, and transitioning sit to/from stand with decreased use of upper extremities.  Patient able to walk short distances (15 feet) with rolling walker and min assist.  Sit to stand varies from min to mod assist depending on surface height and pain.  Cueing proper alignment of feet for transition important for success.  Wife and grand daughter present for OT session.      Pain 4/10 constant, neck  Therapy/Group: Individual Therapy  [name] 08/03/2011, 2:43 PM

## 2011-08-03 NOTE — Progress Notes (Signed)
Occupational Therapy Session Note  Patient Details  Name: Christopher [name] MRN: [id number] Date of Birth: [birth year]  Today's Date: 08/03/2011 Time: 1003-1059 Time Calculation (min): 56 min   Skilled Therapeutic Interventions/Progress Updates:    Patient seen this am for bathing and dressing session.  Patient able to squat pivot transfer to level surface with min assist to toilet.  Patient able to pull up with grab bar to stand to adjust LB clothing.  Patient with pitting edema bilateral lower extremities.  Noted TED hose ordered.  TED hose issued.  Educated patient on use of ted hose, added elevating leg rests to his wheelchair, and encouraged him to request back to bed with feet up during rest periods.       Pain 4/10 neck with intermittent spasmsPatient seen this am  Therapy/Group: Individual Therapy Patient reports this is where he tries to keep his pain.  Has had pain medicine  [name] 08/03/2011, 11:00 AM

## 2011-08-03 NOTE — Progress Notes (Signed)
Patient ID: Christopher [name], male   DOB: [birth year], 70 y.o.   MRN: [id number] Patient ID: Christopher [name], male   DOB: [birth year], 70 y.o.   MRN: [id number]    Patient Information       Patient Name Sex DOB SSN    Christopher [name], Christopher [name] Male [birth year] [id number]          Christopher [name] is an 70 y.o. male.    No chief complaint on file. : HPI: 70 year old right-handed male with history of cervical stenosis and fusion in the past of cervical 4-7. Admitted December 14 with progressive weakness in both upper extremities. X-ray and imaging showed severe stenosis with radiculopathy at the level of cervical 3-4. The area between cervical 4 and cervical 7 looked good from prior surgery.   Underwent bilateral cervical 3-4 laminectomy with decompression of spinal cord, bilateral foraminotomies on December 14 by Dr. Jeral Fruit. No plan for cervical collar. Postoperative pain control with morphine/MSIR as needed. Patient with some increased lower extremity swelling. Echocardiogram with ejection fraction 55% and grade 1 diastolic dysfunction.  Bilateral lower extremity  Doppler study completed on December 17 that were negative for deep vein thrombosis. Ventilation perfusion scan also negative for pulmonary emboli. Placed on subcutaneous Lovenox for deep vein prophylaxis. Mild elevation in creatinine to 2.0 from a baseline of 1.58 with renal ultrasound completed that was negative for hydronephrosis. Low-dose Lasix decreased to 20 mg daily due to renal insufficiency. Developed low-grade fever with chest x-ray negative and urine studies pending. Followup by internal medicine and felt to be acute bronchitis and placed on Zithromax x5 days Placed on gentle intravenous fluids and creatinine improved to 1.28. Latest physical therapy notes on December 18 as patient is total assist of 2 for stand to sit and total assist for stand pivot transfers. Ambulation yet to be tested. Physical medicine and rehabilitation was consulted at the  request of physical therapy to consider inpatient rehabilitation services  Comfortable without complaints. No significant cervical pain. BS remain in the  250-350 range;  prior to the admit had been controlled on oral therapy   Review of Systems   Constitutional: Negative for fever and malaise/fatigue.   Eyes: Negative for blurred vision.   Respiratory: Negative for cough and shortness of breath.   Cardiovascular: Negative for chest pain.   Gastrointestinal: Positive for constipation. Negative for nausea.   Genitourinary: Negative for dysuria.   Musculoskeletal: Positive for back pain.   Skin: Negative.   Neurological: Negative for dizziness and headaches.   Psychiatric/Behavioral: The patient has insomnia      Past Medical History   Diagnosis  Date   .  Coronary artery disease     .  Arthritis     .  oral control     .  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  Hypertension         stress test done    [facility] in [place]   .  Myocardial infarction         ,2003,      .  Anxiety     .  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  Back surgery     .  Coronary angioplasty         2003  [facility]   .  Joint replacement  left total knee 2005       both shoulders   .  Posterior cervical fusion/foraminotomy  07/25/2011       Procedure: POSTERIOR CERVICAL FUSION/FORAMINOTOMY LEVEL 1;  Surgeon: Karn Cassis;  Location: [facility];  Service: Neurosurgery;  Laterality: N/A;  Cervical Three-Four Laminectomy, Lateral Mass screws      No family history on file. Social History:  reports that he quit smoking about 26 years ago. He does not have any smokeless tobacco history on file. He reports that he drinks alcohol. He reports that he does not use illicit drugs. Allergies:   Allergies   Allergen  Reactions   .  Neurontin (Gabapentin)  Other (See Comments)        Unknown reaction       Medications Prior to Admission   Medication  Sig  Dispense  Refill   .  amLODipine (NORVASC) 10 MG tablet  Take 10 mg by mouth daily.           Marland Kitchen  aspirin EC 81 MG tablet  Take 81 mg by mouth daily.           Marland Kitchen  glipiZIDE (GLUCOTROL) 10 MG tablet  Take 10 mg by mouth daily.           .  metFORMIN (GLUCOPHAGE) 1000 MG tablet  Take 1,000 mg by mouth 2 (two) times daily with a meal.           .  metoprolol succinate (TOPROL-XL) 25 MG 24 hr tablet  Take 25 mg by mouth daily.           .  Multiple Vitamin (MULTIVITAMIN) tablet  Take 1 tablet by mouth daily.           .  Omega-3 Fatty Acids (FISH OIL) 1200 MG CAPS  Take 1 capsule by mouth daily.           .  quinapril (ACCUPRIL) 40 MG tablet  Take 40 mg by mouth daily.                  Lab Results  Component Value Date   NA 134* 08/02/2011   K 4.1 08/02/2011   CL 95* 08/02/2011   CO2 29 08/02/2011   Lab Results  Component Value Date   WBC 12.5* 08/01/2011   HGB 11.5* 08/01/2011   HCT 34.7* 08/01/2011   MCV 94.0 08/01/2011   PLT 291 08/01/2011   CBG (last 3)   Basename 08/03/11 0727 08/02/11 2110 08/02/11 1649  GLUCAP 261* 251* 288*   BP 115/71  Pulse 95  Temp(Src) 98.6 F (37 C) (Oral)  Resp 20  Ht 5\' 10"  (1.778 m)  Wt 93 kg (205 lb 0.4 oz)  BMI 29.42 kg/m2  SpO2 92%     There were no vitals taken for this visit.   Physical Exam  Constitutional: He is oriented to person, place, and time. He appears well-developed.   HENT:   Head: Normocephalic.   Neck: No thyromegaly present.   Cardiovascular: Normal rate and regular rhythm.   Pulmonary/Chest: Breath sounds normal. He has no wheezes.   Abdominal: He exhibits no distension. There is no tenderness.  Musculoskeletal: He exhibits no edema.  Neurological: He is alert and oriented to person, place, and time.  Deltoid strength 2/5 left 3/5 right. Biceps 3-3+ triceps 3-3+ wrist extensors 3+ hand intrinsics 3. Hip flexors 2+ knee extensors 3+  to 4 ankle dorsiflexors plantar flexors 4/5. Sensation diminished in both hands as well as throughout the distal aspects of the legs in a stocking glove distribution from the knees downward. Reflexes are grossly 1+ throughout. Cognitively patient is generally alert and appropriate. Cranial nerve exam is intact.   Skin:  Surgical site clean and dry - steri strips in place Psychiatric: His behavior is normal Extremities 2+ edema in the pre-tibial area, no calf tenderness to palpation. There is reduced ankle range of motion bilaterally        Post Admission Physician Evaluation: Functional deficits secondary  to cervical myelopathy status post C3-C4 decompression and fusion. Patient has a spastic incomplete quadriparesis. Patient is admitted to receive collaborative, interdisciplinary care between the physiatrist, rehab nursing staff, and therapy team. Patient's level of medical complexity and substantial therapy needs in context of that medical necessity cannot be provided at a lesser intensity of care such as a SNF. Patient has experienced substantial functional loss from his/her baseline which was documented above under the "Functional History" and "Functional Status" headings.  Judging by the patient's diagnosis, physical exam, and functional history, the patient has potential for functional progress which will result in measurable gains while on inpatient rehab.  These gains will be of substantial and practical use upon discharge  in facilitating mobility and self-care at the household level. Physiatrist will provide 24 hour management of medical needs as well as oversight of the therapy plan/treatment and provide guidance as appropriate regarding the interaction of the two. 24 hour rehab nursing will assist with bladder management, bowel management, safety, skin/wound care, disease management, medication administration, pain management and patient education  and help integrate therapy concepts,  techniques,education, etc. PT will assess and treat for:  Pre-gait training, gait training, endurance, safety, equipment.  Goals are: Modified independent with transfers revision to min assist with ambulation. OT will assess and treat for: ADLs, safety, endurance, equipment.   Goals are: Modified independent upper body dressing and min assist lower body dressing and bathing. SLP will assess and treat for: Not applicable.  Goals are: Not applicable. Case Management and Social Worker will assess and treat for psychological issues and discharge planning. Team conference will be held weekly to assess progress toward goals and to determine barriers to discharge. Patient will receive at least 3 hours of therapy per day at least 5 days per week. ELOS and Prognosis: 2-3 weeks good     Medical Problem List and Plan: 1. Cervical stenosis with myelopathy. Status post cervical 3-4 laminectomy with decompression of spinal and bilateral foraminotomies December 14. No plan for cervical collar 2.. DVT Prophylaxis/Anticoagulation: Dopplers lower extremity negative. Continuous Lovenox as directed. Monitor platelet counts any signs of bleeding. 3.. Pain Management: Oxycodone as needed as well as scheduled Elavil at bedtime. Monitor with increased activity 4. Diabetes mellitus. Hemoglobin A1c of 7.4. Continue Glucotrol 10 mg daily . Glucophage had been resumed. Check blood sugars a.c. and at bedtime. Provide full diabetic teaching.  Will  Continue SSI coverage, add Lantus insulin and increase metformin to 1000 mg twice daily 5 Chronic renal insufficiency with creatinine 1.5. Monitor hydration and followup labs. Low-dose Lasix resume the 20 mg daily. 6. Hypertension.  Toprol 100 mg daily. Monitor with increased activity. Quinapril recently discontinued due to elevated creatinine. 7. Acute bronchitis. Zithromax x5 days. Continue Mucinex and incentive spirometry. 8. Lower extremity edema he has been off his usual  Lasix. We will need to also put him on compression stockings Claudette Laws E

## 2011-08-03 NOTE — Progress Notes (Signed)
Weakness to all extremities, BLE's > BUE's.  C/O spasms and prn Robaxin given at 1952. Later C/O pain to bilateral feet, 2 percocet given at 2111 & 0325. BLE with 2 plus pitting edema. Awake at 0300 coughing, patient reports non-productive. Christopher [name]

## 2011-08-03 NOTE — Progress Notes (Signed)
Physical Therapy Session Note  Patient Details  Name: Christopher [name] MRN: [id number] Date of Birth: [birth year]  Today's Date: 08/03/2011 Time: 0900-0950 and 11:00  - 11:45 Time Calculation (min): 50 min and 45 min  Precautions: Precautions Precautions: Other (comment);Fall Precaution Comments: spinal (cervical) Required Braces or Orthoses: No Restrictions Weight Bearing Restrictions: No  Short Term Goals: PT Short Term Goal 1: Patient will be able to perform bed mobility at Supervised assist level PT Short Term Goal 2: Patient will be able to perform all transfers at min-assist level. PT Short Term Goal 3: Patient will perform w/c mobility with Supervised assist level x 150' PT Short Term Goal 4: Patient will be able to ambulate using least restrictive device x 30' with min-Assist in home environment  Skilled Therapeutic Interventions/Progress Updates:  Patient transferred wheelchair to mat with min assist to steady wheelchair and keep feet from sliding. Patient needed assist with wheelchair set up. Patient sit to supine with supervision. Patient performed 25 reps of LE exercises including ankle pumps, heel slides, and SAQ's. Patient with right knee pain with extension.   Patient with bilateral edema. Elevating legrests added to wheelchair.      Pain Pain Assessment Pain Assessment: 0-10 Pain Score:   3 Pain Type: Acute pain Pain Location: Knee Pain Orientation: Right Pain Descriptors: Aching Pain Frequency: Intermittent Pain Onset: On-going Pain Intervention(s):Patient reports nurse had already given pain medication. Mobility  Patient requires assistance for raising and lowering from chair - max assist sit to stand. Patient ambulated in parallel bars x 10 feet forward and backward with min assist for balance. Patient ambulated with rolling walker 25 feet x 2 with min assist for posterior balance. Patient reports significant pain in right knee during ambulation and sit to  stand. Locomotion  Wheelchair Mobility Distance: 150   Therapy/Group: Individual Therapy  [name] 08/03/2011, 9:55 AM

## 2011-08-04 LAB — BASIC METABOLIC PANEL
Calcium: 9.1 mg/dL (ref 8.4–10.5)
Creatinine, Ser: 1 mg/dL (ref 0.50–1.35)
GFR calc non Af Amer: 75 mL/min — ABNORMAL LOW (ref >90)
Glucose, Bld: 195 mg/dL — ABNORMAL HIGH (ref 70–99)
Sodium: 137 mEq/L (ref 135–145)

## 2011-08-04 LAB — GLUCOSE, CAPILLARY

## 2011-08-04 MED ORDER — FUROSEMIDE 20 MG PO TABS
20.0000 mg | ORAL_TABLET | ORAL | Status: AC
  Administered 2011-08-04: 20 mg via ORAL
  Filled 2011-08-04: qty 1

## 2011-08-04 MED ORDER — INSULIN GLARGINE 100 UNIT/ML ~~LOC~~ SOLN
24.0000 [IU] | Freq: Every day | SUBCUTANEOUS | Status: DC
  Administered 2011-08-04 – 2011-08-08 (×5): 24 [IU] via SUBCUTANEOUS

## 2011-08-04 MED ORDER — FUROSEMIDE 40 MG PO TABS
40.0000 mg | ORAL_TABLET | Freq: Every day | ORAL | Status: DC
  Administered 2011-08-05 – 2011-08-09 (×5): 40 mg via ORAL
  Filled 2011-08-04 (×7): qty 1

## 2011-08-04 NOTE — Progress Notes (Signed)
Physical Therapy Note  Patient Details  Name: Christopher [name] MRN: [id number] Date of Birth: [birth year] Today's Date: 08/04/2011  1345-1415 (30 minutes) individual treatment Pain- 5/10 neck/low back - nurse notified [facility] given Transfers -scoot wc to/from mat SBA for safety Focus of treatment: bilateral LE strengthening including heel slides, hip abduction, SAQs (X 15), sit to stand X 5 from mat WC mobility : 120 feet SBA with one rest break   Daleyssa Loiselle,JIM 08/04/2011, 2:43 PM

## 2011-08-04 NOTE — Progress Notes (Signed)
Patient ID: Christopher [name], male   DOB: [birth year], 70 y.o.   MRN: [id number] Subjective/Complaints: Review of Systems  HENT: Positive for neck pain.   Musculoskeletal: Positive for joint pain.  Neurological: Positive for tingling.  All other systems reviewed and are negative.  Denies dizziness.   Objective: Vital Signs: Blood pressure 151/75, pulse 84, temperature 98 F (36.7 C), temperature source Oral, resp. rate 20, height 5\' 10"  (1.778 m), weight 93 kg (205 lb 0.4 oz), SpO2 93.00%. No results found.  Basename 08/01/11 1635  WBC 12.5*  HGB 11.5*  HCT 34.7*  PLT 291    Basename 08/03/11 0720 08/02/11 0720  NA 136 134*  K 4.1 4.1  CL 97 95*  CO2 30 29  GLUCOSE 260* 287*  BUN 34* 40*  CREATININE 1.11 1.21  CALCIUM 9.2 10.0   CBG (last 3)   Basename 08/03/11 2029 08/03/11 1651 08/03/11 1148  GLUCAP 235* 249* 225*    Wt Readings from Last 3 Encounters:  08/01/11 93 kg (205 lb 0.4 oz)  07/25/11 94.1 kg (207 lb 7.3 oz)  07/25/11 94.1 kg (207 lb 7.3 oz)    Physical Exam:  General appearance: alert, cooperative and no distress Head: Normocephalic, without obvious abnormality, atraumatic Eyes: conjunctivae/corneas clear. PERRL, EOM's intact. Fundi benign. Ears: normal TM's and external ear canals both ears Nose: Nares normal. Septum midline. Mucosa normal. No drainage or sinus tenderness. Throat: lips, mucosa, and tongue normal; teeth and gums normal Neck: no adenopathy, no carotid bruit, no JVD, supple, symmetrical, trachea midline and thyroid not enlarged, symmetric, no tenderness/mass/nodules Back: symmetric, no curvature. ROM normal. No CVA tenderness. Resp: clear to auscultation bilaterally Cardio: regular rate and rhythm, S1, S2 normal, no murmur, click, rub or gallop GI: soft, non-tender; bowel sounds normal; no masses,  no organomegaly Extremities: RLE 2+ edema.  LLE 1+ edema.  skin intact Pulses: 2+ and symmetric Skin: Skin color, texture, turgor normal. No  rashes or lesions Neurologic: Deltoids 2+/5 Biceps 3+/5,Triceps 3/5.  Wrist and hands 3+ to 4- / 5.  Lower extrs' 3-4/5.  Sensation 1/2 in hands and lower exts.  DTR's 1+.  Cognitively intact. Incision/Wound: clean and intact   Assessment/Plan: 1. Functional deficits secondary to cervical stenosis with myelopathy which require 3+ hours per day of interdisciplinary therapy in a comprehensive inpatient rehab setting. Physiatrist is providing close team supervision and 24 hour management of active medical problems listed below. Physiatrist and rehab team continue to assess barriers to discharge/monitor patient progress toward functional and medical goals. Mobility: Bed Mobility Bed Mobility: Yes Rolling Right: 5: Supervision;With rail Rolling Left: 5: Supervision;With rail Right Sidelying to Sit: 4: Min assist Right Sidelying to Sit Details (indicate cue type and reason): pt. scooted to edge of bed with minimal assist Transfers Transfers: Yes Sit to Stand: 2: Max assist;With upper extremity assist Sit to Stand Details (indicate cue type and reason): unable Stand to Sit: 2: Max assist;With upper extremity assist Stand Pivot Transfers: 2: Max assist Lateral/Scoot Transfers: 1: +2 Total assist Lateral/Scoot Transfer Details (indicate cue type and reason): +2 for transfers to wc (pt=50%) Ambulation/Gait Ambulation/Gait Assistance: 3: Mod assist Ambulation Distance (Feet): 5 Feet Assistive device: 1 person hand held assist Gait Pattern: Step-through pattern;Decreased step length - right;Decreased step length - left;Right flexed knee in stance;Left flexed knee in stance Stairs: No (unable to assess safely) Wheelchair Mobility Wheelchair Mobility: Yes Wheelchair Assistance: 4: Min Education officer, museum: Both upper extremities;Both lower extermities Wheelchair Parts Management: Needs assistance Distance:  150 ADL:   Cognition: Cognition Overall Cognitive Status: Appears within  functional limits for tasks assessed Arousal/Alertness: Awake/alert Orientation Level: Oriented X4 Attention: Selective Selective Attention: Appears intact Memory: Appears intact Awareness: Appears intact Problem Solving: Appears intact Safety/Judgment: Appears intact Cognition Arousal/Alertness: Awake/alert Orientation Level: Oriented X4   1. Cervical stenosis with myelopathy. Status post cervical 3-4 laminectomy with decompression of spinal and bilateral foraminotomies December 14. No plan for cervical collar.  Encouraged good posture.  2.. DVT Prophylaxis/Anticoagulation: Dopplers lower extremity negative. Continuous Lovenox as directed. Monitor platelet counts any signs of bleeding.  3.. Pain Management: Oxycodone as needed as well as scheduled Elavil at bedtime. Monitor with increased activity.  Seems to be doing fairly well at present.  4. Diabetes mellitus. Hemoglobin A1c of 7.4. Continue Glucotrol 10 mg daily . Glucophage  resumed. Check blood sugars a.c. and at bedtime. Provide full diabetic teaching. Sugars still elevated.  Restrict diet a bit further. Consider further titration of lantus.   5 Chronic renal insufficiency with creatinine 1.5. Monitor hydration and followup labs. Low-dose Lasix resume the 20 mg daily.   6. Hypertension. Toprol 100 mg daily. Monitor with increased activity. Quinapril recently discontinued due to elevated creatinine.  -recheck labs Wednesday    7. Acute bronchitis. Zithromax x5 days. Continue Mucinex and incentive spirometry.  8. Lower extremity edema: increase lasix to 40qd. KHT  -elevate legs.  -check labs Wednesday.       LOS (Days) 3 A FACE TO FACE EVALUATION WAS PERFORMED  Jerrold Haskell T 08/04/2011, 7:14 AM

## 2011-08-04 NOTE — Progress Notes (Signed)
Physical Therapy Session Note  Patient Details  Name: Christopher [name] MRN: [id number] Date of Birth: [birth year]  Today's Date: 08/04/2011 TIME IN/OUT [id number]   Time:Precautions: Precautions Precautions: Fall Precaution Comments: restrict excessive cervical ROM per MD Required Braces or Orthoses: No Restrictions Weight Bearing Restrictions: No  Short Term Goals: PT Short Term Goal 1: Patient will be able to perform bed mobility at Supervised assist level PT Short Term Goal 2: Patient will be able to perform all transfers at min-assist level. PT Short Term Goal 3: Patient will perform w/c mobility with Supervised assist level x 150' PT Short Term Goal 4: Patient will be able to ambulate using least restrictive device x 30' with min-Assist in home environment  Skilled Therapeutic Interventions/Progress Updates:    Mobility training with RW instruction, cues to position feet underneath him before standing and decrease pulling on RW with UE's; added stair goal due to good progress, pt in agreement   General Chart Reviewed: Yes Vital Signs HR 100 with mobility   Pain Pain Assessment Pain Assessment: 0-10 Pain Score:   5 Pain Type: Chronic pain Pain Location: Knee (R knee and neck) Pain Intervention(s): RN made aware Mobility Bed Mobility Supine to Sit: 5: Supervision (to R) Transfers Sit to Stand: 4: Min assist;With upper extremity assist (cues to get feet under ) Stand to Sit: 5: Supervision;With upper extremity assist Stand to Sit Details: intermittent uncontroleed descent Stand Pivot Transfers: 4: Min assist Stand Pivot Transfer Details:  (with RW) Squat Pivot Transfers: 5: Supervision;With armrests;Without upper extremity assistance Squat Pivot Transfer Details (indicate cue type and reason): bed to w/c to L Locomotion  Gait 60 ft, 50 x 2 Ambulation Ambulation/Gait Assistance: 4: Min assist Assistive device: Rolling walker Ambulation/Gait Assistance Details (indicate  cue type and reason): cues to look up, decreased push off and lands on outer L foot, controlled environment Stairs / Additional Locomotion Stairs: Yes Stairs Assistance: 3: Mod assist Stairs Assistance Details: Verbal cues for technique;Visual cues/gestures for precautions/safety;Verbal cues for sequencing Stairs Assistance Details (indicate cue type and reason): first toe taps, then step ups, progressed to up/down 4 steps x 2 mod A d/t 2 uncontrolled descent on 6 in step, otherwise min A Stair Management Technique: Two rails;Forwards       Therapy/Group: Individual Therapy  Michaelene Song 08/04/2011, 11:30 AM

## 2011-08-04 NOTE — Plan of Care (Signed)
General weakness with mobility on lft side better than rt, burning sensation in ext and pain in rt neck controlled with prn medications neuropathy bil LE using elavil, non-prod cough using mucinex, pills whole with water, CBGs controlled with SSI and added HS Lantus. New order for increased lasix today due to edema of lower ext.  No other change in assessment, continue plan of care. Pamelia Hoit

## 2011-08-04 NOTE — Progress Notes (Signed)
Patient information reviewed and entered into [facility] system by Ivonna Kinnick, RN, CRRN, PPS Coordinator.  Information including medical coding and functional independence measure will be reviewed and updated through discharge.     Per nursing patient was given "Data Collection Information Summary for Patients in Inpatient Rehabilitation Facilities with attached "Privacy Act Statement-Health Care Records" upon admission.   

## 2011-08-04 NOTE — Progress Notes (Signed)
Physical Therapy Session Note  Patient Details  Name: Christopher [name] MRN: [id number] Date of Birth: 02-Jun-1941  Today's Date: 08/04/2011 [id number]  - [id number]    Precautions: Precautions Precautions: Fall Precaution Comments: restrict excessive cervical ROM per MD Required Braces or Orthoses: No Restrictions Weight Bearing Restrictions: No  Short Term Goals: PT Short Term Goal 1: Patient will be able to perform bed mobility at Supervised assist level PT Short Term Goal 2: Patient will be able to perform all transfers at min-assist level. PT Short Term Goal 3: Patient will perform w/c mobility with Supervised assist level x 150' PT Short Term Goal 4: Patient will be able to ambulate using least restrictive device x 30' with min-Assist in home environment  Skilled Therapeutic Interventions/Progress Updates:    Therapeutic exercise performed with UE and  LE to increase strength for functional mobility.  Transfer training, toileting.   General Chart Reviewed: Yes Pain Pain Assessment Pain Score:   5 Pain Type: Chronic pain Pain Location: Neck (LE's) Pain Intervention(s): RN made aware Mobility Bed Mobility Supine to Sit: 5: Supervision (to R) Transfers Squat Pivot Transfers: 5: Supervision;With armrests;Without upper extremity assistance Squat Pivot Transfer Details (indicate cue type and reason): bed to w/c to L Stand pivot with grab bar in BR close S Sit to stand with armrest and UE's min A from w/c, uncontrolled descent       Balance  S EOB sitting balance, Min steady A stadning there ex with UE support   Exercises General Exercises - Upper Extremity Shoulder Flexion: AROM;10 reps;Supine;Both Shoulder ABduction: AROM;10 reps;Both;Supine Shoulder ADduction: AROM;10 reps;Supine;Both Shoulder Horizontal ABduction: AROM;10 reps;Both;Supine Elbow Flexion: AROM;10 reps;Supine;Both General Exercises - Lower Extremity Ankle Circles/Pumps: AROM;Both;10 reps;Supine Short Arc  Quad: 10 reps;AROM;Supine;Both Long Arc Quad: Seated;Both;AROM;20 reps Heel Slides: AROM;Both;10 reps;Supine Standing- glut sets, toe raises, minisquats, hip flexion- seated rest breaks required d/t LE discomfort and fatigue with standing exercsie, knee instability in single leg stance  Therapy/Group: Individual Therapy  Michaelene Song 08/04/2011, 8:33 AM

## 2011-08-04 NOTE — Progress Notes (Signed)
Social Work Assessment and Plan Assessment and Plan  Patient Name: Christopher [name]  [id number] Date: 08/04/2011  Problem List:  Patient Active Problem List  Diagnoses  . Myelopathy    Past Medical History:  Past Medical History  Diagnosis Date  . Coronary artery disease   . Arthritis   . oral control   . 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 Hypertension     stress test done    [facility] in [place]  . Myocardial infarction     ,2003,     . Anxiety   . 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 Back surgery   . Coronary angioplasty     2003      [facility]  . Joint replacement left total knee 2005    both shoulders  . Posterior cervical fusion/foraminotomy 07/25/2011    Procedure: POSTERIOR CERVICAL FUSION/FORAMINOTOMY LEVEL 1;  Surgeon: Karn Cassis;  Location: [facility];  Service: Neurosurgery;  Laterality: N/A;  Cervical Three-Four Laminectomy, Lateral Mass screws    Discharge Planning  Discharge Planning Patient expects to be discharged to:: home Case Management Consult Needed:  (following)  Social/Family/Support Systems Social/Family/Support Systems Anticipated Caregiver: wife and son; wife and son both work p/t but able to provide 24/7  Employment Status Employment Status Employment Status: Retired Date Retired/Disabled/Unemployed: 2002 Age Retired: 60  Fish farm manager Issues: none Guardian/Conservator: none  Abuse/Neglect    Emotional Status Emotional Status Pt's affect, behavior adn adjustment status: pleasant, fully oriented gentleman who is soft spoken and not very talkative but does show a good sense of humor.  Denies any s/s of depression or anxiety.  Depression screen = 0.  Denies any emotional distress. Recent Psychosocial Issues: none Pyschiatric History: none  Patient/Family Perceptions,  Expectations & Goals Pt/Family Perceptions, Expectations and Goals Pt/Family understanding of illness & functional limitations: pt and family with basic understanding of surgery performed and precautions to be followed.  This is not his first cervical surgery. 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 Medicare) Financial Resources: Restaurant manager, fast food Screen Referred: No Living Expenses: Database administrator Management: Spouse;Patient Home Management: pt and family Patient/Family Preliminary Plans: home with wife and son who can both provide physical assistance to pt  Clinical Impression:  Pleasant, fully oriented gentleman who is here following cervical surgery (not his first) and motivated to do therapy.  Good family support.  Pt denies any emotional distress but will monitor throughout stay.        Amada Jupiter 08/04/2011

## 2011-08-04 NOTE — Progress Notes (Signed)
Per State Regulation 482.30 This chart was reviewed for medical necessity with respect to the patient's Admission/Duration of stay. Pt participating in therapies. DM uncontrolled. Adjusting meds. Foley d/c'd yesterday. Incontinent of urine at times.  Meryl Dare                 Nurse Care Manager             Next Review Date: 08/08/11

## 2011-08-04 NOTE — Progress Notes (Signed)
Inpatient Rehabilitation Center Individual Statement of Services  Patient Name:  Christopher [name]  Date:  08/04/2011  Welcome to the Inpatient Rehabilitation Center.  Our goal is to provide you with an individualized program based on your diagnosis and situation, designed to meet your specific needs.  With this comprehensive rehabilitation program, you will be expected to participate in at least 3 hours of rehabilitation therapies Monday-Friday, with modified therapy programming on the weekends.  Your rehabilitation program will include the following services:  Physical Therapy (PT), Occupational Therapy (OT), 24 hour per day rehabilitation nursing, Therapeutic Recreaction (TR), Case Management (RN and Child psychotherapist), Rehabilitation Medicine, Nutrition Services and Pharmacy Services  Weekly team conferences will be held on Tuesdays to discuss your progress.  Your RN Case Designer, television/film set will talk with you frequently to get your input and to update you on team discussions.  Team conferences with you and your family in attendance may also be held.  Depending on your progress and recovery, your program may change.  Your RN Case Estate agent will coordinate services and will keep you informed of any changes.  Your RN Sports coach and SW names and contact numbers are listed  below.  The following services may also be recommended but are not provided by the Inpatient Rehabilitation Center:   Driving Evaluations  Home Health Rehabiltiation Services  Outpatient Rehabilitatation [facility]  Vocational Rehabilitation   Arrangements will be made to provide these services after discharge if needed.  Arrangements include referral to agencies that provide these services.  Your insurance has been verified to be: [place] Your primary doctor is:    Pertinent information will be shared with your doctor and your insurance company.  Case Manager: Melanee Spry, [facility] [phone]    Social Worker:  Amada Jupiter, [place] [phone]  ELOS: 2-3 weeks Goal: Minimum assistance - supervision  Information discussed with and copy given to patient by: Meryl Dare, 08/04/2011

## 2011-08-04 NOTE — Progress Notes (Signed)
Occupational Therapy Note  Patient Details  Name: Christopher [name] MRN: [id number] Date of Birth: 02-08-1941 Today's Date: 08/04/2011  4098-1191 - 55 Minutes Individual Therapy Patient complained of 5/10 pain, patient declined any pain medication at this time.  Engaged in ADL retraining at shower level; ambulating with rolling walker into room bathroom. Focused skilled intervention on functional use of bilateral upper extremities, functional ambulation using rolling walker, various transfers using rolling walker, sit to stands, stand to sits, increasing overall activity tolerance/endurance, and grooming tasks seated in w/c at sink.   [name] 08/04/2011, 9:19 AM

## 2011-08-05 LAB — GLUCOSE, CAPILLARY
Glucose-Capillary: 187 mg/dL — ABNORMAL HIGH (ref 70–99)
Glucose-Capillary: 197 mg/dL — ABNORMAL HIGH (ref 70–99)
Glucose-Capillary: 402 mg/dL — ABNORMAL HIGH (ref 70–99)
Glucose-Capillary: 153 mg/dL — ABNORMAL HIGH (ref 70–99)

## 2011-08-05 LAB — BASIC METABOLIC PANEL
CO2: 28 mEq/L (ref 19–32)
Chloride: 98 mEq/L (ref 96–112)
Creatinine, Ser: 1.02 mg/dL (ref 0.50–1.35)
GFR calc Af Amer: 85 mL/min — ABNORMAL LOW (ref >90)
Potassium: 4.3 mEq/L (ref 3.5–5.1)

## 2011-08-05 MED ORDER — INSULIN ASPART 100 UNIT/ML ~~LOC~~ SOLN
15.0000 [IU] | Freq: Once | SUBCUTANEOUS | Status: AC
  Administered 2011-08-05: 15 [IU] via SUBCUTANEOUS

## 2011-08-05 NOTE — Progress Notes (Signed)
Ambulated with rolling walker with Min assist to bathroom. Required extra time and safety cues to stand. Can don/doff clothing on R side. Requires assist on L, due to limited ROM to LUE.  BLE with pitting edema, elevating extremities when not in use.  Remains continent of bowel and bladder. Last bowel movement 08/06/11, large, soft, in toilet. Tylenol 650 mg x 1 given for headache. Pt reports effectiveness.  Steristrips to posterior cervical incision with evident of old, dry, dark blood. Up in chair most of shift.

## 2011-08-05 NOTE — Progress Notes (Signed)
[facility] 402. Marissa Nestle, PA notified. Order received to given 15U of Novolog x 1 dose. No stat lab at this time.

## 2011-08-05 NOTE — Progress Notes (Signed)
Patient ID: Christopher [name], male   DOB: 06-20-1941, 70 y.o.   MRN: [id number] Patient ID: Christopher [name], male   DOB: 1940-10-17, 70 y.o.   MRN: [id number] Subjective/Complaints: Review of Systems  HENT: Positive for neck pain.   Musculoskeletal: Positive for joint pain.  Neurological: Positive for tingling.  All other systems reviewed and are negative.  Denies dizziness.   Objective: Vital Signs: Blood pressure 136/83, pulse 101, temperature 97.9 F (36.6 C), temperature source Oral, resp. rate 19, height 5\' 10"  (1.778 m), weight 93 kg (205 lb 0.4 oz), SpO2 92.00%. No results found. No results found for this basename: WBC:2,HGB:2,HCT:2,PLT:2 in the last 72 hours  Basename 08/04/11 0645 08/03/11 0720  NA 137 136  K 4.0 4.1  CL 100 97  CO2 29 30  GLUCOSE 195* 260*  BUN 26* 34*  CREATININE 1.00 1.11  CALCIUM 9.1 9.2   CBG (last 3)   Basename 08/04/11 2035 08/04/11 1617 08/04/11 1158  GLUCAP 262* 259* 220*    Wt Readings from Last 3 Encounters:  08/01/11 93 kg (205 lb 0.4 oz)  07/25/11 94.1 kg (207 lb 7.3 oz)  07/25/11 94.1 kg (207 lb 7.3 oz)    Physical Exam:  General appearance: alert, cooperative and no distress Head: Normocephalic, without obvious abnormality, atraumatic Eyes: conjunctivae/corneas clear. PERRL, EOM's intact. Fundi benign. Ears: normal TM's and external ear canals both ears Nose: Nares normal. Septum midline. Mucosa normal. No drainage or sinus tenderness. Throat: lips, mucosa, and tongue normal; teeth and gums normal Neck: no adenopathy, no carotid bruit, no JVD, supple, symmetrical, trachea midline and thyroid not enlarged, symmetric, no tenderness/mass/nodules Back: symmetric, no curvature. ROM normal. No CVA tenderness. Resp: clear to auscultation bilaterally Cardio: regular rate and rhythm, S1, S2 normal, no murmur, click, rub or gallop GI: soft, non-tender; bowel sounds normal; no masses,  no organomegaly Extremities: RLE 2+ edema.  LLE 1+ edema.   skin intact Pulses: 2+ and symmetric Skin: Skin color, texture, turgor normal. No rashes or lesions Neurologic: Deltoids 2+/5 Biceps 3+/5,Triceps 3/5.  Wrist and hands 3+ to 4- / 5.  Lower extrs' 3-4/5.  Sensation 1/2 in hands and lower exts.  DTR's 1+.  Cognitively intact. Incision/Wound: clean and intact   Assessment/Plan: 1. Functional deficits secondary to cervical stenosis with myelopathy which require 3+ hours per day of interdisciplinary therapy in a comprehensive inpatient rehab setting. Physiatrist is providing close team supervision and 24 hour management of active medical problems listed below. Physiatrist and rehab team continue to assess barriers to discharge/monitor patient progress toward functional and medical goals. Mobility: Bed Mobility Bed Mobility: Yes Rolling Right: 5: Supervision;With rail Rolling Left: 5: Supervision;With rail Right Sidelying to Sit: 4: Min assist Right Sidelying to Sit Details (indicate cue type and reason): pt. scooted to edge of bed with minimal assist Supine to Sit: 5: Supervision (to R) Transfers Transfers: Yes Sit to Stand: 4: Min assist;With upper extremity assist (cues to get feet under ) Sit to Stand Details (indicate cue type and reason): unable Stand to Sit: 5: Supervision;With upper extremity assist Stand to Sit Details: intermittent uncontroleed descent Stand Pivot Transfers: 4: Min Designer, television/film set Transfers: 5: Supervision;With armrests;Without upper extremity assistance Squat Pivot Transfer Details (indicate cue type and reason): bed to w/c to L Lateral/Scoot Transfers: 1: +2 Total assist Lateral/Scoot Transfer Details (indicate cue type and reason): +2 for transfers to wc (pt=50%) Ambulation/Gait Ambulation/Gait Assistance: 4: Min assist Ambulation/Gait Assistance Details (indicate cue type and reason): cues  to look up, decreased push off and lands on outer L foot, controlled environment Ambulation Distance (Feet): 5  Feet Assistive device: Rolling walker Gait Pattern: Step-through pattern;Decreased step length - right;Decreased step length - left;Right flexed knee in stance;Left flexed knee in stance Stairs: Yes Stairs Assistance: 3: Mod assist Stairs Assistance Details (indicate cue type and reason): first toe taps, then step ups, progressed to up/down 4 steps x 2 mod A d/t 2 uncontrolled descent on 6 in step, otherwise min A Stair Management Technique: Two rails;Forwards Corporate treasurer: Yes Wheelchair Assistance: 4: Systems analyst: Both upper extremities;Both lower extermities Wheelchair Parts Management: Needs assistance Distance: 150 ADL:   Cognition: Cognition Overall Cognitive Status: Appears within functional limits for tasks assessed Arousal/Alertness: Awake/alert Orientation Level: Oriented X4 Attention: Selective Selective Attention: Appears intact Memory: Appears intact Awareness: Appears intact Problem Solving: Appears intact Safety/Judgment: Appears intact Cognition Arousal/Alertness: Awake/alert Orientation Level: Oriented X4   1. Cervical stenosis with myelopathy. Status post cervical 3-4 laminectomy with decompression of spinal and bilateral foraminotomies December 14. No plan for cervical collar.  Encouraged good posture.  2.. DVT Prophylaxis/Anticoagulation: Dopplers lower extremity negative. Continuous Lovenox as directed. Monitor platelet counts any signs of bleeding.  3.. Pain Management: Oxycodone as needed as well as scheduled Elavil at bedtime. Monitor with increased activity.  Seems to be doing fairly well at present.  4. Diabetes mellitus. Hemoglobin A1c of 7.4. Continue Glucotrol 10 mg daily . Glucophage  resumed. Check blood sugars a.c. and at bedtime. Provide full diabetic teaching. Sugars still elevated.  Restrict diet a bit further. Consider further titration of lantus.   5 Chronic renal insufficiency with creatinine  1.5. Monitor hydration and followup labs. Low-dose Lasix resume the 20 mg daily.   6. Hypertension. Toprol 100 mg daily. Monitor with increased activity. Quinapril recently discontinued due to elevated creatinine.  -recheck labs Wednesday    7. Acute bronchitis. Zithromax x5 days. Continue Mucinex and incentive spirometry.  Doing well here without cough or respiratory distress.  8. Lower extremity edema: increase lasix to 40qd. KHT  -elevate legs.  -check labs Wednesday.       LOS (Days) 4 A FACE TO FACE EVALUATION WAS PERFORMED  Danniela Mcbrearty T 08/05/2011, 6:05 AM

## 2011-08-06 LAB — GLUCOSE, CAPILLARY
Glucose-Capillary: 165 mg/dL — ABNORMAL HIGH (ref 70–99)
Glucose-Capillary: 182 mg/dL — ABNORMAL HIGH (ref 70–99)

## 2011-08-06 LAB — BASIC METABOLIC PANEL
BUN: 31 mg/dL — ABNORMAL HIGH (ref 6–23)
Calcium: 9.4 mg/dL (ref 8.4–10.5)
Creatinine, Ser: 1.12 mg/dL (ref 0.50–1.35)
GFR calc Af Amer: 76 mL/min — ABNORMAL LOW (ref >90)

## 2011-08-06 MED ORDER — DICLOFENAC SODIUM 1 % TD GEL
1.0000 "application " | Freq: Three times a day (TID) | TRANSDERMAL | Status: DC
  Administered 2011-08-06 – 2011-08-08 (×8): 1 via TOPICAL
  Filled 2011-08-06: qty 100

## 2011-08-06 NOTE — Progress Notes (Signed)
Patient is alert and oriented x3. Patient given pain meds x1 this shift which provided some relief. Patient was also started on Voltaren gel this shift which has provided some relief also. Patient's blood glucose level has required sliding scale insulin this shift. See MAR. Continue current plan of care.

## 2011-08-06 NOTE — Progress Notes (Signed)
Patient ID: BRIXTON FRANKO, male   DOB: [birth year], 70 y.o.   MRN: [id number] Patient ID: JAYSHUN GALENTINE, male   DOB: 02/09/1941, 70 y.o.   MRN: [id number] Patient ID: JESSIAH STEINHART, male   DOB: 09-12-1940, 70 y.o.   MRN: [id number] Subjective/Complaints: Review of Systems  HENT: Positive for neck pain.   Musculoskeletal: Positive for joint pain.  Neurological: Positive for tingling.  All other systems reviewed and are negative.  right knee pain Objective: Vital Signs: Blood pressure 134/83, pulse 113, temperature 97.5 F (36.4 C), temperature source Axillary, resp. rate 20, height 5\' 10"  (1.778 m), weight 93 kg (205 lb 0.4 oz), SpO2 98.00%. No results found. No results found for this basename: WBC:2,HGB:2,HCT:2,PLT:2 in the last 72 hours  Basename 08/06/11 0600 08/05/11 0605  NA 139 137  K 4.1 4.3  CL 101 98  CO2 29 28  GLUCOSE 164* 191*  BUN 31* 30*  CREATININE 1.12 1.02  CALCIUM 9.4 9.5   CBG (last 3)   Basename 08/05/11 2116 08/05/11 1647 08/05/11 1145  GLUCAP 153* 402* 197*    Wt Readings from Last 3 Encounters:  08/01/11 93 kg (205 lb 0.4 oz)  07/25/11 94.1 kg (207 lb 7.3 oz)  07/25/11 94.1 kg (207 lb 7.3 oz)    Physical Exam:  General appearance: alert, cooperative and no distress Head: Normocephalic, without obvious abnormality, atraumatic Eyes: conjunctivae/corneas clear. PERRL, EOM's intact. Fundi benign. Ears: normal TM's and external ear canals both ears Nose: Nares normal. Septum midline. Mucosa normal. No drainage or sinus tenderness. Throat: lips, mucosa, and tongue normal; teeth and gums normal Neck: no adenopathy, no carotid bruit, no JVD, supple, symmetrical, trachea midline and thyroid not enlarged, symmetric, no tenderness/mass/nodules Back: symmetric, no curvature. ROM normal. No CVA tenderness. Resp: clear to auscultation bilaterally Cardio: regular rate and rhythm, S1, S2 normal, no murmur, click, rub or gallop GI: soft, non-tender; bowel sounds  normal; no masses,  no organomegaly Extremities: RLE 2+ edema.  LLE 1+ edema.  skin intact Pulses: 2+ and symmetric Skin: Skin color, texture, turgor normal. No rashes or lesions Neurologic: Deltoids 2+to 3/5 Biceps 3+/5,Triceps 3/5.  Wrist and hands 3+ to 4- / 5.  Lower extrs' 4/5. Sensation 1/2 in hands and lower exts.  DTR's 1+.  Cognitively intact. Incision/Wound: clean and intact   Assessment/Plan: 1. Functional deficits secondary to cervical stenosis with myelopathy which require 3+ hours per day of interdisciplinary therapy in a comprehensive inpatient rehab setting. Physiatrist is providing close team supervision and 24 hour management of active medical problems listed below. Physiatrist and rehab team continue to assess barriers to discharge/monitor patient progress toward functional and medical goals. Mobility: Bed Mobility Bed Mobility: Yes Rolling Right: 5: Supervision;With rail Rolling Left: 5: Supervision;With rail Right Sidelying to Sit: 4: Min assist Right Sidelying to Sit Details (indicate cue type and reason): pt. scooted to edge of bed with minimal assist Supine to Sit: 5: Supervision (to R) Transfers Transfers: Yes Sit to Stand: 4: Min assist;With upper extremity assist (cues to get feet under ) Sit to Stand Details (indicate cue type and reason): unable Stand to Sit: 5: Supervision;With upper extremity assist Stand to Sit Details: intermittent uncontroleed descent Stand Pivot Transfers: 4: Min Designer, television/film set Transfers: 5: Supervision;With armrests;Without upper extremity assistance Squat Pivot Transfer Details (indicate cue type and reason): bed to w/c to L Lateral/Scoot Transfers: 1: +2 Total assist Lateral/Scoot Transfer Details (indicate cue type and reason): +2 for transfers to wc (pt=50%)  Ambulation/Gait Ambulation/Gait Assistance: 4: Min assist Ambulation/Gait Assistance Details (indicate cue type and reason): cues to look up, decreased push off and  lands on outer L foot, controlled environment Ambulation Distance (Feet): 5 Feet Assistive device: Rolling walker Gait Pattern: Step-through pattern;Decreased step length - right;Decreased step length - left;Right flexed knee in stance;Left flexed knee in stance Stairs: Yes Stairs Assistance: 3: Mod assist Stairs Assistance Details (indicate cue type and reason): first toe taps, then step ups, progressed to up/down 4 steps x 2 mod A d/t 2 uncontrolled descent on 6 in step, otherwise min A Stair Management Technique: Two rails;Forwards Corporate treasurer: Yes Wheelchair Assistance: 4: Systems analyst: Both upper extremities;Both lower extermities Wheelchair Parts Management: Needs assistance Distance: 150 ADL:   Cognition: Cognition Overall Cognitive Status: Appears within functional limits for tasks assessed Arousal/Alertness: Awake/alert Orientation Level: Oriented X4 Attention: Selective Selective Attention: Appears intact Memory: Appears intact Awareness: Appears intact Problem Solving: Appears intact Safety/Judgment: Appears intact Cognition Arousal/Alertness: Awake/alert Orientation Level: Oriented X4   1. Cervical stenosis with myelopathy. Status post cervical 3-4 laminectomy with decompression of spinal and bilateral foraminotomies December 14. No plan for cervical collar.  Encouraged good posture.  2.. DVT Prophylaxis/Anticoagulation: Dopplers lower extremity negative. Continuous Lovenox as directed. Monitor platelet counts any signs of bleeding.  3.. Pain Management: Oxycodone as needed as well as scheduled Elavil at bedtime. Monitor with increased activity.  Seems to be doing fairly well at present.  Will add voltaren for right knee pain.  4. Diabetes mellitus. Hemoglobin A1c of 7.4. Continue Glucotrol 10 mg daily . Glucophage. Check blood sugars a.c. and at bedtime. Provide full diabetic teaching. Sugars still elevated.  Restrict  diet a bit further. Consider further titration of lantus.  Ate pecan pie and chocolate yesterday thus yielding high pm number.   5 Chronic renal insufficiency with creatinine 1.5. Monitor hydration and followup labs. Low-dose Lasix resumed at 20 mg daily. BUN/CR stable yesterday.   6. Hypertension. Toprol 100 mg daily. Monitor with increased activity. Quinapril recently discontinued due to elevated creatinine.  -recheck labs Wednesday    7. Acute bronchitis. Zithromax x5 days. Continue Mucinex and incentive spirometry.  Doing well here without cough or respiratory distress.  No changes at present.  8. Lower extremity edema: increase lasix to 40qd. KHT  -elevate legs.  -following labs closely.       LOS (Days) 5 A FACE TO FACE EVALUATION WAS PERFORMED  Ragnar Waas T 08/06/2011, 7:23 AM

## 2011-08-06 NOTE — Progress Notes (Signed)
Physical Therapy Session Note  Patient Details  Name: Christopher [name] MRN: [id number] Date of Birth: 31-Aug-1940  Today's Date: 08/06/2011 Time: 4098-1191 Time Calculation (min): 27 min  Precautions: Precautions Precautions: Fall Precaution Comments: restrict excessive cervical ROM per MD Required Braces or Orthoses: No Restrictions Weight Bearing Restrictions: No  Short Term Goals: PT Short Term Goal 1: Patient will be able to perform bed mobility at Supervised assist level- met PT Short Term Goal 2: Patient will be able to perform all transfers at min-assist level.- met PT Short Term Goal 3: Patient will perform w/c mobility with Supervised assist level x 150'- met PT Short Term Goal 4: Patient will be able to ambulate using least restrictive device x 30' with min-Assist in home environment- met  Skilled Therapeutic Interventions/Progress Updates:     General Chart Reviewed: Yes Family/Caregiver Present: No   Pain Pain Assessment Pain Assessment: 0-10 Pain Score:   3 Pain Type: Acute pain Pain Location: Knee Pain Orientation: Right Pain Descriptors: Aching Pain Onset: Gradual Patients Stated Pain Goal: 0 Pain Intervention(s): Elevated extremity;Cold applied;Other (Comment) (rest breaks prn) Multiple Pain Sites: No  Other Treatments  Dynamic gait training with W/C handles to increase balance challenge close supervision, focus on increased bilat hip extension, increased BOS, and balance reactions for increased functional independence 125 feet x 2. Up and down 10 steps with bilat rails close supervision for increased functional strength and home access, verbal cues for sequencing and LE position on each step to increase safety and decrease risk of fall. Patient noted right knee ache following stair negotiation. Ice applied and LEs elevated for pain relief at end of session.  Therapy/Group: Individual Therapy  [name] 08/06/2011, 3:32 PM

## 2011-08-06 NOTE — Progress Notes (Signed)
Alert, calls for needs, weakness x 4 with weakness more on rt than lft side min/mod assist transfers, reporting pain rt neck with burning sensation to ext.  On Elavil for neuropathy, bil LE edema, new orders for extra dose of lasix starting today.  Occ np cough using mucinex for congestion takes pills whole with water, CMM diet, feeds self CBGs controlled with SSI and Lantus using Tylenol/Robaxin alt with Percocet for pain control.  Pain steady at level 5 even with medications. No there change in assessment, cont plan of care. Pamelia Hoit

## 2011-08-06 NOTE — Progress Notes (Signed)
Physical Therapy Session Note  Patient Details  Name: Christopher [name] MRN: [id number] Date of Birth: Nov 25, 1940  Today's Date: 08/06/2011 Time: 0735-0803 Time Calculation (min): 28 min  Precautions: Precautions Precautions: Fall Precaution Comments: restrict excessive cervical ROM per MD Required Braces or Orthoses: No Restrictions Weight Bearing Restrictions: No  Short Term Goals: PT Short Term Goal 1: Patient will be able to perform bed mobility at Supervised assist level- progressing toward goal PT Short Term Goal 2: Patient will be able to perform all transfers at min-assist level- met PT Short Term Goal 3: Patient will perform w/c mobility with Supervised assist level x 150'- met PT Short Term Goal 4: Patient will be able to ambulate using least restrictive device x 30' with min-Assist in home environment- met  Skilled Therapeutic Interventions/Progress Updates:     General Chart Reviewed: Yes Family/Caregiver Present: No  Pain Pain Assessment Pain Assessment: 0-10 Pain Score:   4 Pain Type: Surgical pain Pain Location: Neck Pain Orientation: Posterior Pain Descriptors: Aching Pain Onset: Gradual Patients Stated Pain Goal: 0 Pain Intervention(s): RN made aware;Emotional support Multiple Pain Sites: No  Other Treatments  Lateral scoot transfer training left and right x 2 trials each, focus on LE position, sequencing, and W/C parts management supervision with min verbal cues for increased functional independence and safety. Sit-stand 2 x 3 trials, focus on anterior weight shift and LE position for increased functional strength and independence. Progressed from min assist to supervision. Dynamic gait training with RW in controlled environment 45 feet x 2 min assist, verbal cues for increased BOS and upright posture for increased stability and decreased risk of fall. W/C mobility to and from therapy gym 125 feet x 2 supervision for activity tolerance and increased UE  strength.  Therapy/Group: Individual Therapy  [name] 08/06/2011, 9:05 AM

## 2011-08-06 NOTE — Progress Notes (Signed)
Occupational Therapy Session Note  Patient Details  Name: Christopher [name] MRN: [id number] Date of Birth: [birth year]  Today's Date: 08/06/2011 Time: 0931-1027 Time Calculation (min): 56 min  Precautions: Precautions Precautions: Fall Precaution Comments: restrict excessive cervical ROM per MD Required Braces or Orthoses: No Restrictions Weight Bearing Restrictions: No  Short Term Goals: OT Short Term Goal 1: Pt. will perform bathing at mod assist level OT Short Term Goal 2: Pt. will perform dressing at moderate assist level OT Short Term Goal 3: Pt. will perform scoot transfer at mod assist level OT Short Term Goal 4: Pt.  will perform lateral leans for pericare with min assist level  Skilled Therapeutic Interventions/Progress Updates:    Pt seen for ADL retraining at shower level with focus on BUE use with functional task of bathing and dressing.  Pt ambulated with close supervision to walk-in shower with RW.  Pt demonstrated improved ability to complete sit to stand with supervision only.  Pt completed UB and LB dressing with setup, except assist to don teds.  Pt completed brushing teeth in standing with occasional steady assist, hair brushing completed sitting in chair at sink to use sink to support UE.  Pain Pain Assessment Pain Assessment: 0-10 Pain Score:   4 Pain Type: Surgical pain Pain Location: Neck Pain Orientation: Posterior Pain Descriptors: Aching Pain Onset: Gradual Patients Stated Pain Goal: 0 Pain Intervention(s): RN made aware;Emotional support Multiple Pain Sites: No ADL   See FIM for details  Therapy/Group: Individual Therapy  Leonette Monarch 08/06/2011, 10:44 AM

## 2011-08-06 NOTE — Progress Notes (Signed)
Physical Therapy Session Note  Patient Details  Name: Christopher [name] MRN: [id number] Date of Birth: 01-10-1941  Today's Date: 08/06/2011 Time: 1130-1200 Time Calculation (min): 30 min  Precautions: Precautions Precautions: Fall Precaution Comments: restrict excessive cervical ROM per MD Required Braces or Orthoses: No Restrictions Weight Bearing Restrictions: No  Short Term Goals: PT Short Term Goal 1: Patient will be able to perform bed mobility at Supervised assist level- progressing toward goal PT Short Term Goal 2: Patient will be able to perform all transfers at min-assist level.- met PT Short Term Goal 3: Patient will perform w/c mobility with Supervised assist level x 150'- met PT Short Term Goal 4: Patient will be able to ambulate using least restrictive device x 30' with min-Assist in home environment- met  Skilled Therapeutic Interventions/Progress Updates:     General Chart Reviewed: Yes Family/Caregiver Present: No   Pain Pain Assessment Pain Assessment: No/denies pain Pain Score: 0-No pain  Other Treatments  W/C mobility with bilat LEs for increased functional strength x 100 feet. Kinetron on 30 cm/sec 3 x 2 min cycles for increased functional strength. Gait with no AD min assist 25 feet x 2, focus on increased bilat hip extension and increased glute contraction in stance to increase stability and strength and decrease risk of fall.  Therapy/Group: Individual Therapy  [name] 08/06/2011, 12:22 PM

## 2011-08-06 NOTE — Progress Notes (Signed)
Physical Therapy Note  Patient Details  Name: ABID BOLLA MRN: [id number] Date of Birth: September 05, 1940 Today's Date: 08/06/2011  Individual therapy [id number] Complaint of neck pain, just received pain medication. W/c mobility on unit supervision x 120' x2. SteadyA for squat pivot from w/c to mat and close S for sit to stands, cues for hand placement. Gait with RW for general lower extremity strengthening with steadyA/close supervision x 50' cues for posture. Reviewed positioning and use of elevating legrests for lower extremity edema - pt verbalized understanding. Nustep on level 3 x 7 minutes for activity tolerance and general lower extremity circulation. Dynamic gait training with RW for negotiating turns, steadyA intermittently during turning to left, stepping over objects with RW and cues for safety. SteadyA for toileting (gait into bathroom with RW) and dynamic standing balance at sink to wash hands. Pt tolerated treatment well. Set up in recliner with lower extremities elevated at end of session.  Karolee Stamps [facility] 08/06/2011, 8:46 AM

## 2011-08-07 LAB — BASIC METABOLIC PANEL
BUN: 30 mg/dL — ABNORMAL HIGH (ref 6–23)
Chloride: 98 mEq/L (ref 96–112)
Creatinine, Ser: 1.16 mg/dL (ref 0.50–1.35)
GFR calc Af Amer: 73 mL/min — ABNORMAL LOW (ref >90)
GFR calc non Af Amer: 63 mL/min — ABNORMAL LOW (ref >90)
Potassium: 4.5 mEq/L (ref 3.5–5.1)

## 2011-08-07 LAB — GLUCOSE, CAPILLARY
Glucose-Capillary: 119 mg/dL — ABNORMAL HIGH (ref 70–99)
Glucose-Capillary: 170 mg/dL — ABNORMAL HIGH (ref 70–99)
Glucose-Capillary: 132 mg/dL — ABNORMAL HIGH (ref 70–99)

## 2011-08-07 MED ORDER — DICLOFENAC SODIUM 1 % TD GEL: 1.0000 "application " | Freq: Three times a day (TID) | TRANSDERMAL | Status: DC

## 2011-08-07 MED ORDER — AMITRIPTYLINE HCL 10 MG PO TABS: 10.0000 mg | ORAL_TABLET | Freq: Every day | ORAL | Status: DC

## 2011-08-07 MED ORDER — FUROSEMIDE 40 MG PO TABS: 40.0000 mg | ORAL_TABLET | Freq: Every day | ORAL | Status: DC

## 2011-08-07 MED ORDER — INSULIN GLARGINE 100 UNIT/ML ~~LOC~~ SOLN: 24.0000 [IU] | Freq: Every day | SUBCUTANEOUS | Status: DC

## 2011-08-07 MED ORDER — METHOCARBAMOL 500 MG PO TABS: 500.0000 mg | ORAL_TABLET | Freq: Four times a day (QID) | ORAL | Status: AC | PRN

## 2011-08-07 MED ORDER — POTASSIUM CHLORIDE CRYS ER 20 MEQ PO TBCR: 20.0000 meq | EXTENDED_RELEASE_TABLET | Freq: Every day | ORAL | Status: DC

## 2011-08-07 MED ORDER — OXYCODONE-ACETAMINOPHEN 5-325 MG PO TABS: 1.0000 | ORAL_TABLET | Freq: Four times a day (QID) | ORAL | Status: AC | PRN

## 2011-08-07 NOTE — Progress Notes (Signed)
Patient is alert and oriented x3. He complain of pain and was given pain meds x1 this shift which provided relief. Patient's blood glucose has required sliding scale insulin this shift. Continue current plan of care.

## 2011-08-07 NOTE — Progress Notes (Signed)
Physical Therapy Note  Patient Details  Name: Christopher [name] MRN: [id number] Date of Birth: 09-05-1940 Today's Date: 08/07/2011  Individual therapy 1030-1100 (30 minutes). Denies pain. Overall supervision for toileting and dynamic balance at the sink. Gait training with RW overall superivision x 120' and then x 155', cues for posture and occasionally to stay close to RW during turns or when carrying on conversation. Practiced up/down curb step with Rw for home entry x 2 reps, overall close superivison initally and cueing for technique.    Karolee Stamps [facility] 08/07/2011, 12:29 PM

## 2011-08-07 NOTE — Progress Notes (Signed)
Physical Therapy Session Note  Patient Details  Name: Christopher [name] MRN: [id number] Date of Birth: 08-23-1940  Today's Date: 08/07/2011 Time: 1105-1200 Time Calculation (min): 55 min  Precautions: Precautions Precautions: Fall Precaution Comments: restrict excessive cervical ROM per MD Required Braces or Orthoses: No Restrictions Weight Bearing Restrictions: No  Short Term Goals: PT Short Term Goal 1: Patient will be able to perform bed mobility at Supervised assist level PT Short Term Goal 2: Patient will be able to perform all transfers at min-assist level. PT Short Term Goal 3: Patient will perform w/c mobility with Supervised assist level x 150' PT Short Term Goal 4: Patient will be able to ambulate using least restrictive device x 30' with min-Assist in home environment  Skilled Therapeutic Interventions/Progress Updates:     General Chart Reviewed: Yes Family/Caregiver Present: No   Pain Pain Assessment Pain Assessment: 0-10 Pain Score:   4 Pain Type: Acute pain Pain Location: Neck Pain Orientation: Posterior Pain Descriptors: Aching Pain Onset: On-going Patients Stated Pain Goal: 0 Pain Intervention(s): Other (Comment) (rest breaks prn, patient pre-medicated) Multiple Pain Sites: No  Other Treatments  Car transfer training to sedan height and large truck height with running board with RW supervision, focus on sequencing and RW position to increase safety and decrease risk of fall. Standing alternating toe taps on 6 inch step with no AD and intermittent 5 second holds in modified trendelenburg position, close supervision to min assist, focus on hip and ankle strategies, reaction time to LOB, and hip strength and stability. Sidestepping left and right with no AD min assist, focus on graded lateral weight shift and increased bilat hip extension and glute contraction in stance to increase stability, strength, and hip and ankle strategies. Gait back to room at end of  session with RW supervision, much improved posture and graded lateral weight shift noted.  Therapy/Group: Individual Therapy  [name] 08/07/2011, 12:12 PM

## 2011-08-07 NOTE — Progress Notes (Signed)
Occupational Therapy Session Note  Patient Details  Name: SHERMAN DONALDSON MRN: [id number] Date of Birth: Sep 17, 1940  Today's Date: 08/07/2011 Time: 0800-0855 Time Calculation (min): 55 min  Precautions: Precautions Precautions: Fall Precaution Comments: restrict excessive cervical ROM per MD Required Braces or Orthoses: No Restrictions Weight Bearing Restrictions: No   Skilled Therapeutic Interventions/Progress Updates:    ADL retraining including bathing at walk-in shower level and dressing from EOB with sit to stand.  Pt amb to bathroom with r/w for toilet transfer and shower transfer.  Pt completed all tasks at supervision level with min verbal cues for safety awareness for sit to stands.  Focus on activity tolerance, safety awareness, and dynamic standing balance with BADLs.   Pain Pain Assessment Pain Assessment: 0-10 Pain Score:   4 Pain Type: Acute pain Pain Location: Foot Pain Orientation: Right;Left Pain Descriptors: Aching Pain Onset: On-going Patients Stated Pain Goal: 0 Pain Intervention(s): RN made aware  Therapy/Group: Individual Therapy  Rich Brave 08/07/2011, 9:23 AM

## 2011-08-07 NOTE — Discharge Summary (Signed)
Physician Discharge Summary  Patient ID: NATIVIDAD HALLS MRN: [id number] DOB/AGE: May 10, 1941 70 y.o.  Admit date: 07/25/2011 Discharge date: 08/07/2011  Admission Diagnoses:cervical stenosis. Myelopathy. Radiculopathy.dm.  Discharge Diagnoses: same Active Problems:  * No active hospital problems. *    Discharged Condition:stable  Hospital Course: posterior cervical fusion with lateral masses screws.  Consults:hospitalist  Significant Diagnostic Studies: myelogram  Treatments:surgery  Discharge Exam: Blood pressure 154/83, pulse 86, temperature 97.4 F (36.3 C), temperature source Oral, resp. rate 18, height 5\' 10"  (1.778 m), weight 94.1 kg (207 lb 7.3 oz), SpO2 92.00%. neck anterior and posterior incisions. decrease of flexibilitry Edema of lower extremities. Diabetic neuropathy.ataxic.   Disposition: Rehab Facility  Discharge Orders    Future Appointments: Provider: Department: Dept Phone: Center:   09/05/2011 9:20 AM Ranelle Oyster, MD [facility] [phone] CPR     Medication List  As of 08/07/2011  8:05 PM   ASK your doctor about these medications         amLODipine 10 MG tablet   Commonly known as: NORVASC      aspirin EC 81 MG tablet      Fish Oil 1200 MG Caps      glipiZIDE 10 MG tablet   Commonly known as: GLUCOTROL      metFORMIN 1000 MG tablet   Commonly known as: GLUCOPHAGE      metoprolol succinate 25 MG 24 hr tablet   Commonly known as: TOPROL-XL      multivitamin tablet      quinapril 40 MG tablet   Commonly known as: ACCUPRIL             Signed: Cheryle Dark M 08/07/2011, 8:05 PM

## 2011-08-07 NOTE — Progress Notes (Signed)
Occupational Therapy Session Note  Patient Details  Name: Christopher [name] MRN: [id number] Date of Birth: 1940-11-01  Today's Date: 08/07/2011 Time: 9629-5284 Time Calculation (min): 40 min  Precautions: Precautions Precautions: Fall Precaution Comments: restrict excessive cervical ROM per MD Required Braces or Orthoses: No Restrictions Weight Bearing Restrictions: No  Short Term Goals: OT Short Term Goal 1: Pt. will perform bathing at mod assist level OT Short Term Goal 2: Pt. will perform dressing at moderate assist level OT Short Term Goal 3: Pt. will perform scoot transfer at mod assist level OT Short Term Goal 4: Pt.  will perform lateral leans for pericare with min assist level  Skilled Therapeutic Interventions/Progress Updates:    Engaged in functional mobility with w/c and RW.  Focus on maneuvering through gift shop with RW with focus on maintaining balance, energy conservation, and selective attention with walking and carrying on a conversation.  Pt demonstrated appropriate safety awareness and was able to verbalize need to rest, did require min verbal cuing to be aware of hazards on the floor and reminded to take his time turning corners.  Pt demonstrated improved ability to remain supervision with ambulation while carrying on conversation with his granddaughter.  Pain No c/o pain this session. Therapy/Group: Individual Therapy  Leonette Monarch 08/07/2011, 2:24 PM

## 2011-08-07 NOTE — Patient Care Conference (Signed)
Inpatient RehabilitationTeam Conference Note Date: 08/07/2011   Time: 6:23 PM    Patient Name: Christopher [name]      Medical Record Number: [id number]  Date of Birth: 1941/05/05 Sex: Male         Room/Bed: 4003/4003-01 Payor Info: Payor: Advertising copywriter MEDICARE  Plan: [employer] MEDICARE COMPLETE  Product Type: *No Product type*     Admitting Diagnosis: cervical myelopathy  Admit Date/Time:  08/01/2011  3:40 PM Admission Comments: No comment available   Primary Diagnosis:  Myelopathy Principal Problem: Myelopathy  Patient Active Problem List  Diagnoses Date Noted  . Myelopathy 08/01/2011    Expected Discharge Date: Expected Discharge Date: 08/09/11  Team Members Present: Physician: Dr. Faith Rogue Case Manager Present: Melanee Spry, RN Social Worker Present: Amada Jupiter, LCSW PT Present: Karolee Stamps, PT OT Present: Mackie Pai, OT;Ardis Rowan, COTA RN Present: Rosebud Poles    Current Status/Progress Goal Weekly Team Focus  Medical   cervical myelopathy, more of a central cord picture, DM2, chronic renal insufficiency.  improved motor control upper and lower ext.    pain control, right knee especially   Bowel/Bladder   patient is contient of bowel and bladder  patient will need minimal assist  staff will assist patient ot bathroom as needed   Swallow/Nutrition/ Hydration             ADL's   supervision overall, close supervision shower transfers  supervision overall  safety awareness   Mobility   close supervision/steadyA  supervision overall  gait training, dynamic standing balance,   Communication             Safety/Cognition/ Behavioral Observations            Pain   patient complains of pain in his feet, right knee, and neck  patient's pain goal is <2.  staff will provide pain medication and monitor effectiveness   Skin   patient has no skin breakdown  patient will have no skin breakdown this admission  staff will moniotr patient's skin daily      *See  Interdisciplinary Assessment and Plan and progress notes for long and short-term goals  Barriers to Discharge: none identified    Possible Resolutions to Barriers:       Discharge Planning/Teaching Needs:  home with wife available to provide assistance      Team Discussion: Need wife in for family ed.  Recommend OP f/up if transportation is available.   Revisions to Treatment Plan: none    Continued Need for Acute Rehabilitation Level of Care: The patient requires daily medical management by a physician with specialized training in physical medicine and rehabilitation for the following conditions: Daily direction of a multidisciplinary physical rehabilitation program to ensure safe treatment while eliciting the highest outcome that is of practical value to the patient.: Yes Daily medical management of patient stability for increased activity during participation in an intensive rehabilitation regime.: Yes Daily analysis of laboratory values and/or radiology reports with any subsequent need for medication adjustment of medical intervention for : Post surgical problems;Neurological problems  Brock Ra 08/07/2011, 6:23 PM

## 2011-08-07 NOTE — Progress Notes (Signed)
Subjective/Complaints: Review of Systems  HENT: Positive for neck pain.   Musculoskeletal: Positive for joint pain.  Neurological: Positive for tingling.  All other systems reviewed and are negative.  right knee pain a little better with gel Objective: Vital Signs: Blood pressure 152/99, pulse 78, temperature 98.3 F (36.8 C), temperature source Oral, resp. rate 22, height 5\' 10"  (1.778 m), weight 93 kg (205 lb 0.4 oz), SpO2 94.00%. No results found. No results found for this basename: WBC:2,HGB:2,HCT:2,PLT:2 in the last 72 hours  Basename 08/06/11 0600 08/05/11 0605  NA 139 137  K 4.1 4.3  CL 101 98  CO2 29 28  GLUCOSE 164* 191*  BUN 31* 30*  CREATININE 1.12 1.02  CALCIUM 9.4 9.5   CBG (last 3)   Basename 08/06/11 2041 08/06/11 1620 08/06/11 1201  GLUCAP 182* 165* 168*    Wt Readings from Last 3 Encounters:  08/01/11 93 kg (205 lb 0.4 oz)  07/25/11 94.1 kg (207 lb 7.3 oz)  07/25/11 94.1 kg (207 lb 7.3 oz)    Physical Exam:  General appearance: alert, cooperative and no distress Head: Normocephalic, without obvious abnormality, atraumatic Eyes: conjunctivae/corneas clear. PERRL, EOM's intact. Fundi benign. Ears: normal TM's and external ear canals both ears Nose: Nares normal. Septum midline. Mucosa normal. No drainage or sinus tenderness. Throat: lips, mucosa, and tongue normal; teeth and gums normal Neck: no adenopathy, no carotid bruit, no JVD, supple, symmetrical, trachea midline and thyroid not enlarged, symmetric, no tenderness/mass/nodules Back: symmetric, no curvature. ROM normal. No CVA tenderness. Resp: clear to auscultation bilaterally Cardio: regular rate and rhythm, S1, S2 normal, no murmur, click, rub or gallop GI: soft, non-tender; bowel sounds normal; no masses,  no organomegaly Extremities: edema 1+ rle, trace lle. Pulses: 2+ and symmetric Skin: Skin color, texture, turgor normal. No rashes or lesions Neurologic: Deltoids 2+to 3/5 Biceps  3+/5,Triceps 3+/5.  Wrist and hands 3+ to 4- / 5.  Lower extrs' 4 to 4+/5. Sensation 1/2 in hands and lower exts.  DTR's 1+.  Cognitively intact. Incision/Wound: clean and intact   Assessment/Plan: 1. Functional deficits secondary to cervical stenosis with myelopathy which require 3+ hours per day of interdisciplinary therapy in a comprehensive inpatient rehab setting. Physiatrist is providing close team supervision and 24 hour management of active medical problems listed below. Physiatrist and rehab team continue to assess barriers to discharge/monitor patient progress toward functional and medical goals. Mobility: Bed Mobility Bed Mobility: Yes Rolling Right: 5: Supervision;With rail Rolling Left: 5: Supervision;With rail Right Sidelying to Sit: 4: Min assist Right Sidelying to Sit Details (indicate cue type and reason): pt. scooted to edge of bed with minimal assist Supine to Sit: 5: Supervision (to R) Transfers Transfers: Yes Sit to Stand: 4: Min assist;With upper extremity assist (cues to get feet under ) Sit to Stand Details (indicate cue type and reason): unable Stand to Sit: 5: Supervision;With upper extremity assist Stand to Sit Details: intermittent uncontroleed descent Stand Pivot Transfers: 4: Min Designer, television/film set Transfers: 5: Supervision;With armrests;Without upper extremity assistance Squat Pivot Transfer Details (indicate cue type and reason): bed to w/c to L Lateral/Scoot Transfers: 1: +2 Total assist Lateral/Scoot Transfer Details (indicate cue type and reason): +2 for transfers to wc (pt=50%) Ambulation/Gait Ambulation/Gait Assistance: 4: Min assist Ambulation/Gait Assistance Details (indicate cue type and reason): cues to look up, decreased push off and lands on outer L foot, controlled environment Ambulation Distance (Feet): 5 Feet Assistive device: Rolling walker Gait Pattern: Step-through pattern;Decreased step length - right;Decreased  step length - left;Right  flexed knee in stance;Left flexed knee in stance Stairs: Yes Stairs Assistance: 3: Mod assist Stairs Assistance Details (indicate cue type and reason): first toe taps, then step ups, progressed to up/down 4 steps x 2 mod A d/t 2 uncontrolled descent on 6 in step, otherwise min A Stair Management Technique: Two rails;Forwards Corporate treasurer: Yes Wheelchair Assistance: 4: Systems analyst: Both upper extremities;Both lower extermities Wheelchair Parts Management: Needs assistance Distance: 150 ADL:   Cognition: Cognition Overall Cognitive Status: Appears within functional limits for tasks assessed Arousal/Alertness: Awake/alert Orientation Level: Oriented X4 Attention: Selective Selective Attention: Appears intact Memory: Appears intact Awareness: Appears intact Problem Solving: Appears intact Safety/Judgment: Appears intact Cognition Arousal/Alertness: Awake/alert Orientation Level: Oriented X4   1. Cervical stenosis with myelopathy. Status post cervical 3-4 laminectomy with decompression of spinal and bilateral foraminotomies December 14. No plan for cervical collar.  Encouraged good posture.  2.. DVT Prophylaxis/Anticoagulation: Dopplers lower extremity negative. Continuous Lovenox as directed. Monitor platelet counts any signs of bleeding.   3.. Pain Management: Oxycodone as needed as well as scheduled Elavil at bedtime. Monitor with increased activity.  Seems to be doing fairly well at present.   voltaren for right knee pain hellpful  4. Diabetes mellitus. Hemoglobin A1c of 7.4. Continue Glucotrol 10 mg daily . Glucophage. Check blood sugars a.c. and at bedtime. Provide full diabetic teaching. Sugars showing improvement.   5 Chronic renal insufficiency with creatinine 1.5. Monitor hydration and followup labs. Low-dose Lasix resumed at 20 mg daily. BUN/CR stable yesterday.   6. Hypertension. Toprol 100 mg daily. Monitor with increased  activity. Quinapril recently discontinued due to elevated creatinine.  -recheck labs Wednesday    7. Acute bronchitis. Zithromax x5 days. Continue Mucinex and incentive spirometry.  Doing well here without cough or respiratory distress.  No changes at present.  8. Lower extremity edema: increased lasix to 40qd. Edema decreasing.  - KHT  -elevate legs.  -following labs closely.       LOS (Days) 6 A FACE TO FACE EVALUATION WAS PERFORMED  Jenniah Bhavsar T 08/07/2011, 7:16 AM

## 2011-08-08 DIAGNOSIS — G8110 Spastic hemiplegia affecting unspecified side: Secondary | ICD-10-CM

## 2011-08-08 DIAGNOSIS — I69921 Dysphasia following unspecified cerebrovascular disease: Secondary | ICD-10-CM

## 2011-08-08 DIAGNOSIS — I6330 Cerebral infarction due to thrombosis of unspecified cerebral artery: Secondary | ICD-10-CM

## 2011-08-08 DIAGNOSIS — Z5189 Encounter for other specified aftercare: Secondary | ICD-10-CM

## 2011-08-08 LAB — BASIC METABOLIC PANEL
BUN: 34 mg/dL — ABNORMAL HIGH (ref 6–23)
BUN: 35 mg/dL — ABNORMAL HIGH (ref 6–23)
CO2: 27 mEq/L (ref 19–32)
CO2: 28 mEq/L (ref 19–32)
Calcium: 9.1 mg/dL (ref 8.4–10.5)
Calcium: 9.5 mg/dL (ref 8.4–10.5)
Chloride: 101 mEq/L (ref 96–112)
Chloride: 98 mEq/L (ref 96–112)
Creatinine, Ser: 1.33 mg/dL (ref 0.50–1.35)
Creatinine, Ser: 1.42 mg/dL — ABNORMAL HIGH (ref 0.50–1.35)

## 2011-08-08 LAB — GLUCOSE, CAPILLARY: Glucose-Capillary: 179 mg/dL — ABNORMAL HIGH (ref 70–99)

## 2011-08-08 NOTE — Progress Notes (Signed)
Occupational Therapy Discharge Summary & Session Notes  Patient Details  Name: Christopher [name] MRN: [id number] Date of Birth: 08-21-1940 Today's Date: 08/08/2011  Session Notes  Session #1 [id number] -  Individual Therapy No complaints of pain  Engaged in ADL retraining at shower level. Focused skilled intervention on functional ambulation/mobility using rolling walker, increasing overall activity tolerance/endurance, toileting, toilet transfer, UB/LB bathing in sit to stand position using shower chair prn, UB/LB dressing in sit to stand position, and grooming tasks in standing and seated position prn. Patient at an overall supervision to modified independent level for aspects of daily living. Patient at a supervision level for bathing & dressing and modified independent for self-feeding, toileting, toilet transfers, and grooming tasks.   Session #2 1100-1200 - 60 Minutes Individual Therapy No complaints of pain  Treatment focus on functional ambulation/mobility using rolling walker, simulated walk-in shower transfer using shower seat with back, education & demonstration with return demonstration from patient regarding upper extremity strengthening & stretching exercises. Discussed home IADL tasks with patient and safety regarding IADLs and ADLs.    Discharge Summary  Patient has met 11 of 11 long term goals due to improved activity tolerance, improved balance, postural control, ability to compensate for deficits, functional use of  RIGHT upper and LEFT upper extremity and improved coordination.  No family available for education at discharge. Patient is functioning at an overall Supervision to Modified Independent level for aspects of daily living and all education to patient has been completed.   See FIM and Discharge Navigator for objective and subjective measures.   Recommendation:  Patient will continue to received OT services in an outpatient setting to continue to advance functional  skills in the area of Functional use of UEs, ADLs, overally dynamic balance, and safety.   Equipment: No equipment provided - per patient report, he has necessary items for toilet and shower.  Patient/family agrees with progress made and goals achieved: Yes; patient.  Asuka Dusseau 08/08/2011, 8:55 AM

## 2011-08-08 NOTE — Progress Notes (Signed)
Social Work  Met yesterday with patient and wife to review team conference.  Both aware and agreeable with d/c date of 12/29 @ supervision goals overall.  Wife has been attending therapy.  Both agree with f/u as outpatient.  Will arrange.  Dael Howland

## 2011-08-08 NOTE — Progress Notes (Signed)
Physical Therapy Discharge & Treatment Note Summary  Patient Details  Name: CARLEE TESFAYE MRN: [id number] Date of Birth: 1941/05/26 Today's Date: 08/08/2011  Treatment #1: [id number] (42 minutes) Complaint of "a little pain" in back of neck, premedicated per pt report. Supervision gait x 160', x 120' with RW, cues for safety and posture in controlled and home environment on carpet. Practiced stairs for community mobility and strengthening with 2 rails, close supervision, cues for which foot to lead with due to weakness greater in right lower extremity. Transfers overall supervision and modified independent with bed mobility. Reviewed and gave handout for [facility] for pt to start with at home, verbalized understanding.   Patient has met 10 of 10 long term goals due to improved activity tolerance, improved balance, improved postural control, increased strength, decreased pain, ability to compensate for deficits and improved coordination.  Patient to discharge at an ambulatory level Supervision.  Recommend supervision for safety with mobility in the home.  Recommendation:  Patient will benefit from ongoing skilled PT services in outpatient setting to continue to advance safe functional mobility, address ongoing impairments in gait, balance, strength, activity tolerance, and minimize fall risk.  Equipment: Equipment provided: RW  Patient/family agrees with progress made and goals achieved: Yes  See PM session note for further balance and postural information.  Karolee Stamps [facility] 08/08/2011, 8:12 AM

## 2011-08-08 NOTE — Progress Notes (Addendum)
Physical Therapy Session Note  Patient Details  Name: Christopher [name] MRN: [id number] Date of Birth: [birth year]  Today's Date: 08/08/2011 Time: 1191-4782 Time Calculation (min): 50 min  Precautions: Precautions Precautions: Fall Precaution Comments: restrict excessive cervical ROM per MD Required Braces or Orthoses: No Restrictions Weight Bearing Restrictions: No  Short Term Goals: PT Short Term Goal 1: Patient will be able to perform bed mobility at Supervised assist level PT Short Term Goal 2: Patient will be able to perform all transfers at min-assist level. PT Short Term Goal 3: Patient will perform w/c mobility with Supervised assist level x 150' PT Short Term Goal 4: Patient will be able to ambulate using least restrictive device x 30' with min-Assist in home environment  Skilled Therapeutic Interventions/Progress Updates: Treatment focused on gait training in home situation, standing balance activities, therapeutic exercise. Gait training with RW, x 150' x 2, with supervison; weaving in/out of furniture in home setting, including backwards walking and sidestepping with RW, and squatting to retrieve items from floor, without loss of balance, with supervision.  Transfers to furniture of various heights, from 15"-18" in height, with extra time, without loss of balance.  Standing balance activities per below.       Pain:  Pain Assessment Pain Assessment: 0-10 Pain Score: 2 Pain Location: Neck Pain Orientation: Posterior (base of neck) Pain Descriptors: Aching Pain Onset: Gradual Patients Stated Pain Goal: 3 Pain Intervention(s): pre-medicated (See eMAR); treatment modified to reduce stress on neck; small towel roll placed in cervical lordosis after treatment, sitting in recliner Multiple Pain Sites: No     Trunk/Postural Assessment: Pt tends to walk in forward flexion, with bil shoulders elevated; pt able to extend trunk, and relax shoulders with VCs.     Balance in standing:  Pt stood on compliant surface during R and LUE reaching activities, demonstrating ankle strategy sufficient to maintain balance  8/9 trials; he sat quickly once.  When tossing horseshoes in standing, with VCs and demo, demonstrated fluid reciprocal wt shifting during tossing, without loss of balance 12/14 trials.  Pt regained balance twice with close supervision.  Heel-toe wt shifts with ankle and hip strategies evident, delayed and inadequate 2/20 trials, causing loss of balance and unplanned sitting on mat.  Exercises: pt instructed in self-stretching of heel cords and hamstrings using strap; demonstrated understanding.  In standing on compliant surface, calf raises 2 x 10 with close supervision.        Therapy/Group: Individual Therapy  Annisha Baar 08/08/2011, 2:08 PM

## 2011-08-08 NOTE — Progress Notes (Signed)
Patient ID: Christopher [name], male   DOB: 09/04/1940, 70 y.o.   MRN: [id number] Subjective/Complaints: Review of Systems  HENT: Positive for neck pain.   Musculoskeletal: Positive for joint pain.  Neurological: Positive for tingling.  All other systems reviewed and are negative.  right knee pain a little better with gel Objective: Vital Signs: Blood pressure 133/86, pulse 88, temperature 98.5 F (36.9 C), temperature source Oral, resp. rate 20, height 5\' 10"  (1.778 m), weight 93 kg (205 lb 0.4 oz), SpO2 95.00%. No results found. No results found for this basename: WBC:2,HGB:2,HCT:2,PLT:2 in the last 72 hours  Basename 08/07/11 0930 08/06/11 0600  NA 140 139  K 4.5 4.1  CL 98 101  CO2 31 29  GLUCOSE 210* 164*  BUN 30* 31*  CREATININE 1.16 1.12  CALCIUM 9.4 9.4   CBG (last 3)   Basename 08/07/11 2112 08/07/11 1623 08/07/11 1130  GLUCAP 174* 132* 170*    Wt Readings from Last 3 Encounters:  08/01/11 93 kg (205 lb 0.4 oz)  07/25/11 94.1 kg (207 lb 7.3 oz)  07/25/11 94.1 kg (207 lb 7.3 oz)    Physical Exam:  General appearance: alert, cooperative and no distress Head: Normocephalic, without obvious abnormality, atraumatic Eyes: conjunctivae/corneas clear. PERRL, EOM's intact. Fundi benign. Ears: normal TM's and external ear canals both ears Nose: Nares normal. Septum midline. Mucosa normal. No drainage or sinus tenderness. Throat: lips, mucosa, and tongue normal; teeth and gums normal Neck: no adenopathy, no carotid bruit, no JVD, supple, symmetrical, trachea midline and thyroid not enlarged, symmetric, no tenderness/mass/nodules Back: symmetric, no curvature. ROM normal. No CVA tenderness. Resp: clear to auscultation bilaterally Cardio: regular rate and rhythm, S1, S2 normal, no murmur, click, rub or gallop GI: soft, non-tender; bowel sounds normal; no masses,  no organomegaly Extremities: edema trace in lower exts Pulses: 2+ and symmetric Skin: Skin color, texture, turgor  normal. No rashes or lesions Neurologic: Deltoids 2+to 3/5 Biceps 3+/5,Triceps 3+/5.  Wrist and hands  4- / 5.  Lower extrs'  4+/5. Sensation 1+/2 in hands and lower exts.  DTR's 1+.  Cognitively intact. Incision/Wound: clean and intact   Assessment/Plan: 1. Functional deficits secondary to cervical stenosis with myelopathy which require 3+ hours per day of interdisciplinary therapy in a comprehensive inpatient rehab setting. Physiatrist is providing close team supervision and 24 hour management of active medical problems listed below. Physiatrist and rehab team continue to assess barriers to discharge/monitor patient progress toward functional and medical goals. Mobility: Bed Mobility Bed Mobility: Yes Rolling Right: 5: Supervision;With rail Rolling Left: 5: Supervision;With rail Right Sidelying to Sit: 4: Min assist Right Sidelying to Sit Details (indicate cue type and reason): pt. scooted to edge of bed with minimal assist Supine to Sit: 5: Supervision (to R) Transfers Transfers: Yes Sit to Stand: 4: Min assist;With upper extremity assist (cues to get feet under ) Sit to Stand Details (indicate cue type and reason): unable Stand to Sit: 5: Supervision;With upper extremity assist Stand to Sit Details: intermittent uncontroleed descent Stand Pivot Transfers: 4: Min Designer, television/film set Transfers: 5: Supervision;With armrests;Without upper extremity assistance Squat Pivot Transfer Details (indicate cue type and reason): bed to w/c to L Lateral/Scoot Transfers: 1: +2 Total assist Lateral/Scoot Transfer Details (indicate cue type and reason): +2 for transfers to wc (pt=50%) Ambulation/Gait Ambulation/Gait Assistance: 4: Min assist Ambulation/Gait Assistance Details (indicate cue type and reason): cues to look up, decreased push off and lands on outer L foot, controlled environment Ambulation Distance (Feet):  5 Feet Assistive device: Rolling walker Gait Pattern: Step-through  pattern;Decreased step length - right;Decreased step length - left;Right flexed knee in stance;Left flexed knee in stance Stairs: Yes Stairs Assistance: 3: Mod assist Stairs Assistance Details (indicate cue type and reason): first toe taps, then step ups, progressed to up/down 4 steps x 2 mod A d/t 2 uncontrolled descent on 6 in step, otherwise min A Stair Management Technique: Two rails;Forwards Corporate treasurer: Yes Wheelchair Assistance: 4: Systems analyst: Both upper extremities;Both lower extermities Wheelchair Parts Management: Needs assistance Distance: 150 ADL:   Cognition: Cognition Overall Cognitive Status: Appears within functional limits for tasks assessed Arousal/Alertness: Awake/alert Orientation Level: Oriented X4 Attention: Selective Selective Attention: Appears intact Memory: Appears intact Awareness: Appears intact Problem Solving: Appears intact Safety/Judgment: Appears intact Cognition Arousal/Alertness: Awake/alert Orientation Level: Oriented X4   1. Cervical stenosis with myelopathy. Status post cervical 3-4 laminectomy with decompression of spinal and bilateral foraminotomies December 14. No plan for cervical collar.  Encouraged good posture.  2.. DVT Prophylaxis/Anticoagulation: Dopplers lower extremity negative. Continuous Lovenox as directed. Monitor platelet counts any signs of bleeding.   3.. Pain Management: Oxycodone as needed as well as scheduled Elavil at bedtime. Monitor with increased activity.  Seems to be doing fairly well at present.   voltaren for right knee pain hellpful. Would like a script for home use.  4. Diabetes mellitus. Hemoglobin A1c of 7.4. Continue Glucotrol 10 mg daily . Glucophage. Check blood sugars a.c. and at bedtime. Provide full diabetic teaching. Sugars showing improvement.   5 Chronic renal insufficiency with creatinine 1.5. Monitor hydration and followup labs. Low-dose Lasix  resumed at 20 mg daily. BUN/CR stable so far.  Will recheck today with dc pending for tomorrow.  6. Hypertension. Toprol 100 mg daily. Monitor with increased activity. Quinapril recently discontinued due to elevated creatinine.     7. Acute bronchitis. Zithromax x5 days. Continue Mucinex and incentive spirometry.  Doing well here without cough or respiratory distress.  No changes at present.  8. Lower extremity edema: increased lasix to 40qd. Edema decreasing.  - KHT  -elevate legs.  -following labs closely--re check today.       LOS (Days) 7 A FACE TO FACE EVALUATION WAS PERFORMED  Creedon Danielski T 08/08/2011, 6:59 AM

## 2011-08-08 NOTE — Discharge Summary (Signed)
NAMESARTAJ, HOSKIN                 ACCOUNT NO.:  [id number]  MEDICAL RECORD NO.:  [id number]  LOCATION:  4003                         FACILITY:  [facility]  PHYSICIAN:  Ranelle Oyster, M.D.DATE OF BIRTH:  November 21, 1940  DATE OF ADMISSION:  08/01/2011 DATE OF DISCHARGE:  08/09/2011                              DISCHARGE SUMMARY   DISCHARGE DIAGNOSES: 1. Cervical stenosis with myelopathy. 2. Subcutaneous Lovenox for deep vein thrombosis prophylaxis. 3. Pain management. 4. Diabetes mellitus. 5. Chronic renal insufficiency with creatinine 1.5. 6. Hypertension. 7. Acute bronchitis, resolved.  HISTORY:  A 70 year old right-handed male with history of cervical stenosis and fusion in the past, admitted December 14 with progressive weakness in both upper extremities.  X-rays and imaging showed severe stenosis with radiculopathy at the level of cervical 3-4.  The area between cervical 4 and cervical 7 for prior surgery looked good. Underwent bilateral cervical 3-4 laminectomy with decompression of spinal cord, bilateral foraminotomies on December 14 per Dr. Jeral Fruit.  No plan for cervical collar.  Postoperative pain control.  The patient with some increased lower extremity swelling.  Echocardiogram with ejection fraction 55% and grade 1 diastolic dysfunction.  Bilateral lower extremity Dopplers completed, that were negative.  Ventilation perfusion scan negative for pulmonary emboli.  Maintained on subcutaneous Lovenox for deep vein thrombosis prophylaxis.  Mild elevation in creatinine to 2.0 from baseline of 1.5 with renal ultrasound negative.  Low-dose Lasix decreased to 20 mg daily.  Developed low-grade fever with chest x-ray and urine studies negative.  Followup Internal Medicine felt to be acute bronchitis, placed on Zithromax x5 days.  Gentle intravenous fluids, creatinine improved to 1.28.  The patient required total assist for sit to stand as well as stand pivot transfers.  He was  admitted for comprehensive rehab program.  PAST MEDICAL HISTORY:  See discharge diagnoses.  SOCIAL HISTORY:  Lives with family, assistance is needed in a 1 level home.  FUNCTIONAL HISTORY PRIOR TO ADMISSION:  Independent with activities of daily living.  FUNCTIONAL STATUS UPON ADMISSION TO REHAB SERVICES:  Total assist of 2 for stand to sit, and total assist for stand pivot transfers, ambulation was yet to be tested.  ALLERGIES:  NEURONTIN.  PHYSICAL EXAMINATION:  VITAL SIGNS:  Blood pressure 133/86, pulse 88, temperature 98.5, respirations 20. GENERAL:  This was an alert male, in no acute distress oriented x3. HEENT:  Normocephalic. LUNGS:  Clear to auscultation. CARDIAC:  Regular rate and rhythm. ABDOMEN:  Soft, nontender.  Good bowel sounds.  Surgical site clean and dry. MUSCULOSKELETAL:  Deltoid strength was 2/5 on the left 3/5 on the right. Biceps were 3- to 3+, triceps 3- to 3+ wrist extensors, hip flexors 2+, knee extensors 3+ to 4; ankle dorsiflexors, plantar flexors, 4/5.  REHABILITATION HOSPITAL COURSE:  The patient was admitted to inpatient rehab services with therapies initiated on a 3-hour daily basis consisting of physical therapy, occupational therapy, and rehabilitation nursing.  The following issues were addressed during the patient's rehabilitation stay.  Pertaining to Mr. Stash's cervical stenosis with myelopathy, he had undergone cervical 3-4 laminectomy, decompression. Surgical site healing nicely.  No cervical collar was needed.  He would follow up Dr. Jeral Fruit.  He remained on subcutaneous Lovenox for deep vein thrombosis prophylaxis.  Venous Doppler studies were negative. Oxycodone ongoing for pain management with good results.  He did have a history of diabetes mellitus with hemoglobin A1c of 7.4.  He remained on oral agents with full diabetic teaching completed.  Chronic renal insufficiency with creatinine stable at 1.1 to 1.02 for a baseline of 1.5.   He did remain on low-dose Lasix and monitored.  His blood pressures are well-controlled on Toprol. His quinapril had recently been discontinued due to elevated creatinine.  He completed a 5-day course of Zithromax for acute bronchitis.  He remained afebrile.  No shortness of breath.  The patient received weekly collaborative interdisciplinary team conferences to discuss estimated length of stay, family teaching, and any barriers to discharge.  He required supervision overall for basic activities of daily living as well as functional mobility.  His strength and endurance had greatly improved.  He was still needing some minimal assist for lower body socks and tying his shoes.  He was continent of bowel and bladder.  He received full family teaching and plan was to be discharged to home with ongoing therapies dictated as per [employer].  DISCHARGE MEDICATIONS: 1. Elavil 10 mg at bedtime. 2. Voltaren gel 1% 3 times daily to affected areas. 3. Lasix 40 mg daily. 4. Lantus insulin 24 units at bedtime. 5. Glucophage 1000 mg twice daily. 6. Robaxin 500 mg every 6 hours as needed muscle spasms.  7.  Percocet     1-2 tablets every 6 hours as needed pain, dispense of 60 tablets. 7. Klor-Con 20 mEq daily.  His diet was a diabetic diet.  SPECIAL INSTRUCTIONS:  Follow up with Dr. Jeral Fruit, Neurosurgery, 2 weeks, call for appointment.  He should follow up with his primary MD, 2 weeks for medical management, Dr. Faith Rogue at the outpatient rehab service office as advised.  Home therapies have been arranged as per Rehab Services.     Mariam Dollar, P.A.   ______________________________ Ranelle Oyster, M.D.   DA/MEDQ  D:  08/08/2011  T:  08/08/2011  Job:  [id number]

## 2011-08-08 NOTE — Progress Notes (Signed)
Inpatient Rehabilitation Center Individual Statement of Services  Patient Name:  Christopher [name]  Date:  08/08/2011  Welcome to the Inpatient Rehabilitation Center.  Our goal is to provide you with an individualized program based on your diagnosis and situation, designed to meet your specific needs.  With this comprehensive rehabilitation program, you will be expected to participate in at least 3 hours of rehabilitation therapies Monday-Friday, with modified therapy programming on the weekends.  Your rehabilitation program will include the following services:  Physical Therapy (PT), Occupational Therapy (OT), 24 hour per day rehabilitation nursing, Therapeutic Recreaction (TR), Case Management (RN and Child psychotherapist), Rehabilitation Medicine, Nutrition Services and Pharmacy Services  Weekly team conferences will be held on  Tuesday  to discuss your progress.  Your RN Case Designer, television/film set will talk with you frequently to get your input and to update you on team discussions.  Team conferences with you and your family in attendance may also be held.  Depending on your progress and recovery, your program may change.  Your RN Case Estate agent will coordinate services and will keep you informed of any changes.  Your RN Sports coach and SW names and contact numbers are listed  below.  The following services may also be recommended but are not provided by the Inpatient Rehabilitation Center:   Driving Evaluations  Home Health Rehabiltiation Services  Outpatient Rehabilitatation [facility]  Vocational Rehabilitation   Arrangements will be made to provide these services after discharge if needed.  Arrangements include referral to agencies that provide these services.  Your insurance has been verified to be: [place] Your primary doctor is:  [employer]   Pertinent information will be shared with your doctor and your insurance company.  Case Manager: Melanee Spry,  [facility] [phone]  Social Worker:  [place], [place] [phone]  ELOS: 08/09/11                     Goals: Supervision  Information discussed with pt & wife and copy given to them by: Brock Ra, 08/08/2011, 11:13 AM

## 2011-08-08 NOTE — Progress Notes (Signed)
Patient ID: Christopher [name], male   DOB: 28-Dec-1940, 70 y.o.   MRN: [id number] Note of December 14.. postop

## 2011-08-08 NOTE — Progress Notes (Addendum)
Social Work  Discharge Note  The overall goal for the admission was met for:   Discharge location: Yes - home with wife available to provide 24 hr assist  Length of Stay: Yes - 8 days  Discharge activity level: Yes  Home/community participation: Yes  Services provided included: MD, RD, PT, OT, RN, CM, TR, Pharmacy and SW  Financial Services: Other: [employer] Medicare  Follow-up services arranged: Outpatient: PT via [facility] in [place], DME: walker via [facility] and Patient/Family has no preference for HH/DME agencies  Comments (or additional information):  Patient/Family verbalized understanding of follow-up arrangements: Yes  Individual responsible for coordination of the follow-up plan: pt  Confirmed correct DME delivered: Cheila Wickstrom 08/08/2011    Dody Smartt  Also arranged OPOT.

## 2011-08-09 LAB — BASIC METABOLIC PANEL
CO2: 28 mEq/L (ref 19–32)
Chloride: 104 mEq/L (ref 96–112)
Creatinine, Ser: 1.44 mg/dL — ABNORMAL HIGH (ref 0.50–1.35)
GFR calc Af Amer: 56 mL/min — ABNORMAL LOW (ref >90)
Potassium: 4.3 mEq/L (ref 3.5–5.1)
Sodium: 140 mEq/L (ref 135–145)

## 2011-08-09 LAB — GLUCOSE, CAPILLARY: Glucose-Capillary: 133 mg/dL — ABNORMAL HIGH (ref 70–99)

## 2011-08-09 NOTE — Progress Notes (Signed)
Patient left via wheelchair. Tec and wife accompanied

## 2011-08-09 NOTE — Progress Notes (Signed)
Patient ID: Christopher [name], male   DOB: [birth year], 70 y.o.   MRN: [id number] Patient ID: Christopher [name], male   DOB: 11-06-1940, 70 y.o.   MRN: [id number] Subjective/Complaints: Review of Systems  HENT: Positive for neck pain.   Musculoskeletal: Positive for joint pain.  Neurological: Positive for tingling.  All other systems reviewed and are negative.  Excited to go home today. Objective: Vital Signs: Blood pressure 131/85, pulse 87, temperature 97.7 F (36.5 C), temperature source Oral, resp. rate 20, height 5\' 10"  (1.778 m), weight 93 kg (205 lb 0.4 oz), SpO2 95.00%. No results found. No results found for this basename: WBC:2,HGB:2,HCT:2,PLT:2 in the last 72 hours  Basename 08/09/11 0635 08/08/11 1013  NA 140 138  K 4.3 4.6  CL 104 98  CO2 28 28  GLUCOSE 135* 162*  BUN 36* 35*  CREATININE 1.44* 1.42*  CALCIUM 9.1 9.5   CBG (last 3)   Basename 08/09/11 0727 08/08/11 2028 08/08/11 1634  GLUCAP 133* 141* 179*    Wt Readings from Last 3 Encounters:  08/01/11 93 kg (205 lb 0.4 oz)  07/25/11 94.1 kg (207 lb 7.3 oz)  07/25/11 94.1 kg (207 lb 7.3 oz)    Physical Exam:  General appearance: alert, cooperative and no distress Head: Normocephalic, without obvious abnormality, atraumatic Eyes: conjunctivae/corneas clear. PERRL, EOM's intact. Fundi benign. Ears: normal TM's and external ear canals both ears Nose: Nares normal. Septum midline. Mucosa normal. No drainage or sinus tenderness. Throat: lips, mucosa, and tongue normal; teeth and gums normal Neck: no adenopathy, no carotid bruit, no JVD, supple, symmetrical, trachea midline and thyroid not enlarged, symmetric, no tenderness/mass/nodules Back: symmetric, no curvature. ROM normal. No CVA tenderness. Resp: clear to auscultation bilaterally Cardio: regular rate and rhythm, S1, S2 normal, no murmur, click, rub or gallop GI: soft, non-tender; bowel sounds normal; no masses,  no organomegaly Extremities: edema trace in lower  exts Pulses: 2+ and symmetric Skin: Skin color, texture, turgor normal. No rashes or lesions Neurologic: Deltoids 2+to 3/5 Biceps 3+/5,Triceps 3+/5.  Wrist and hands  4- / 5.  Lower extrs'  4+/5. Sensation 1+/2 in hands and lower exts.  DTR's 1+.  Cognitively intact. Incision/Wound: clean and intact   Assessment/Plan: 1. Functional deficits secondary to cervical stenosis with myelopathy which require 3+ hours per day of interdisciplinary therapy in a comprehensive inpatient rehab setting. Physiatrist is providing close team supervision and 24 hour management of active medical problems listed below. Physiatrist and rehab team continue to assess barriers to discharge/monitor patient progress toward functional and medical goals. Mobility: Bed Mobility Bed Mobility: Yes Rolling Right: 5: Supervision;With rail Rolling Left: 5: Supervision;With rail Right Sidelying to Sit: 4: Min assist Right Sidelying to Sit Details (indicate cue type and reason): pt. scooted to edge of bed with minimal assist Supine to Sit: 6: Modified independent (Device/Increase time) Transfers Transfers: Yes Sit to Stand: 5: Supervision Sit to Stand Details (indicate cue type and reason): unable Stand to Sit: 5: Supervision;With upper extremity assist Stand to Sit Details: intermittent uncontroleed descent Stand Pivot Transfers: 5: Supervision Squat Pivot Transfers: 5: Supervision;With armrests;Without upper extremity assistance Squat Pivot Transfer Details (indicate cue type and reason): bed to w/c to L Lateral/Scoot Transfers: 1: +2 Total assist Lateral/Scoot Transfer Details (indicate cue type and reason): +2 for transfers to wc (pt=50%) Ambulation/Gait Ambulation/Gait Assistance: 5: Supervision Ambulation/Gait Assistance Details (indicate cue type and reason): cues to look up, decreased push off and lands on outer L foot, controlled environment Ambulation  Distance (Feet): 160 Feet Assistive device: Rolling  walker Gait Pattern: Step-through pattern Stairs: Yes Stairs Assistance: 5: Supervision Stairs Assistance Details (indicate cue type and reason): first toe taps, then step ups, progressed to up/down 4 steps x 2 mod A d/t 2 uncontrolled descent on 6 in step, otherwise min A Stair Management Technique: Two rails Number of Stairs: 12  Wheelchair Mobility Wheelchair Mobility: Yes Wheelchair Assistance: 4: Min Education officer, museum: Both upper extremities;Both lower extermities Wheelchair Parts Management: Needs assistance Distance: 150 ADL:   Cognition: Cognition Overall Cognitive Status: Appears within functional limits for tasks assessed Arousal/Alertness: Awake/alert Orientation Level: Oriented X4 Attention: Selective Selective Attention: Appears intact Memory: Appears intact Awareness: Appears intact Problem Solving: Appears intact Safety/Judgment: Appears intact Cognition Arousal/Alertness: Awake/alert Orientation Level: Oriented X4   1. Cervical stenosis with myelopathy. Status post cervical 3-4 laminectomy with decompression of spinal and bilateral foraminotomies December 14. No plan for cervical collar.  Discussed exercise regimen and care that needs to be taken to avoid rotator cuff irritation. He'll see me in my office next month.  2.. DVT Prophylaxis/Anticoagulation: Dopplers lower extremity negative. Continuous Lovenox as directed. Monitor platelet counts any signs of bleeding.   3.. Pain Management: Oxycodone as needed as well as scheduled Elavil at bedtime. Monitor with increased activity.  Seems to be doing fairly well at present.   voltaren for right knee pain hellpful. Would like a script for home use.  4. Diabetes mellitus. Hemoglobin A1c of 7.4. Continue Glucotrol 10 mg daily . Glucophage. Check blood sugars a.c. and at bedtime. Provide full diabetic teaching. Sugars showing improvement.   5 Chronic renal insufficiency with creatinine 1.5. Monitor  hydration and followup labs. Low-dose Lasix resumed at 20 mg daily. BUN/CR stable so far.  Will recheck today with dc pending for tomorrow.  6. Hypertension. Toprol 100 mg daily. Monitor with increased activity. Quinapril recently discontinued due to elevated creatinine.     7. Acute bronchitis. Zithromax x5 days. Continue Mucinex and incentive spirometry.  Doing well here without cough or respiratory distress.  No changes at present.  8. Lower extremity edema: increased lasix to 40qd. Edema essentially resolved.  - KHT  -elevate legs.  -follow up labs stable.  Would send him home on qday       LOS (Days) 8 A FACE TO FACE EVALUATION WAS PERFORMED  Icesis Renn T 08/09/2011, 8:07 AM

## 2011-09-05 ENCOUNTER — Encounter: Payer: Medicare Other | Attending: Physical Medicine & Rehabilitation | Admitting: Physical Medicine & Rehabilitation

## 2012-01-12 IMAGING — RF DG CERVICAL SPINE 2 OR 3 VIEWS
1 series · 2 of 2 positions shown · non-contrast
Comparison: CT myelogram 05/30/2011

CLINICAL DATA: C3-4 posterior fusion

CERVICAL SPINE - 2-3 VIEW

[Series 1: run · 2 of 2 slices shown]
[im 1/2]
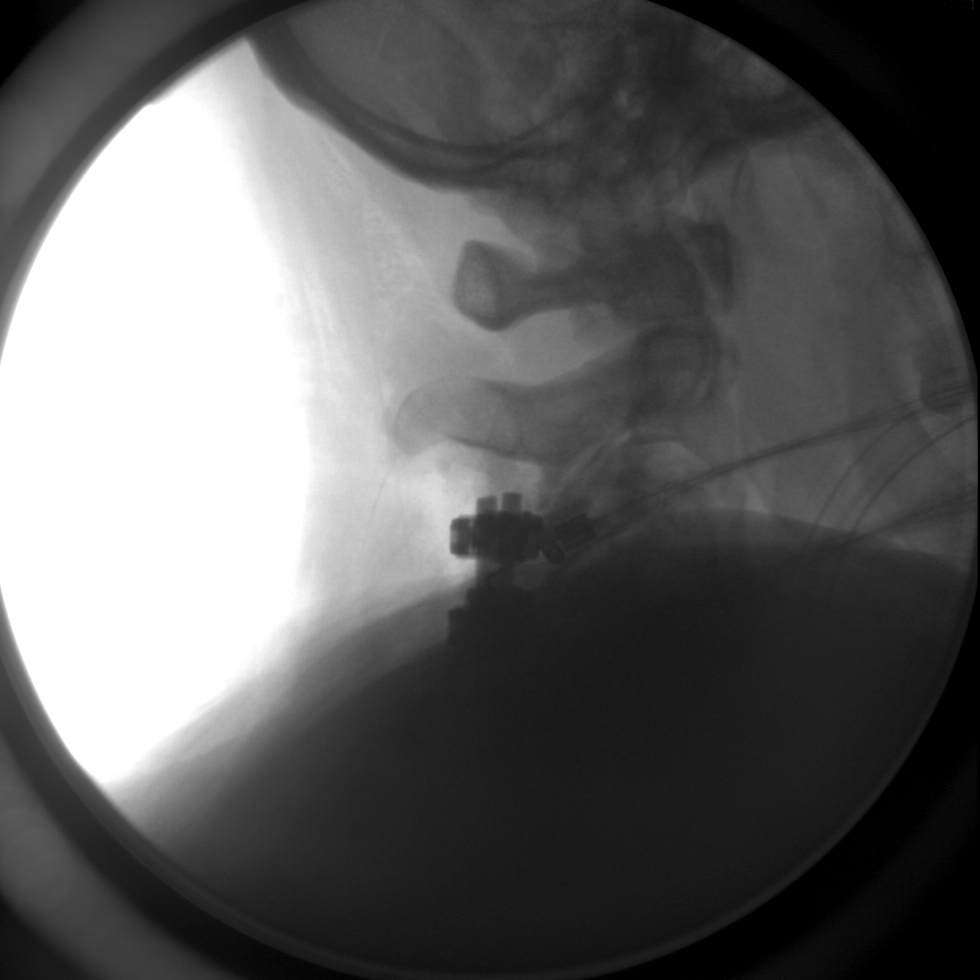
[im 2/2]
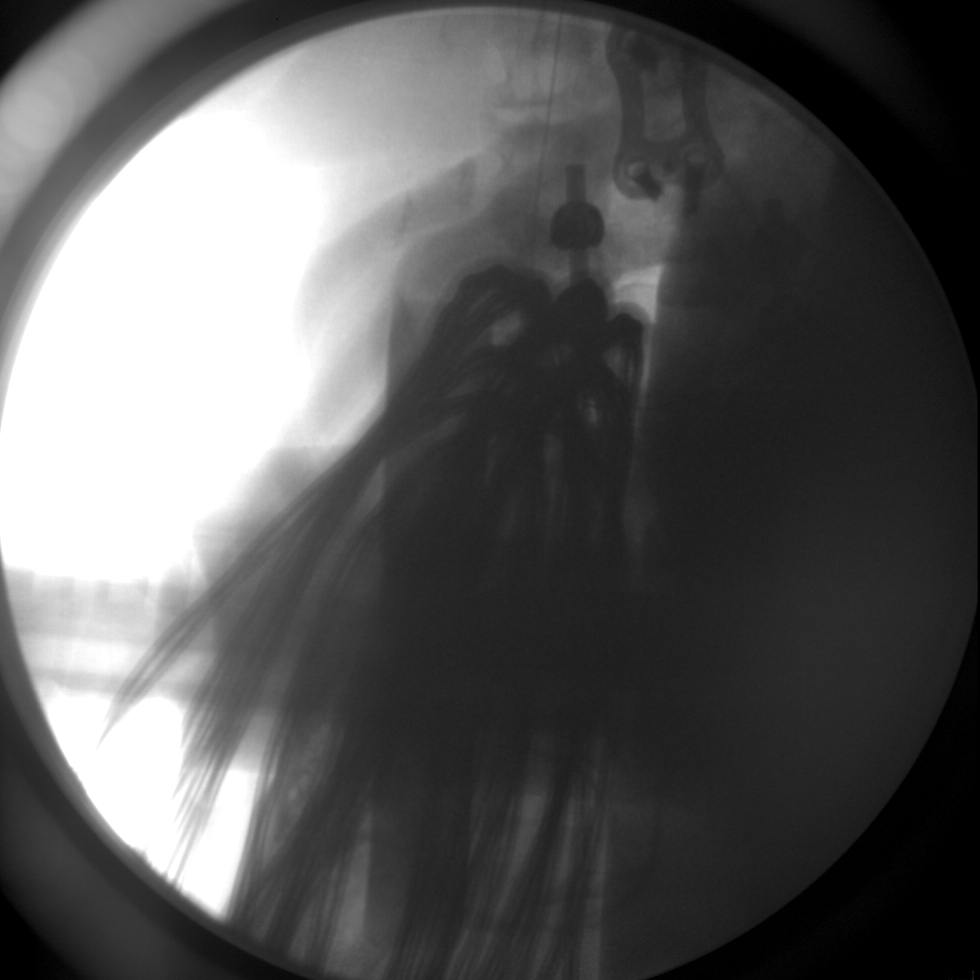

[2 of 2 positions shown; findings below may reference images not displayed]

FINDINGS: AP and lateral C-arm images were obtained.  Limited
visualization of the cervical spine extending down to C3 on the
lateral view.

Posterior hardware is present at C3-4.  This is only partially
imaged on this study.  Further radiographs are suggested to
evaluate for hardware positioning.
IMPRESSION: Posterior hardware fusion at C3-4 with limited visualization of the
cervical spine and hardware.

## 2012-03-01 ENCOUNTER — Other Ambulatory Visit: Payer: Self-pay | Admitting: Neurosurgery

## 2012-03-01 DIAGNOSIS — M549 Dorsalgia, unspecified: Secondary | ICD-10-CM

## 2012-03-06 ENCOUNTER — Ambulatory Visit
Admission: RE | Admit: 2012-03-06 | Discharge: 2012-03-06 | Disposition: A | Discharge status: Home / Self Care | Payer: Medicare Other | Source: Ambulatory Visit | Attending: Neurosurgery | Admitting: Neurosurgery

## 2012-03-06 DIAGNOSIS — M549 Dorsalgia, unspecified: Secondary | ICD-10-CM

## 2012-03-09 ENCOUNTER — Other Ambulatory Visit: Payer: Medicare Other

## 2012-03-16 ENCOUNTER — Other Ambulatory Visit: Payer: Self-pay | Admitting: Neurosurgery

## 2012-03-23 ENCOUNTER — Encounter (HOSPITAL_COMMUNITY): Payer: Self-pay | Admitting: Pharmacy Technician

## 2012-03-31 NOTE — Pre-Procedure Instructions (Signed)
[address]  03/31/2012   Your procedure is scheduled on:  Wednesday, August 28th  Report to [facility] Stay [facility] at 1030 AM.  Call this number if you have problems the morning of surgery: [phone]   Remember:   Do not eat food or drink:After Midnight.  Take these medicines the morning of surgery with A SIP OF WATER: norvasc, toprol   Do not wear jewelry..  Do not wear lotions, powders, or perfumes.   Do not shave 48 hours prior to surgery. Men may shave face and neck.  Do not bring valuables to the hospital.  Contacts, dentures or bridgework may not be worn into surgery.  Leave suitcase in the car. After surgery it may be brought to your room.  For patients admitted to the hospital, checkout time is 11:00 AM the day of discharge.   Special Instructions: CHG Shower Use Special Wash: 1/2 bottle night before surgery and 1/2 bottle morning of surgery.   Please read over the following fact sheets that you were given: Pain Booklet, Coughing and Deep Breathing, MRSA Information and Surgical Site Infection Prevention

## 2012-04-01 ENCOUNTER — Encounter (HOSPITAL_COMMUNITY)
Admission: RE | Admit: 2012-04-01 | Discharge: 2012-04-01 | Disposition: A | Discharge status: Home / Self Care | Payer: Medicare Other | Source: Ambulatory Visit | Attending: Neurosurgery | Admitting: Neurosurgery

## 2012-04-01 ENCOUNTER — Encounter (HOSPITAL_COMMUNITY): Payer: Self-pay

## 2012-04-01 HISTORY — DX: Nocturia: R35.1

## 2012-04-01 HISTORY — DX: Hyperlipidemia, unspecified: E78.5

## 2012-04-01 LAB — BASIC METABOLIC PANEL WITH GFR
BUN: 43 mg/dL — ABNORMAL HIGH (ref 6–23)
CO2: 25 meq/L (ref 19–32)
Calcium: 9.2 mg/dL (ref 8.4–10.5)
Chloride: 104 meq/L (ref 96–112)
Creatinine, Ser: 1.63 mg/dL — ABNORMAL HIGH (ref 0.50–1.35)
GFR calc Af Amer: 48 mL/min — ABNORMAL LOW
GFR calc non Af Amer: 41 mL/min — ABNORMAL LOW
Glucose, Bld: 355 mg/dL — ABNORMAL HIGH (ref 70–99)
Potassium: 5.4 meq/L — ABNORMAL HIGH (ref 3.5–5.1)
Sodium: 141 meq/L (ref 135–145)

## 2012-04-01 LAB — SURGICAL PCR SCREEN: Staphylococcus aureus: NEGATIVE

## 2012-04-01 LAB — CBC
HCT: 37.3 % — ABNORMAL LOW (ref 39.0–52.0)
Hemoglobin: 12.6 g/dL — ABNORMAL LOW (ref 13.0–17.0)
MCH: 31.7 pg (ref 26.0–34.0)
MCHC: 33.8 g/dL (ref 30.0–36.0)
MCV: 94 fL (ref 78.0–100.0)
Platelets: 165 K/uL (ref 150–400)
RBC: 3.97 MIL/uL — ABNORMAL LOW (ref 4.22–5.81)
RDW: 14.3 % (ref 11.5–15.5)
WBC: 5.7 K/uL (ref 4.0–10.5)

## 2012-04-05 NOTE — Consult Note (Addendum)
Anesthesia chart review: Patient is a 71 year old male scheduled for T9-10 laminectomy by Dr. Jeral Fruit on 04/07/2012.  History includes C 3-4 posterior fusion 07/25/11, former smoker, HTN, CAD/MI '03, CKD, HLD, nocturia, anxiety, DM2.  PCP is Dr. Asencion Partridge at [facility] in [place].  His Cardiologist is Dr. Mickie Hillier at [facility].  She last saw him on 06/19/11 for a preoperative evaluation.  EKG then showed NSR, anterior septal infarct.  She felt it was unchanged from a prior EKG she had received from his PCP. She ordered a nuclear stress test which was done on 06/25/11 and showed no evidence of inducible myocardial ischemia, EF 62% with norm LV systolic function.  (Of note, his EKG done at [facility] from 06/10/11 had similar findings, but also showed LAD with possible inferior infarct.)   Echo on 07/30/11 showed: - Left ventricle: The cavity size was normal. There was moderate concentric hypertrophy. Systolic function was normal. The estimated ejection fraction was in the range of 50% to 55%. Wall motion was normal; there were no regional wall motion abnormalities. Doppler parameters are consistent with abnormal left ventricular relaxation (grade 1 diastolic dysfunction). There was no evidence of elevated ventricular filling pressure by Doppler parameters. - Ascending aorta: The ascending aorta was mildly dilated. - Left atrium: The atrium was mildly dilated. - Right ventricle: Systolic function was normal.  CXR on 08/01/11 showed no active lung disease. Persistent slight elevation of the right hemidiaphragm.   Labs noted.  K+ 5.4, Cr 1.63, glucose 355.  H/H 12.6/37.3.  (He has known CKD. Currently, his latest comparison labs available are from December 2012 and show a Cr of 1.44 on 08/09/11.)  Will plan to recheck a BMET on arrival.   Medication list includes Lasix, KCl, Elavil, Norvasc, ASA, Voltaren PRN, Neurontin, glipizide, Lantus, metformin, Toprol, MVI, Fish  Oil, quinapril.  I've requested records from his PCP.  I called and spoke with Mr. Dupuis.  He denies any new CV symptoms since he was last evaluated by Cardiology.  He reports that his fasting glucose is usually around 140.  He had just eaten at [employer] prior to his PAT appointment.  If his BMET results are reasonable, and he has no new CV symptoms, then anticipate he can proceed as planned.  Shonna Chock, PA-C 04/05/12 1741  Addendum:  04/06/12 1000 Reviewed records from [facility].  Labs on 01/21/12 showed an A1C of 7.6, Cr 1.23.  His glipizide dose was increased following his A1C result.  As above, he will need a repeat BMET on arrival to ensure stability of his renal function since he has had further increase in his Cr (up from 1.23 - 1.44 since 08/09/11).  Cr, K+, and glucose results called to Shanda Bumps at Dr. Cassandria Santee office.

## 2012-04-07 ENCOUNTER — Inpatient Hospital Stay (HOSPITAL_COMMUNITY)
Admission: RE | Admit: 2012-04-07 | Discharge: 2012-04-09 | DRG: 491 | Disposition: A | Discharge status: Home / Self Care | Payer: Medicare Other | Source: Ambulatory Visit | Attending: Neurosurgery | Admitting: Neurosurgery

## 2012-04-07 ENCOUNTER — Ambulatory Visit (HOSPITAL_COMMUNITY): Payer: Medicare Other

## 2012-04-07 ENCOUNTER — Ambulatory Visit (HOSPITAL_COMMUNITY): Payer: Medicare Other | Admitting: Vascular Surgery

## 2012-04-07 ENCOUNTER — Encounter (HOSPITAL_COMMUNITY): Payer: Self-pay | Admitting: *Deleted

## 2012-04-07 ENCOUNTER — Encounter (HOSPITAL_COMMUNITY): Payer: Self-pay | Admitting: Vascular Surgery

## 2012-04-07 ENCOUNTER — Encounter (HOSPITAL_COMMUNITY)
Admission: RE | Disposition: A | Discharge status: Home / Self Care | Payer: Self-pay | Source: Ambulatory Visit | Attending: Neurosurgery

## 2012-04-07 DIAGNOSIS — I2510 Atherosclerotic heart disease of native coronary artery without angina pectoris: Secondary | ICD-10-CM | POA: Diagnosis present

## 2012-04-07 DIAGNOSIS — N189 Chronic kidney disease, unspecified: Secondary | ICD-10-CM | POA: Diagnosis present

## 2012-04-07 DIAGNOSIS — I252 Old myocardial infarction: Secondary | ICD-10-CM

## 2012-04-07 DIAGNOSIS — E119 Type 2 diabetes mellitus without complications: Secondary | ICD-10-CM | POA: Diagnosis present

## 2012-04-07 DIAGNOSIS — Z79899 Other long term (current) drug therapy: Secondary | ICD-10-CM

## 2012-04-07 DIAGNOSIS — Z794 Long term (current) use of insulin: Secondary | ICD-10-CM

## 2012-04-07 DIAGNOSIS — Z01812 Encounter for preprocedural laboratory examination: Secondary | ICD-10-CM

## 2012-04-07 DIAGNOSIS — E785 Hyperlipidemia, unspecified: Secondary | ICD-10-CM | POA: Diagnosis present

## 2012-04-07 DIAGNOSIS — F411 Generalized anxiety disorder: Secondary | ICD-10-CM | POA: Diagnosis present

## 2012-04-07 DIAGNOSIS — M4714 Other spondylosis with myelopathy, thoracic region: Principal | ICD-10-CM | POA: Diagnosis present

## 2012-04-07 DIAGNOSIS — Z96619 Presence of unspecified artificial shoulder joint: Secondary | ICD-10-CM

## 2012-04-07 DIAGNOSIS — I129 Hypertensive chronic kidney disease with stage 1 through stage 4 chronic kidney disease, or unspecified chronic kidney disease: Secondary | ICD-10-CM | POA: Diagnosis present

## 2012-04-07 DIAGNOSIS — IMO0002 Reserved for concepts with insufficient information to code with codable children: Secondary | ICD-10-CM | POA: Diagnosis present

## 2012-04-07 DIAGNOSIS — M48061 Spinal stenosis, lumbar region without neurogenic claudication: Secondary | ICD-10-CM | POA: Diagnosis present

## 2012-04-07 DIAGNOSIS — Z9861 Coronary angioplasty status: Secondary | ICD-10-CM

## 2012-04-07 DIAGNOSIS — Z981 Arthrodesis status: Secondary | ICD-10-CM

## 2012-04-07 DIAGNOSIS — Z7982 Long term (current) use of aspirin: Secondary | ICD-10-CM

## 2012-04-07 DIAGNOSIS — Z87891 Personal history of nicotine dependence: Secondary | ICD-10-CM

## 2012-04-07 HISTORY — DX: Angina pectoris, unspecified: I20.9

## 2012-04-07 HISTORY — DX: Type 2 diabetes mellitus without complications: E11.9

## 2012-04-07 HISTORY — DX: Other chronic pain: G89.29

## 2012-04-07 HISTORY — PX: THORACIC DISCECTOMY: SHX100

## 2012-04-07 HISTORY — DX: Pneumonia, unspecified organism: J18.9

## 2012-04-07 HISTORY — DX: Dorsalgia, unspecified: M54.9

## 2012-04-07 HISTORY — DX: Calculus of kidney: N20.0

## 2012-04-07 LAB — BASIC METABOLIC PANEL
BUN: 33 mg/dL — ABNORMAL HIGH (ref 6–23)
CO2: 23 mEq/L (ref 19–32)
Calcium: 9.4 mg/dL (ref 8.4–10.5)
Chloride: 108 mEq/L (ref 96–112)
Creatinine, Ser: 1.26 mg/dL (ref 0.50–1.35)
Glucose, Bld: 177 mg/dL — ABNORMAL HIGH (ref 70–99)

## 2012-04-07 SURGERY — THORACIC DISCECTOMY
Anesthesia: General | Wound class: Clean

## 2012-04-07 MED ORDER — ONDANSETRON HCL 4 MG/2ML IJ SOLN: 4.0000 mg | INTRAMUSCULAR | Status: DC | PRN

## 2012-04-07 MED ORDER — FUROSEMIDE 40 MG PO TABS
40.0000 mg | ORAL_TABLET | Freq: Every day | ORAL | Status: DC
  Administered 2012-04-07 – 2012-04-09 (×3): 40 mg via ORAL
  Filled 2012-04-07 (×3): qty 1

## 2012-04-07 MED ORDER — 0.9 % SODIUM CHLORIDE (POUR BTL) OPTIME
TOPICAL | Status: DC | PRN
  Administered 2012-04-07: 1000 mL

## 2012-04-07 MED ORDER — MORPHINE SULFATE 4 MG/ML IJ SOLN
4.0000 mg | INTRAMUSCULAR | Status: DC | PRN
  Administered 2012-04-07 – 2012-04-09 (×8): 4 mg via INTRAVENOUS
  Filled 2012-04-07 (×9): qty 1

## 2012-04-07 MED ORDER — DEXAMETHASONE SODIUM PHOSPHATE 4 MG/ML IJ SOLN
INTRAMUSCULAR | Status: DC | PRN
  Administered 2012-04-07: 8 mg via INTRAVENOUS

## 2012-04-07 MED ORDER — CEFAZOLIN SODIUM 1-5 GM-% IV SOLN
1.0000 g | Freq: Three times a day (TID) | INTRAVENOUS | Status: AC
  Administered 2012-04-07 – 2012-04-08 (×2): 1 g via INTRAVENOUS
  Filled 2012-04-07 (×2): qty 50

## 2012-04-07 MED ORDER — ROCURONIUM BROMIDE 100 MG/10ML IV SOLN
INTRAVENOUS | Status: DC | PRN
  Administered 2012-04-07: 50 mg via INTRAVENOUS

## 2012-04-07 MED ORDER — NEOSTIGMINE METHYLSULFATE 1 MG/ML IJ SOLN
INTRAMUSCULAR | Status: DC | PRN
  Administered 2012-04-07: 5 mg via INTRAVENOUS

## 2012-04-07 MED ORDER — GLIPIZIDE 10 MG PO TABS
10.0000 mg | ORAL_TABLET | Freq: Two times a day (BID) | ORAL | Status: DC
  Administered 2012-04-07 – 2012-04-09 (×4): 10 mg via ORAL
  Filled 2012-04-07 (×6): qty 1

## 2012-04-07 MED ORDER — METFORMIN HCL 500 MG PO TABS
1000.0000 mg | ORAL_TABLET | Freq: Every day | ORAL | Status: DC
  Administered 2012-04-08 – 2012-04-09 (×2): 1000 mg via ORAL
  Filled 2012-04-07 (×3): qty 2

## 2012-04-07 MED ORDER — PROPOFOL 10 MG/ML IV BOLUS
INTRAVENOUS | Status: DC | PRN
  Administered 2012-04-07: 200 mg via INTRAVENOUS

## 2012-04-07 MED ORDER — CEFAZOLIN SODIUM-DEXTROSE 2-3 GM-% IV SOLR
INTRAVENOUS | Status: DC | PRN
  Administered 2012-04-07: 2 g via INTRAVENOUS

## 2012-04-07 MED ORDER — ONDANSETRON HCL 4 MG/2ML IJ SOLN
INTRAMUSCULAR | Status: DC | PRN
  Administered 2012-04-07: 4 mg via INTRAVENOUS

## 2012-04-07 MED ORDER — POTASSIUM CHLORIDE CRYS ER 20 MEQ PO TBCR
20.0000 meq | EXTENDED_RELEASE_TABLET | Freq: Every day | ORAL | Status: DC
  Administered 2012-04-07 – 2012-04-09 (×3): 20 meq via ORAL
  Filled 2012-04-07 (×3): qty 1

## 2012-04-07 MED ORDER — AMITRIPTYLINE HCL 10 MG PO TABS
10.0000 mg | ORAL_TABLET | Freq: Every day | ORAL | Status: DC
  Administered 2012-04-08: 10 mg via ORAL
  Filled 2012-04-07 (×4): qty 1

## 2012-04-07 MED ORDER — ACETAMINOPHEN 325 MG PO TABS: 650.0000 mg | ORAL_TABLET | ORAL | Status: DC | PRN

## 2012-04-07 MED ORDER — THROMBIN 5000 UNITS EX SOLR
OROMUCOSAL | Status: DC | PRN
  Administered 2012-04-07: 16:00:00 via TOPICAL

## 2012-04-07 MED ORDER — DIAZEPAM 5 MG PO TABS: 5.0000 mg | ORAL_TABLET | Freq: Four times a day (QID) | ORAL | Status: DC | PRN

## 2012-04-07 MED ORDER — EPHEDRINE SULFATE 50 MG/ML IJ SOLN
INTRAMUSCULAR | Status: DC | PRN
  Administered 2012-04-07: 15 mg via INTRAVENOUS
  Administered 2012-04-07: 10 mg via INTRAVENOUS
  Administered 2012-04-07: 5 mg via INTRAVENOUS

## 2012-04-07 MED ORDER — LIDOCAINE HCL (CARDIAC) 20 MG/ML IV SOLN
INTRAVENOUS | Status: DC | PRN
  Administered 2012-04-07: 100 mg via INTRAVENOUS

## 2012-04-07 MED ORDER — PHENOL 1.4 % MT LIQD
1.0000 | OROMUCOSAL | Status: DC | PRN
  Filled 2012-04-07: qty 177

## 2012-04-07 MED ORDER — THROMBIN 5000 UNITS EX SOLR
CUTANEOUS | Status: DC | PRN
  Administered 2012-04-07 (×2): 5000 [IU] via TOPICAL

## 2012-04-07 MED ORDER — METOPROLOL SUCCINATE ER 25 MG PO TB24
25.0000 mg | ORAL_TABLET | Freq: Every day | ORAL | Status: DC
  Administered 2012-04-08 – 2012-04-09 (×2): 25 mg via ORAL
  Filled 2012-04-07 (×2): qty 1

## 2012-04-07 MED ORDER — HEMOSTATIC AGENTS (NO CHARGE) OPTIME
TOPICAL | Status: DC | PRN
  Administered 2012-04-07: 1 via TOPICAL

## 2012-04-07 MED ORDER — METFORMIN HCL 500 MG PO TABS: 1000.0000 mg | ORAL_TABLET | Freq: Every day | ORAL | Status: DC

## 2012-04-07 MED ORDER — SODIUM CHLORIDE 0.9 % IJ SOLN
3.0000 mL | Freq: Two times a day (BID) | INTRAMUSCULAR | Status: DC
  Administered 2012-04-07 – 2012-04-09 (×3): 3 mL via INTRAVENOUS

## 2012-04-07 MED ORDER — INSULIN ASPART 100 UNIT/ML ~~LOC~~ SOLN
0.0000 [IU] | Freq: Three times a day (TID) | SUBCUTANEOUS | Status: DC
  Administered 2012-04-08: 8 [IU] via SUBCUTANEOUS
  Administered 2012-04-08: 5 [IU] via SUBCUTANEOUS
  Administered 2012-04-09: 3 [IU] via SUBCUTANEOUS

## 2012-04-07 MED ORDER — ONDANSETRON HCL 4 MG/2ML IJ SOLN: 4.0000 mg | Freq: Once | INTRAMUSCULAR | Status: DC | PRN

## 2012-04-07 MED ORDER — CEFAZOLIN SODIUM-DEXTROSE 2-3 GM-% IV SOLR
INTRAVENOUS | Status: AC
  Filled 2012-04-07: qty 50

## 2012-04-07 MED ORDER — GLYCOPYRROLATE 0.2 MG/ML IJ SOLN
INTRAMUSCULAR | Status: DC | PRN
  Administered 2012-04-07: .7 mg via INTRAVENOUS

## 2012-04-07 MED ORDER — SODIUM CHLORIDE 0.9 % IV SOLN
INTRAVENOUS | Status: DC
  Administered 2012-04-07: 22:00:00 via INTRAVENOUS

## 2012-04-07 MED ORDER — HYDROMORPHONE HCL PF 1 MG/ML IJ SOLN
0.2500 mg | INTRAMUSCULAR | Status: DC | PRN
  Administered 2012-04-07 (×2): 0.5 mg via INTRAVENOUS

## 2012-04-07 MED ORDER — AMLODIPINE BESYLATE 10 MG PO TABS
10.0000 mg | ORAL_TABLET | Freq: Every day | ORAL | Status: DC
  Administered 2012-04-08 – 2012-04-09 (×2): 10 mg via ORAL
  Filled 2012-04-07 (×2): qty 1

## 2012-04-07 MED ORDER — ASPIRIN EC 81 MG PO TBEC
81.0000 mg | DELAYED_RELEASE_TABLET | Freq: Every day | ORAL | Status: DC
  Administered 2012-04-07 – 2012-04-09 (×3): 81 mg via ORAL
  Filled 2012-04-07 (×3): qty 1

## 2012-04-07 MED ORDER — LISINOPRIL 40 MG PO TABS
40.0000 mg | ORAL_TABLET | Freq: Every day | ORAL | Status: DC
  Administered 2012-04-08 – 2012-04-09 (×2): 40 mg via ORAL
  Filled 2012-04-07 (×2): qty 1

## 2012-04-07 MED ORDER — MIDAZOLAM HCL 5 MG/5ML IJ SOLN
INTRAMUSCULAR | Status: DC | PRN
  Administered 2012-04-07: 2 mg via INTRAVENOUS

## 2012-04-07 MED ORDER — HYDROMORPHONE HCL PF 1 MG/ML IJ SOLN
INTRAMUSCULAR | Status: AC
  Filled 2012-04-07: qty 1

## 2012-04-07 MED ORDER — SODIUM CHLORIDE 0.9 % IV SOLN: 250.0000 mL | INTRAVENOUS | Status: DC

## 2012-04-07 MED ORDER — SODIUM CHLORIDE 0.9 % IJ SOLN: 3.0000 mL | INTRAMUSCULAR | Status: DC | PRN

## 2012-04-07 MED ORDER — FENTANYL CITRATE 0.05 MG/ML IJ SOLN
INTRAMUSCULAR | Status: DC | PRN
  Administered 2012-04-07 (×2): 50 ug via INTRAVENOUS
  Administered 2012-04-07: 100 ug via INTRAVENOUS

## 2012-04-07 MED ORDER — QUINAPRIL HCL 10 MG PO TABS: 40.0000 mg | ORAL_TABLET | Freq: Every day | ORAL | Status: DC

## 2012-04-07 MED ORDER — ACETAMINOPHEN 650 MG RE SUPP: 650.0000 mg | RECTAL | Status: DC | PRN

## 2012-04-07 MED ORDER — OXYCODONE-ACETAMINOPHEN 5-325 MG PO TABS: 1.0000 | ORAL_TABLET | ORAL | Status: DC | PRN

## 2012-04-07 MED ORDER — LACTATED RINGERS IV SOLN
INTRAVENOUS | Status: DC | PRN
  Administered 2012-04-07 (×2): via INTRAVENOUS

## 2012-04-07 MED ORDER — MENTHOL 3 MG MT LOZG
1.0000 | LOZENGE | OROMUCOSAL | Status: DC | PRN
  Filled 2012-04-07: qty 9

## 2012-04-07 SURGICAL SUPPLY — 61 items
APL SKNCLS STERI-STRIP NONHPOA (GAUZE/BANDAGES/DRESSINGS) ×1
BENZOIN TINCTURE PRP APPL 2/3 (GAUZE/BANDAGES/DRESSINGS) ×2 IMPLANT
BLADE SURG ROTATE 9660 (MISCELLANEOUS) ×1 IMPLANT
BUR ACORN 6.0 (BURR) ×2 IMPLANT
BUR MATCHSTICK NEURO 3.0 LAGG (BURR) ×1 IMPLANT
CANISTER SUCTION 2500CC (MISCELLANEOUS) ×2 IMPLANT
CLOTH BEACON ORANGE TIMEOUT ST (SAFETY) ×2 IMPLANT
CONT SPEC 4OZ CLIKSEAL STRL BL (MISCELLANEOUS) ×2 IMPLANT
DRAPE LAPAROTOMY 100X72X124 (DRAPES) ×2 IMPLANT
DRAPE MICROSCOPE LEICA (MISCELLANEOUS) ×2 IMPLANT
DRAPE POUCH INSTRU U-SHP 10X18 (DRAPES) ×2 IMPLANT
DRAPE PROXIMA HALF (DRAPES) ×1 IMPLANT
DRSG PAD ABDOMINAL 8X10 ST (GAUZE/BANDAGES/DRESSINGS) ×2 IMPLANT
DURAPREP 26ML APPLICATOR (WOUND CARE) ×2 IMPLANT
ELECT REM PT RETURN 9FT ADLT (ELECTROSURGICAL) ×2
ELECTRODE REM PT RTRN 9FT ADLT (ELECTROSURGICAL) ×1 IMPLANT
EVACUATOR 3/16  PVC DRAIN (DRAIN) ×1
EVACUATOR 3/16 PVC DRAIN (DRAIN) IMPLANT
GAUZE SPONGE 4X4 16PLY XRAY LF (GAUZE/BANDAGES/DRESSINGS) IMPLANT
GLOVE BIOGEL M 8.0 STRL (GLOVE) ×2 IMPLANT
GLOVE BIOGEL PI IND STRL 8.5 (GLOVE) IMPLANT
GLOVE BIOGEL PI INDICATOR 8.5 (GLOVE) ×1
GLOVE ECLIPSE 6.5 STRL STRAW (GLOVE) ×1 IMPLANT
GLOVE EXAM NITRILE LRG STRL (GLOVE) IMPLANT
GLOVE EXAM NITRILE MD LF STRL (GLOVE) IMPLANT
GLOVE EXAM NITRILE XL STR (GLOVE) IMPLANT
GLOVE EXAM NITRILE XS STR PU (GLOVE) IMPLANT
GLOVE SURG SS PI 8.0 STRL IVOR (GLOVE) ×2 IMPLANT
GOWN BRE IMP SLV AUR LG STRL (GOWN DISPOSABLE) ×3 IMPLANT
GOWN BRE IMP SLV AUR XL STRL (GOWN DISPOSABLE) IMPLANT
GOWN STRL REIN 2XL LVL4 (GOWN DISPOSABLE) ×1 IMPLANT
HEMOSTAT POWDER KIT SURGIFOAM (HEMOSTASIS) ×1 IMPLANT
KIT BASIN OR (CUSTOM PROCEDURE TRAY) ×2 IMPLANT
KIT ROOM TURNOVER OR (KITS) ×2 IMPLANT
NDL HYPO 18GX1.5 BLUNT FILL (NEEDLE) IMPLANT
NDL HYPO 21X1.5 SAFETY (NEEDLE) IMPLANT
NDL HYPO 25X1 1.5 SAFETY (NEEDLE) IMPLANT
NDL SPNL 20GX3.5 QUINCKE YW (NEEDLE) IMPLANT
NEEDLE HYPO 18GX1.5 BLUNT FILL (NEEDLE) IMPLANT
NEEDLE HYPO 21X1.5 SAFETY (NEEDLE) IMPLANT
NEEDLE HYPO 25X1 1.5 SAFETY (NEEDLE) IMPLANT
NEEDLE SPNL 20GX3.5 QUINCKE YW (NEEDLE) IMPLANT
NS IRRIG 1000ML POUR BTL (IV SOLUTION) ×2 IMPLANT
PACK LAMINECTOMY NEURO (CUSTOM PROCEDURE TRAY) ×2 IMPLANT
PAD ARMBOARD 7.5X6 YLW CONV (MISCELLANEOUS) ×6 IMPLANT
PATTIES SURGICAL .5 X1 (DISPOSABLE) ×2 IMPLANT
RUBBERBAND STERILE (MISCELLANEOUS) ×4 IMPLANT
SPONGE GAUZE 4X4 12PLY (GAUZE/BANDAGES/DRESSINGS) ×2 IMPLANT
SPONGE LAP 4X18 X RAY DECT (DISPOSABLE) IMPLANT
SPONGE SURGIFOAM ABS GEL SZ50 (HEMOSTASIS) ×2 IMPLANT
STRIP CLOSURE SKIN 1/2X4 (GAUZE/BANDAGES/DRESSINGS) ×2 IMPLANT
SUT VIC AB 0 CT1 18XCR BRD8 (SUTURE) ×1 IMPLANT
SUT VIC AB 0 CT1 8-18 (SUTURE) ×2
SUT VIC AB 2-0 CP2 18 (SUTURE) ×2 IMPLANT
SUT VIC AB 3-0 SH 8-18 (SUTURE) ×2 IMPLANT
SYR 20CC LL (SYRINGE) IMPLANT
SYR 20ML ECCENTRIC (SYRINGE) ×2 IMPLANT
SYR 5ML LL (SYRINGE) IMPLANT
TOWEL OR 17X24 6PK STRL BLUE (TOWEL DISPOSABLE) ×2 IMPLANT
TOWEL OR 17X26 10 PK STRL BLUE (TOWEL DISPOSABLE) ×2 IMPLANT
WATER STERILE IRR 1000ML POUR (IV SOLUTION) ×2 IMPLANT

## 2012-04-07 NOTE — Anesthesia Procedure Notes (Addendum)
Performed by: Rogelia Boga   Procedure Name: Intubation Date/Time: 04/07/2012 2:27 PM Performed by: Rogelia Boga Pre-anesthesia Checklist: Patient identified, Emergency Drugs available, Suction available, Patient being monitored and Timeout performed Patient Re-evaluated:Patient Re-evaluated prior to inductionOxygen Delivery Method: Circle system utilized Preoxygenation: Pre-oxygenation with 100% oxygen Intubation Type: IV induction Ventilation: Mask ventilation without difficulty and Oral airway inserted - appropriate to patient size Laryngoscope Size: Mac and 4 Grade View: Grade II Tube type: Oral Tube size: 7.5 mm Number of attempts: 1 Airway Equipment and Method: Patient positioned with wedge pillow and Stylet Placement Confirmation: ETT inserted through vocal cords under direct vision,  positive ETCO2,  CO2 detector and breath sounds checked- equal and bilateral Secured at: 21 cm Tube secured with: Tape Dental Injury: Teeth and Oropharynx as per pre-operative assessment

## 2012-04-07 NOTE — Addendum Note (Signed)
Addendum  created 04/07/12 1649 by Kerby Nora, MD   Modules edited:Anesthesia Attestations

## 2012-04-07 NOTE — Preoperative (Signed)
Beta Blockers   Reason not to administer Beta Blockers:Not Applicable 

## 2012-04-07 NOTE — Transfer of Care (Signed)
Immediate Anesthesia Transfer of Care Note  Patient: Christopher [name]  Procedure(s) Performed: Procedure(s) (LRB): THORACIC DISCECTOMY (N/A)  Patient Location: PACU  Anesthesia Type: General  Level of Consciousness: awake, alert  and oriented  Airway & Oxygen Therapy: Patient Spontanous Breathing and Patient connected to nasal cannula oxygen  Post-op Assessment: Report given to PACU RN and Post -op Vital signs reviewed and stable  Post vital signs: Reviewed and stable  Complications: No apparent anesthesia complications

## 2012-04-07 NOTE — Progress Notes (Signed)
Or note [phone]

## 2012-04-07 NOTE — H&P (Signed)
Christopher [name] is an 71 y.o. male.   Chief Complaint: lbp HPI: patient complaining of lower thoracic pain with radiation to both groins ,getting worse associated with proximal weakness of both legs as well pain to both feet. Myelogram showed severe stenosis at t9-10.  Past Medical History  Diagnosis Date  . Arthritis   . oral control   . Hypertension     stress test done    [facility] in [place]  . Myocardial infarction     ,2003,     . Anxiety   . Coronary artery disease     [facility]  . 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 Hyperlipemia   . Urination, excessive at night     Past Surgical History  Procedure Date  . Coronary angioplasty     2003      [facility]  . Joint replacement left total knee 2005    both shoulders  . Posterior cervical fusion/foraminotomy 07/25/2011    Procedure: POSTERIOR CERVICAL FUSION/FORAMINOTOMY LEVEL 1;  Surgeon: Karn Cassis;  Location: [facility];  Service: Neurosurgery;  Laterality: N/A;  Cervical Three-Four Laminectomy, Lateral Mass screws  . Rotator cuff repair bilateral  . Back surgery     History reviewed. No pertinent family history. Social History:  reports that he quit smoking about 26 years ago. His smoking use included Cigarettes. He quit after 30 years of use. He does not have any smokeless tobacco history on file. He reports that he drinks alcohol. He reports that he does not use illicit drugs.  Allergies:  Allergies  Allergen Reactions  . Neurontin (Gabapentin) Other (See Comments)    Swelling when taking everyday    Medications Prior to Admission  Medication Sig Dispense Refill  . amitriptyline (ELAVIL) 10 MG tablet Take 1 tablet (10 mg total) by mouth at bedtime.  30 tablet  1  . amLODipine (NORVASC) 10 MG tablet Take 10 mg by mouth daily.        Marland Kitchen aspirin EC 81 MG tablet Take 81 mg by mouth daily.         . furosemide (LASIX) 40 MG tablet Take 1 tablet (40 mg total) by mouth daily.  30 tablet  1  . glipiZIDE (GLUCOTROL) 10 MG tablet Take 10 mg by mouth 2 (two) times daily before a meal.       . insulin glargine (LANTUS) 100 UNIT/ML injection Inject 24 Units into the skin at bedtime.  10 mL  1  . metFORMIN (GLUCOPHAGE) 1000 MG tablet Take 1,000 mg by mouth 2 (two) times daily with a meal.        . metoprolol succinate (TOPROL-XL) 25 MG 24 hr tablet Take 25 mg by mouth daily.        . Multiple Vitamin (MULTIVITAMIN) tablet Take 1 tablet by mouth daily.        . Omega-3 Fatty Acids (FISH OIL) 1200 MG CAPS Take 1 capsule by mouth daily.        . potassium chloride SA (K-DUR,KLOR-CON) 20 MEQ tablet Take 1 tablet (20 mEq total) by mouth daily.  30 tablet  1  . quinapril (ACCUPRIL) 40 MG tablet Take 40 mg by mouth daily.        . diclofenac sodium (VOLTAREN) 1 % GEL Apply 1 application topically 3 (three) times daily as needed. For pain      .  gabapentin (NEURONTIN) 600 MG tablet Take 600 mg by mouth Three times daily as needed. For nerve pain        Results for orders placed during the hospital encounter of 04/07/12 (from the past 48 hour(s))  GLUCOSE, CAPILLARY     Status: Abnormal   Collection Time   04/07/12 10:58 AM      Component Value Range Comment   Glucose-Capillary 149 (*) 70 - 99 mg/dL   BASIC METABOLIC PANEL     Status: Abnormal   Collection Time   04/07/12 11:17 AM      Component Value Range Comment   Sodium 140  135 - 145 mEq/L    Potassium 5.1  3.5 - 5.1 mEq/L    Chloride 108  96 - 112 mEq/L    CO2 23  19 - 32 mEq/L    Glucose, Bld 177 (*) 70 - 99 mg/dL    BUN 33 (*) 6 - 23 mg/dL    Creatinine, Ser 9.60  0.50 - 1.35 mg/dL    Calcium 9.4  8.4 - 45.4 mg/dL    GFR calc non Af Amer 57 (*) >90 mL/min    GFR calc Af Amer 65 (*) >90 mL/min    No results found.  Review of Systems  Constitutional: Negative.   HENT: Positive for neck pain.   Eyes: Negative.   Respiratory:  Negative.   Cardiovascular: Negative.   Gastrointestinal: Negative.   Genitourinary: Negative.   Musculoskeletal: Positive for back pain.  Skin: Negative.   Neurological: Positive for sensory change and focal weakness.  Endo/Heme/Allergies: Negative.   Psychiatric/Behavioral: Negative.     Blood pressure 135/83, pulse 69, temperature 97.7 F (36.5 C), temperature source Oral, resp. rate 18, SpO2 98.00%. Physical Exam hetn,nl. Neck, cervical scars with decrease of mobility. Lungs, mild rales. Cv, nl. Abdomen,nl. Extremities, grade 1 edema. NEURO weakness proximal both legs. Sensory no. Decrease of lumbar flexibility. myelogram showed severe stenosis a tthoracic 9-10 also moderate stenosis at lumbar 23-34-45  Assessment/Plan Decompressive laminectomies at thoracic level. He and wife aware of risks and benefits. Lumbar stenosis to be f/u.  Tansy Lorek M 04/07/2012, 1:51 PM

## 2012-04-07 NOTE — Anesthesia Preprocedure Evaluation (Addendum)
Anesthesia Evaluation  Patient identified by MRN, date of birth, ID band Patient awake    Reviewed: Allergy & Precautions, H&P , NPO status   Airway Mallampati: II TM Distance: >3 FB Neck ROM: Full    Dental  (+) Teeth Intact, Partial Upper and Dental Advisory Given   Pulmonary former smoker,  breath sounds clear to auscultation        Cardiovascular hypertension, Pt. on medications and Pt. on home beta blockers + CAD and + Past MI Rhythm:Regular Rate:Normal     Neuro/Psych Anxiety  Neuromuscular disease    GI/Hepatic   Endo/Other  Poorly Controlled, Type 2, Oral Hypoglycemic Agents  Renal/GU Renal InsufficiencyRenal disease     Musculoskeletal   Abdominal   Peds  Hematology   Anesthesia Other Findings   Reproductive/Obstetrics                         Anesthesia Physical Anesthesia Plan  ASA: III  Anesthesia Plan: General   Post-op Pain Management:    Induction: Intravenous  Airway Management Planned: Oral ETT  Additional Equipment:   Intra-op Plan:   Post-operative Plan: Extubation in OR  Informed Consent: I have reviewed the patients History and Physical, chart, labs and discussed the procedure including the risks, benefits and alternatives for the proposed anesthesia with the patient or authorized representative who has indicated his/her understanding and acceptance.   Dental advisory given  Plan Discussed with: CRNA, Anesthesiologist and Surgeon  Anesthesia Plan Comments:         Anesthesia Quick Evaluation

## 2012-04-07 NOTE — Anesthesia Postprocedure Evaluation (Signed)
  Anesthesia Post-op Note  Patient: Christopher [name]  Procedure(s) Performed: Procedure(s) (LRB): THORACIC DISCECTOMY (N/A)  Patient Location: PACU  Anesthesia Type: General  Level of Consciousness: awake, alert  and oriented  Airway and Oxygen Therapy: Patient Spontanous Breathing and Patient connected to nasal cannula oxygen  Post-op Pain: mild  Post-op Assessment: Post-op Vital signs reviewed  Post-op Vital Signs: Reviewed  Complications: No apparent anesthesia complications

## 2012-04-07 NOTE — Addendum Note (Signed)
Addendum  created 04/07/12 1649 by Hershel Corkery A Taralyn Ferraiolo, MD   Modules edited:Anesthesia Attestations    

## 2012-04-08 LAB — GLUCOSE, CAPILLARY
Glucose-Capillary: 201 mg/dL — ABNORMAL HIGH (ref 70–99)
Glucose-Capillary: 264 mg/dL — ABNORMAL HIGH (ref 70–99)
Glucose-Capillary: 233 mg/dL — ABNORMAL HIGH (ref 70–99)

## 2012-04-08 NOTE — Evaluation (Signed)
Physical Therapy Evaluation Patient Details Name: Christopher [name] MRN: [id number] DOB: 03/29/1941 Today's Date: 04/08/2012 Time: 4696-2952 PT Time Calculation (min): 22 min  PT Assessment / Plan / Recommendation Clinical Impression  Pt admitted for discectomy due to T9-10 stenosis. Pt states the bil groin pain he had PTA is no longer present and just a little sore at surgical site. Pt mobilizing very well and very motivated to return to daily activities. Will follow acutely to maximize independence but do not feel pt will need further therapy at home.     PT Assessment  Patient needs continued PT services    Follow Up Recommendations  No PT follow up    Barriers to Discharge None      Equipment Recommendations  None recommended by PT    Recommendations for Other Services     Frequency Min 5X/week    Precautions / Restrictions Precautions Precautions: Back Precaution Booklet Issued: Yes (comment)   Pertinent Vitals/Pain Surgical site soreness      Mobility  Bed Mobility Bed Mobility: Rolling Left;Left Sidelying to Sit;Sit to Sidelying Left Rolling Left: 5: Supervision Left Sidelying to Sit: 5: Supervision;HOB flat Sit to Sidelying Left: 5: Supervision;HOB flat Details for Bed Mobility Assistance: Pt able to perform all mobility with cues for specifics for technique and precautions only Transfers Transfers: Sit to Stand;Stand to Sit Sit to Stand: 6: Modified independent (Device/Increase time);From chair/3-in-1 Stand to Sit: 6: Modified independent (Device/Increase time);To chair/3-in-1;To toilet Details for Transfer Assistance: x 2 trials Ambulation/Gait Ambulation/Gait Assistance: 6: Modified independent (Device/Increase time) Ambulation Distance (Feet): 1000 Feet Assistive device: Rolling walker Ambulation/Gait Assistance Details: decreased speed with initial cueing to step into RW and then pt able to maintain correct posture throughout. Increased stride with RW compared  to no AD Gait Pattern: Step-through pattern;Decreased stride length Stairs: No    Exercises     PT Diagnosis: Difficulty walking  PT Problem List: Decreased activity tolerance;Decreased knowledge of precautions;Decreased knowledge of use of DME PT Treatment Interventions: Gait training;DME instruction;Stair training;Functional mobility training;Therapeutic activities;Patient/family education   PT Goals Acute Rehab PT Goals PT Goal Formulation: With patient Time For Goal Achievement: 04/15/12 Potential to Achieve Goals: Good Pt will Roll Supine to Left Side: with modified independence PT Goal: Rolling Supine to Left Side - Progress: Goal set today Pt will go Supine/Side to Sit: with modified independence PT Goal: Supine/Side to Sit - Progress: Goal set today Pt will Ambulate: with modified independence;with least restrictive assistive device;>150 feet PT Goal: Ambulate - Progress: Goal set today Pt will Go Up / Down Stairs: 1-2 stairs;with modified independence;with least restrictive assistive device PT Goal: Up/Down Stairs - Progress: Goal set today Additional Goals Additional Goal #1: Pt will independently state and demonstrate adherence to back precautions PT Goal: Additional Goal #1 - Progress: Goal set today  Visit Information  Last PT Received On: 04/08/12 Assistance Needed: +1    Subjective Data  Subjective: I was just in a fishing tournament last weekend.  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 Communication Communication: No difficulties    Cognition  Overall Cognitive Status: Appears within functional limits for tasks assessed/performed Arousal/Alertness: Awake/alert Orientation Level: Appears intact for tasks assessed Behavior During Session: [facility] for tasks performed    Extremity/Trunk Assessment Right Upper Extremity Assessment RUE ROM/Strength/Tone: [facility] for tasks assessed Left Upper Extremity Assessment LUE ROM/Strength/Tone: [facility] for tasks assessed Right Lower Extremity Assessment RLE ROM/Strength/Tone: [facility] for tasks assessed Left Lower Extremity Assessment LLE ROM/Strength/Tone: [facility] for tasks assessed   Balance    End of Session PT - End of Session Activity Tolerance: Patient tolerated treatment well Patient left: Other (comment);with call bell/phone within reach (in bathroom)  GP     Toney Sang Beth 04/08/2012, 10:21 AM  Delaney Meigs, PT [phone]

## 2012-04-08 NOTE — Op Note (Signed)
NAMETORRANCE, STOCKLEY                 ACCOUNT NO.:  [id number]  MEDICAL RECORD NO.:  [id number]  LOCATION:  [id number]                        FACILITY:  [facility]  PHYSICIAN:  Hilda Lias, M.D.   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  Lumbar stenosis.  Status post anterior posterior cervical fusion.  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  Lumbar stenosis.  Status post anterior and posterior cervical fusion.  PROCEDURE:  Bilateral 9-10 laminectomy, foraminotomy, decompression of the spinal cord.  Microscope.  SURGEON:  Hilda Lias, M.D.  ASSISTANT:  Coletta Memos, M.D.  CLINICAL HISTORY:  Mr. Llamas is a gentleman who in the past underwent cervical fusion.  Lately he had been complaining of mid thoracic pain with radiation into the groin.  On top of that, he has some discomfort with pain down to both legs.  Myelogram showed severe stenosis at the level of thoracic 9-10 with stenosis at the level of 4-5, 5-1.  Surgery was advised.  The patient failed with conservative treatment including thoracic epidural injection.  PROCEDURE:  The patient was taken to the OR and after intubation, he was positioned in a prone manner.  The back was cleaned with DuraPrep.  We did 2 x-ray which showed that we were at the level of T12-L1.  Then, midline incision from the upper part of the needle was made through the skin, subcutaneous tissue straight down to the cervical spine.  Two x- rays were taken and the last one showed that indeed we were at the level of 9-10.  From then on, with the Le[place], we removed the spinal process of 9 and 10.  We brought the microscope into the area.  We drilled the lamina of 9-10 bilaterally.  The patient had 2  major findings, calcified ligament and quite a bit of overgrowth of the facet.  Decompression of the spinal cord as well as foraminotomy was achieved.  At the end, we had plenty of room above and below.  Then hemostasis was done with bipolar.  The area was irrigated.  A large Hemovac was placed in the epidural space and the wound was closed with Vicryl and Steri-Strips.          ______________________________ Hilda Lias, M.D.     EB/MEDQ  D:  04/07/2012  T:  04/08/2012  Job:  [id number]

## 2012-04-08 NOTE — Progress Notes (Signed)
Patient ID: Christopher [name], male   DOB: 1941-03-09, 71 y.o.   MRN: [id number] Oob, no weakness. Minimal drainage. Pt to see him. Dc in am

## 2012-04-08 NOTE — Progress Notes (Signed)
UR COMPLETED  

## 2012-04-08 NOTE — Progress Notes (Signed)
Inpatient Diabetes Program Recommendations  AACE/ADA: New Consensus Statement on Inpatient Glycemic Control (2013)  Target Ranges:  Prepandial:   less than 140 mg/dL      Peak postprandial:   less than 180 mg/dL (1-2 hours)      Critically ill patients:  140 - 180 mg/dL   Results for Christopher [name], Christopher [name] (MRN [id number]) as of 04/08/2012 16:22  Ref. Range 04/08/2012 06:46 04/08/2012 11:23  Glucose-Capillary Latest Range: 70-99 mg/dL 409 (H) 811 (H)    Inpatient Diabetes Program Recommendations Insulin - Basal: Please start patient's home dose Lantus- 24 units QHS.  Note: Will follow. Ambrose Finland RN, MSN, CDE Diabetes Coordinator Inpatient Diabetes Program [phone]

## 2012-04-08 NOTE — Progress Notes (Signed)
Occupational Therapy Evaluation Patient Details Name: Christopher [name] MRN: [id number] DOB: 1940-09-05 Today's Date: 04/08/2012 Time: 4132-4401 OT Time Calculation (min): 12 min  OT Assessment / Plan / Recommendation Clinical Impression  Pt admitted for discectomy due to T9-10 stenosis. Pt is at mod I level for BADLs.  All education completed. No further acute OT needs.    OT Assessment  Patient does not need any further OT services    Follow Up Recommendations  No OT follow up;Supervision - Intermittent    Barriers to Discharge      Equipment Recommendations  None recommended by OT    Recommendations for Other Services    Frequency       Precautions / Restrictions Precautions Precautions: Back Precaution Booklet Issued: Yes (comment)   Pertinent Vitals/Pain See vitals    ADL  Lower Body Bathing: Simulated;Modified independent Where Assessed - Lower Body Bathing: Unsupported sitting Lower Body Dressing: Performed;Modified independent Where Assessed - Lower Body Dressing: Unsupported sitting Toilet Transfer: Simulated;Modified independent Toilet Transfer Method: Sit to stand Transfers/Ambulation Related to ADLs: mod I for increased time ADL Comments: Pt able to cross ankles over knees to don/doff bil socks.  Reommended to pt that he use shower chair while bathing and also cross LEs to access feet when bathing.  Pt verbalized good safety awareness and proper body mechanics for ADLs.    OT Diagnosis:    OT Problem List:   OT Treatment Interventions:     OT Goals    Visit Information  Last OT Received On: 04/08/12    Subjective Data      Prior Functioning  Vision/Perception  Home Living Lives With: Spouse Available Help at Discharge: Family;Available 24 hours/day Type of Home: House Home Access: Stairs to enter [employer] of Steps: 1 Entrance Stairs-Rails: None Home Layout: One level Bathroom Shower/Tub: Health visitor:  Standard Home Adaptive Equipment: Bedside commode/3-in-1;Walker - rolling;Straight cane;Other (comment) (pt created a shower seat) Prior Function Level of Independence: Independent Able to Take Stairs?: Yes Driving: Yes Vocation: Retired Musician: No difficulties      Cognition  Overall Cognitive Status: Appears within functional limits for tasks assessed/performed Arousal/Alertness: Awake/alert Orientation Level: Appears intact for tasks assessed Behavior During Session: [facility] for tasks performed    Extremity/Trunk Assessment Right Upper Extremity Assessment RUE ROM/Strength/Tone: Within functional levels Left Upper Extremity Assessment LUE ROM/Strength/Tone: Within functional levels   Mobility  Shoulder Instructions  Transfers Sit to Stand: 6: Modified independent (Device/Increase time);From chair/3-in-1;With armrests;With upper extremity assist Stand to Sit: 6: Modified independent (Device/Increase time);To chair/3-in-1;With armrests;With upper extremity assist       Exercise     Balance     End of Session OT - End of Session Activity Tolerance: Patient tolerated treatment well Patient left: in chair;with call bell/phone within reach Nurse Communication: Mobility status  GO    04/08/2012 Cipriano Mile OTR/L Pager [phone] Office [phone]  Cipriano Mile 04/08/2012, 3:39 PM

## 2012-04-08 NOTE — Clinical Social Work Note (Signed)
CSW received consult for SNF. CSW reviewed chart and discussed pt with RN during progression. PT is not recommending follow up. Will alert RNCM. CSW is signing off as no further needs identified. Please reconsult if a need arises prior to discharge.   Dede Query, MSW, Theresia Majors [phone]

## 2012-04-09 LAB — GLUCOSE, CAPILLARY

## 2012-04-09 NOTE — Discharge Summary (Signed)
Physician Discharge Summary  Patient ID: Christopher [name] MRN: [id number] DOB/AGE: 1940-12-14 71 y.o.  Admit date: 04/07/2012 Discharge date: 04/09/2012  Admission Diagnoses thoraccic stenosis  Discharge Diagnoses: same   Discharged Condition: no weakness  Hospital Course: surgery  Consults: no  Significant Diagnostic Studies: myelogram  Treatments: surgery  Discharge Exam: Blood pressure 128/80, pulse 79, temperature 97.4 F (36.3 C), temperature source Oral, resp. rate 18, height 5\' 10"  (1.778 m), weight 95.255 kg (210 lb), SpO2 97.00%. Wound dry. No weakness  Disposition: home   Medication List  As of 04/09/2012  1:27 PM   ASK your doctor about these medications         amitriptyline 10 MG tablet   Commonly known as: ELAVIL   Take 1 tablet (10 mg total) by mouth at bedtime.      amLODipine 10 MG tablet   Commonly known as: NORVASC   Take 10 mg by mouth daily.      aspirin EC 81 MG tablet   Take 81 mg by mouth daily.      diclofenac sodium 1 % Gel   Commonly known as: VOLTAREN   Apply 1 application topically 3 (three) times daily as needed. For pain      Fish Oil 1200 MG Caps   Take 1 capsule by mouth daily.      furosemide 40 MG tablet   Commonly known as: LASIX   Take 1 tablet (40 mg total) by mouth daily.      gabapentin 600 MG tablet   Commonly known as: NEURONTIN   Take 600 mg by mouth Three times daily as needed. For nerve pain      glipiZIDE 10 MG tablet   Commonly known as: GLUCOTROL   Take 10 mg by mouth 2 (two) times daily before a meal.      insulin glargine 100 UNIT/ML injection   Commonly known as: LANTUS   Inject 24 Units into the skin at bedtime.      metFORMIN 1000 MG tablet   Commonly known as: GLUCOPHAGE   Take 1,000 mg by mouth 2 (two) times daily with a meal.      metoprolol succinate 25 MG 24 hr tablet   Commonly known as: TOPROL-XL   Take 25 mg by mouth daily.      multivitamin tablet   Take 1 tablet by mouth daily.     potassium chloride SA 20 MEQ tablet   Commonly known as: K-DUR,KLOR-CON   Take 1 tablet (20 mEq total) by mouth daily.      quinapril 40 MG tablet   Commonly known as: ACCUPRIL   Take 40 mg by mouth daily.             Signed: Karn Cassis 04/09/2012, 1:27 PM

## 2012-04-09 NOTE — Progress Notes (Signed)
Patient D/C instructions and education given to patient and family. All questions answered to patient's satisfaction. Pt D/C home with no signs of acute distress.  

## 2012-04-09 NOTE — Progress Notes (Signed)
Physical Therapy Treatment Patient Details Name: Christopher [name] MRN: [id number] DOB: May 24, 1941 Today's Date: 04/09/2012 Time: 1191-4782 PT Time Calculation (min): 16 min  PT Assessment / Plan / Recommendation Comments on Treatment Session  Patient progressing great this session with all aspects of mobility. Patient plans on DC today. Is safe to DC from a PT standpoint    Follow Up Recommendations  No PT follow up    Barriers to Discharge        Equipment Recommendations  None recommended by PT;None recommended by OT    Recommendations for Other Services    Frequency Min 5X/week   Plan Discharge plan remains appropriate;Frequency remains appropriate    Precautions / Restrictions Precautions Precautions: Back Precaution Comments: Patient able to recall all precautions and positioning techniques   Pertinent Vitals/Pain 3/10 back pain    Mobility  Transfers Sit to Stand: 6: Modified independent (Device/Increase time) Stand to Sit: 6: Modified independent (Device/Increase time) Ambulation/Gait Ambulation/Gait Assistance: 6: Modified independent (Device/Increase time) Assistive device: Rolling walker;None Ambulation/Gait Assistance Details: Patient able to ambulate Mod I with and without RW. Recommend RW if out for longer distances due to possible fatique Gait Pattern: Step-through pattern Stairs: Yes Stairs Assistance: 6: Modified independent (Device/Increase time) Stair Management Technique: Two rails Number of Stairs: 5     Exercises     PT Diagnosis:    PT Problem List:   PT Treatment Interventions:     PT Goals Acute Rehab PT Goals PT Goal: Ambulate - Progress: Met PT Goal: Up/Down Stairs - Progress: Met Additional Goals PT Goal: Additional Goal #1 - Progress: Met  Visit Information  Last PT Received On: 04/09/12 Assistance Needed: +1    Subjective Data      Cognition  Overall Cognitive Status: Appears within functional limits for tasks  assessed/performed Arousal/Alertness: Awake/alert Orientation Level: Appears intact for tasks assessed Behavior During Session: [facility] for tasks performed    Balance     End of Session PT - End of Session Equipment Utilized During Treatment: Gait belt Activity Tolerance: Patient tolerated treatment well Patient left: in chair;with call bell/phone within reach Nurse Communication: Mobility status   GP     Fredrich Birks 04/09/2012, 8:25 AM 04/09/2012 Fredrich Birks PTA [phone] pager [phone] office

## 2012-04-12 LAB — GLUCOSE, CAPILLARY: Glucose-Capillary: 81 mg/dL (ref 70–99)

## 2016-04-21 ENCOUNTER — Encounter: Payer: Self-pay | Admitting: Internal Medicine

## 2017-05-07 ENCOUNTER — Other Ambulatory Visit: Payer: Self-pay | Admitting: Neurosurgery

## 2017-05-07 DIAGNOSIS — M5416 Radiculopathy, lumbar region: Secondary | ICD-10-CM

## 2017-05-21 ENCOUNTER — Ambulatory Visit
Admission: RE | Admit: 2017-05-21 | Discharge: 2017-05-21 | Disposition: A | Discharge status: Home / Self Care | Payer: Medicare HMO | Source: Ambulatory Visit | Attending: Neurosurgery | Admitting: Neurosurgery

## 2017-05-21 DIAGNOSIS — M5416 Radiculopathy, lumbar region: Secondary | ICD-10-CM

## 2017-06-20 ENCOUNTER — Other Ambulatory Visit: Payer: Self-pay

## 2017-06-20 ENCOUNTER — Emergency Department: Payer: Medicare HMO

## 2017-06-20 ENCOUNTER — Inpatient Hospital Stay
Admission: EM | Admit: 2017-06-20 | Discharge: 2017-07-07 | DRG: 286 | Disposition: A | Discharge status: Skilled Nursing Facility | Payer: Medicare HMO | Attending: Internal Medicine | Admitting: Internal Medicine

## 2017-06-20 DIAGNOSIS — M25461 Effusion, right knee: Secondary | ICD-10-CM | POA: Diagnosis present

## 2017-06-20 DIAGNOSIS — Z888 Allergy status to other drugs, medicaments and biological substances status: Secondary | ICD-10-CM

## 2017-06-20 DIAGNOSIS — I2510 Atherosclerotic heart disease of native coronary artery without angina pectoris: Secondary | ICD-10-CM | POA: Diagnosis present

## 2017-06-20 DIAGNOSIS — J9690 Respiratory failure, unspecified, unspecified whether with hypoxia or hypercapnia: Secondary | ICD-10-CM

## 2017-06-20 DIAGNOSIS — Z7982 Long term (current) use of aspirin: Secondary | ICD-10-CM

## 2017-06-20 DIAGNOSIS — M1711 Unilateral primary osteoarthritis, right knee: Secondary | ICD-10-CM | POA: Diagnosis present

## 2017-06-20 DIAGNOSIS — E1142 Type 2 diabetes mellitus with diabetic polyneuropathy: Secondary | ICD-10-CM | POA: Diagnosis present

## 2017-06-20 DIAGNOSIS — N186 End stage renal disease: Secondary | ICD-10-CM | POA: Diagnosis present

## 2017-06-20 DIAGNOSIS — G8929 Other chronic pain: Secondary | ICD-10-CM | POA: Diagnosis present

## 2017-06-20 DIAGNOSIS — R0602 Shortness of breath: Secondary | ICD-10-CM

## 2017-06-20 DIAGNOSIS — E669 Obesity, unspecified: Secondary | ICD-10-CM | POA: Diagnosis present

## 2017-06-20 DIAGNOSIS — E785 Hyperlipidemia, unspecified: Secondary | ICD-10-CM | POA: Diagnosis present

## 2017-06-20 DIAGNOSIS — I132 Hypertensive heart and chronic kidney disease with heart failure and with stage 5 chronic kidney disease, or end stage renal disease: Secondary | ICD-10-CM | POA: Diagnosis present

## 2017-06-20 DIAGNOSIS — G9349 Other encephalopathy: Secondary | ICD-10-CM | POA: Diagnosis present

## 2017-06-20 DIAGNOSIS — N184 Chronic kidney disease, stage 4 (severe): Secondary | ICD-10-CM | POA: Diagnosis not present

## 2017-06-20 DIAGNOSIS — E875 Hyperkalemia: Secondary | ICD-10-CM | POA: Diagnosis present

## 2017-06-20 DIAGNOSIS — J9811 Atelectasis: Secondary | ICD-10-CM | POA: Diagnosis not present

## 2017-06-20 DIAGNOSIS — M549 Dorsalgia, unspecified: Secondary | ICD-10-CM | POA: Diagnosis present

## 2017-06-20 DIAGNOSIS — I5033 Acute on chronic diastolic (congestive) heart failure: Secondary | ICD-10-CM | POA: Diagnosis present

## 2017-06-20 DIAGNOSIS — N2581 Secondary hyperparathyroidism of renal origin: Secondary | ICD-10-CM | POA: Diagnosis present

## 2017-06-20 DIAGNOSIS — R1310 Dysphagia, unspecified: Secondary | ICD-10-CM | POA: Diagnosis not present

## 2017-06-20 DIAGNOSIS — J9622 Acute and chronic respiratory failure with hypercapnia: Secondary | ICD-10-CM | POA: Diagnosis not present

## 2017-06-20 DIAGNOSIS — E1165 Type 2 diabetes mellitus with hyperglycemia: Secondary | ICD-10-CM | POA: Diagnosis present

## 2017-06-20 DIAGNOSIS — Z96659 Presence of unspecified artificial knee joint: Secondary | ICD-10-CM | POA: Diagnosis present

## 2017-06-20 DIAGNOSIS — E1122 Type 2 diabetes mellitus with diabetic chronic kidney disease: Secondary | ICD-10-CM | POA: Diagnosis present

## 2017-06-20 DIAGNOSIS — N170 Acute kidney failure with tubular necrosis: Secondary | ICD-10-CM | POA: Diagnosis not present

## 2017-06-20 DIAGNOSIS — F419 Anxiety disorder, unspecified: Secondary | ICD-10-CM | POA: Diagnosis present

## 2017-06-20 DIAGNOSIS — E119 Type 2 diabetes mellitus without complications: Secondary | ICD-10-CM | POA: Diagnosis not present

## 2017-06-20 DIAGNOSIS — J9602 Acute respiratory failure with hypercapnia: Secondary | ICD-10-CM | POA: Diagnosis present

## 2017-06-20 DIAGNOSIS — D696 Thrombocytopenia, unspecified: Secondary | ICD-10-CM | POA: Diagnosis not present

## 2017-06-20 DIAGNOSIS — N171 Acute kidney failure with acute cortical necrosis: Secondary | ICD-10-CM | POA: Diagnosis not present

## 2017-06-20 DIAGNOSIS — Z905 Acquired absence of kidney: Secondary | ICD-10-CM

## 2017-06-20 DIAGNOSIS — M7121 Synovial cyst of popliteal space [Baker], right knee: Secondary | ICD-10-CM | POA: Diagnosis present

## 2017-06-20 DIAGNOSIS — Z992 Dependence on renal dialysis: Secondary | ICD-10-CM

## 2017-06-20 DIAGNOSIS — J9601 Acute respiratory failure with hypoxia: Secondary | ICD-10-CM | POA: Diagnosis present

## 2017-06-20 DIAGNOSIS — Z9861 Coronary angioplasty status: Secondary | ICD-10-CM

## 2017-06-20 DIAGNOSIS — D631 Anemia in chronic kidney disease: Secondary | ICD-10-CM | POA: Diagnosis present

## 2017-06-20 DIAGNOSIS — Z6835 Body mass index (BMI) 35.0-35.9, adult: Secondary | ICD-10-CM

## 2017-06-20 DIAGNOSIS — J9600 Acute respiratory failure, unspecified whether with hypoxia or hypercapnia: Secondary | ICD-10-CM

## 2017-06-20 DIAGNOSIS — N179 Acute kidney failure, unspecified: Secondary | ICD-10-CM | POA: Diagnosis present

## 2017-06-20 DIAGNOSIS — J9621 Acute and chronic respiratory failure with hypoxia: Secondary | ICD-10-CM | POA: Diagnosis not present

## 2017-06-20 DIAGNOSIS — I252 Old myocardial infarction: Secondary | ICD-10-CM

## 2017-06-20 DIAGNOSIS — E118 Type 2 diabetes mellitus with unspecified complications: Secondary | ICD-10-CM | POA: Diagnosis not present

## 2017-06-20 DIAGNOSIS — M109 Gout, unspecified: Secondary | ICD-10-CM | POA: Diagnosis present

## 2017-06-20 DIAGNOSIS — I959 Hypotension, unspecified: Secondary | ICD-10-CM | POA: Diagnosis present

## 2017-06-20 DIAGNOSIS — Z794 Long term (current) use of insulin: Secondary | ICD-10-CM

## 2017-06-20 DIAGNOSIS — R0902 Hypoxemia: Secondary | ICD-10-CM

## 2017-06-20 DIAGNOSIS — Z79899 Other long term (current) drug therapy: Secondary | ICD-10-CM

## 2017-06-20 DIAGNOSIS — I10 Essential (primary) hypertension: Secondary | ICD-10-CM | POA: Diagnosis not present

## 2017-06-20 DIAGNOSIS — N189 Chronic kidney disease, unspecified: Secondary | ICD-10-CM | POA: Diagnosis not present

## 2017-06-20 DIAGNOSIS — E8770 Fluid overload, unspecified: Secondary | ICD-10-CM | POA: Diagnosis not present

## 2017-06-20 DIAGNOSIS — Z87891 Personal history of nicotine dependence: Secondary | ICD-10-CM

## 2017-06-20 DIAGNOSIS — M25561 Pain in right knee: Secondary | ICD-10-CM

## 2017-06-20 DIAGNOSIS — Z4659 Encounter for fitting and adjustment of other gastrointestinal appliance and device: Secondary | ICD-10-CM

## 2017-06-20 DIAGNOSIS — Z87442 Personal history of urinary calculi: Secondary | ICD-10-CM

## 2017-06-20 DIAGNOSIS — R609 Edema, unspecified: Secondary | ICD-10-CM

## 2017-06-20 LAB — COMPREHENSIVE METABOLIC PANEL
ALBUMIN: 3.6 g/dL (ref 3.5–5.0)
ALT: 19 U/L (ref 17–63)
AST: 21 U/L (ref 15–41)
Alkaline Phosphatase: 86 U/L (ref 38–126)
Anion gap: 12 (ref 5–15)
BUN: 156 mg/dL — ABNORMAL HIGH (ref 6–20)
CHLORIDE: 108 mmol/L (ref 101–111)
CO2: 19 mmol/L — AB (ref 22–32)
CREATININE: 5.66 mg/dL — AB (ref 0.61–1.24)
Calcium: 7.7 mg/dL — ABNORMAL LOW (ref 8.9–10.3)
GFR calc non Af Amer: 9 mL/min — ABNORMAL LOW (ref >60)
GFR, EST AFRICAN AMERICAN: 10 mL/min — AB (ref >60)
Glucose, Bld: 344 mg/dL — ABNORMAL HIGH (ref 65–99)
POTASSIUM: 5 mmol/L (ref 3.5–5.1)
Sodium: 139 mmol/L (ref 135–145)
Total Bilirubin: 0.5 mg/dL (ref 0.3–1.2)
Total Protein: 7 g/dL (ref 6.5–8.1)

## 2017-06-20 LAB — CBC WITH DIFFERENTIAL/PLATELET
BASOS PCT: 1 %
Basophils Absolute: 0.1 10*3/uL (ref 0–0.1)
Eosinophils Absolute: 0 10*3/uL (ref 0–0.7)
Eosinophils Relative: 1 %
HCT: 32.9 % — ABNORMAL LOW (ref 40.0–52.0)
HEMOGLOBIN: 10.4 g/dL — AB (ref 13.0–18.0)
LYMPHS ABS: 1 10*3/uL (ref 1.0–3.6)
Lymphocytes Relative: 13 %
MCH: 31.3 pg (ref 26.0–34.0)
MCHC: 31.6 g/dL — AB (ref 32.0–36.0)
MCV: 99.2 fL (ref 80.0–100.0)
MONO ABS: 0.9 10*3/uL (ref 0.2–1.0)
MONOS PCT: 12 %
NEUTROS ABS: 5.2 10*3/uL (ref 1.4–6.5)
NEUTROS PCT: 73 %
Platelets: 164 10*3/uL (ref 150–440)
RBC: 3.31 MIL/uL — ABNORMAL LOW (ref 4.40–5.90)
RDW: 15.1 % — AB (ref 11.5–14.5)
WBC: 7.1 10*3/uL (ref 3.8–10.6)

## 2017-06-20 LAB — GLUCOSE, CAPILLARY
Glucose-Capillary: 196 mg/dL — ABNORMAL HIGH (ref 65–99)
Glucose-Capillary: 249 mg/dL — ABNORMAL HIGH (ref 65–99)

## 2017-06-20 LAB — BRAIN NATRIURETIC PEPTIDE: B NATRIURETIC PEPTIDE 5: 404 pg/mL — AB (ref 0.0–100.0)

## 2017-06-20 LAB — TROPONIN I: Troponin I: <0.03 ng/mL (ref <0.03)

## 2017-06-20 LAB — TSH: TSH: 4.244 u[IU]/mL (ref 0.350–4.500)

## 2017-06-20 MED ORDER — ACETAMINOPHEN 325 MG PO TABS
650.0000 mg | ORAL_TABLET | Freq: Four times a day (QID) | ORAL | Status: DC | PRN
  Administered 2017-06-23: 650 mg via ORAL
  Filled 2017-06-20: qty 2

## 2017-06-20 MED ORDER — ACETAMINOPHEN 650 MG RE SUPP: 650.0000 mg | Freq: Four times a day (QID) | RECTAL | Status: DC | PRN

## 2017-06-20 MED ORDER — ADULT MULTIVITAMIN W/MINERALS CH
1.0000 | ORAL_TABLET | Freq: Every day | ORAL | Status: DC
  Administered 2017-06-21: 1 via ORAL
  Filled 2017-06-20 (×2): qty 1

## 2017-06-20 MED ORDER — POTASSIUM CHLORIDE CRYS ER 20 MEQ PO TBCR
20.0000 meq | EXTENDED_RELEASE_TABLET | Freq: Every day | ORAL | Status: DC
  Filled 2017-06-20: qty 1

## 2017-06-20 MED ORDER — ALLOPURINOL 100 MG PO TABS: 100.0000 mg | ORAL_TABLET | Freq: Every day | ORAL | Status: DC

## 2017-06-20 MED ORDER — INSULIN NPH (HUMAN) (ISOPHANE) 100 UNIT/ML ~~LOC~~ SUSP
5.0000 [IU] | Freq: Every day | SUBCUTANEOUS | Status: DC
  Administered 2017-06-20: 5 [IU] via SUBCUTANEOUS
  Filled 2017-06-20: qty 10

## 2017-06-20 MED ORDER — ONDANSETRON HCL 4 MG/2ML IJ SOLN
4.0000 mg | Freq: Four times a day (QID) | INTRAMUSCULAR | Status: DC | PRN
  Administered 2017-06-27: 4 mg via INTRAVENOUS
  Filled 2017-06-20: qty 2

## 2017-06-20 MED ORDER — AMLODIPINE BESYLATE 10 MG PO TABS
10.0000 mg | ORAL_TABLET | Freq: Every day | ORAL | Status: DC
  Administered 2017-06-21: 10 mg via ORAL
  Filled 2017-06-20 (×2): qty 1

## 2017-06-20 MED ORDER — FUROSEMIDE 10 MG/ML IJ SOLN
80.0000 mg | Freq: Once | INTRAMUSCULAR | Status: AC
  Administered 2017-06-20: 80 mg via INTRAVENOUS
  Filled 2017-06-20: qty 8

## 2017-06-20 MED ORDER — ONDANSETRON HCL 4 MG PO TABS: 4.0000 mg | ORAL_TABLET | Freq: Four times a day (QID) | ORAL | Status: DC | PRN

## 2017-06-20 MED ORDER — DOCUSATE SODIUM 100 MG PO CAPS
100.0000 mg | ORAL_CAPSULE | Freq: Two times a day (BID) | ORAL | Status: DC
  Administered 2017-06-20 – 2017-06-21 (×2): 100 mg via ORAL
  Filled 2017-06-20 (×2): qty 1

## 2017-06-20 MED ORDER — METOPROLOL SUCCINATE ER 50 MG PO TB24
25.0000 mg | ORAL_TABLET | Freq: Every day | ORAL | Status: DC
  Administered 2017-06-21: 25 mg via ORAL
  Filled 2017-06-20 (×2): qty 1

## 2017-06-20 MED ORDER — GABAPENTIN 600 MG PO TABS
600.0000 mg | ORAL_TABLET | Freq: Three times a day (TID) | ORAL | Status: DC | PRN
  Filled 2017-06-20: qty 1

## 2017-06-20 MED ORDER — INSULIN NPH (HUMAN) (ISOPHANE) 100 UNIT/ML ~~LOC~~ SUSP
10.0000 [IU] | Freq: Two times a day (BID) | SUBCUTANEOUS | Status: DC
  Filled 2017-06-20: qty 10

## 2017-06-20 MED ORDER — INSULIN NPH (HUMAN) (ISOPHANE) 100 UNIT/ML ~~LOC~~ SUSP
5.0000 [IU] | Freq: Every day | SUBCUTANEOUS | Status: DC
  Filled 2017-06-20 (×2): qty 10

## 2017-06-20 MED ORDER — INSULIN NPH (HUMAN) (ISOPHANE) 100 UNIT/ML ~~LOC~~ SUSP
10.0000 [IU] | Freq: Two times a day (BID) | SUBCUTANEOUS | Status: DC
  Filled 2017-06-20 (×2): qty 10

## 2017-06-20 MED ORDER — AMITRIPTYLINE HCL 10 MG PO TABS
10.0000 mg | ORAL_TABLET | Freq: Every day | ORAL | Status: DC
  Administered 2017-06-20 – 2017-06-22 (×3): 10 mg via ORAL
  Filled 2017-06-20 (×3): qty 1

## 2017-06-20 MED ORDER — ASPIRIN EC 81 MG PO TBEC
81.0000 mg | DELAYED_RELEASE_TABLET | Freq: Every day | ORAL | Status: DC
  Administered 2017-06-21: 81 mg via ORAL
  Filled 2017-06-20 (×2): qty 1

## 2017-06-20 MED ORDER — HEPARIN SODIUM (PORCINE) 5000 UNIT/ML IJ SOLN
5000.0000 [IU] | Freq: Three times a day (TID) | INTRAMUSCULAR | Status: DC
  Administered 2017-06-20 – 2017-06-24 (×10): 5000 [IU] via SUBCUTANEOUS
  Filled 2017-06-20 (×10): qty 1

## 2017-06-20 MED ORDER — FUROSEMIDE 10 MG/ML IJ SOLN: 100.0000 mg | Freq: Two times a day (BID) | INTRAVENOUS | Status: DC

## 2017-06-20 MED ORDER — OMEGA-3-ACID ETHYL ESTERS 1 G PO CAPS
1.0000 g | ORAL_CAPSULE | Freq: Two times a day (BID) | ORAL | Status: DC
  Administered 2017-06-20 – 2017-06-21 (×2): 1 g via ORAL
  Filled 2017-06-20 (×2): qty 1

## 2017-06-20 NOTE — ED Provider Notes (Signed)
[facility] Emergency Department Provider Note  Time seen: 2:49 PM  I have reviewed the triage vital signs and the nursing notes.   HISTORY  Chief Complaint Shortness of Breath    HPI Christopher [name] is a 76 y.o. male with a past medical history of anxiety, CKD, hypertension, hyperlipidemia, MI, peripheral edema, presents to the emergency department with shortness of breath.  According to the patient he was returning home from a fishing trip with his friends when he became acutely short of breath in the car.  They called EMS who met them on the side of the road.  EMS states room air saturation of 67% with a good waveform was placed on CPAP and brought to the emergency department for further evaluation.  Patient denies any chest pain.  Patient states increased lower extremity edema.  Continues to state shortness of breath although better on CPAP per patient.  Currently satting 88% on CPAP with 100% oxygen.   Past Medical History:  Diagnosis Date  . Anginal pain ([place])   . Anxiety   . Arthritis    "all over"  . Chronic back pain greater than 3 months duration   . 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 Coronary artery disease    [facility]  . Hyperlipemia   . Hypertension    stress test done    [facility] [place]  . Kidney stones   . Myocardial infarction ([place]) 2003  . 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 Pneumonia    "when I was a baby"  . Type II diabetes mellitus ([place])    "take a pill for it"  . Urination, excessive at night     Patient Active Problem List   Diagnosis Date Noted  . Myelopathy ([place]) 08/01/2011    Past Surgical History:  Procedure Laterality Date  . ANTERIOR FUSION CERVICAL SPINE  2005  . BACK SURGERY    . CORONARY ANGIOPLASTY     2003      [facility]  . INCISION AND DRAINAGE OF WOUND  ~ 2003   "stuck welding tip in my thump; left; got staph  infection in it"  . JOINT REPLACEMENT    . LUMBAR DISC SURGERY  1970's   "put serum made out of papaya fruit; took it off the market"  . PARTIAL NEPHRECTOMY  ~ 2000   left; "took tumor out"  . ROTATOR CUFF REPAIR  1980's   bilateral  . THORACIC DISCECTOMY  04/07/2012  . TOTAL KNEE ARTHROPLASTY  2005   left    Prior to Admission medications   Medication Sig Start Date End Date Taking? Authorizing Provider  amitriptyline (ELAVIL) 10 MG tablet Take 1 tablet (10 mg total) by mouth at bedtime. 08/07/11 08/06/12  Angiulli, Lavon Paganini, PA-C  amLODipine (NORVASC) 10 MG tablet Take 10 mg by mouth daily.      [provider]  aspirin EC 81 MG tablet Take 81 mg by mouth daily.      [provider]  diclofenac sodium (VOLTAREN) 1 % GEL Apply 1 application topically 3 (three) times daily as needed. For pain    [provider]  furosemide (LASIX) 40 MG tablet Take 1 tablet (40 mg total) by mouth daily. 08/07/11 08/06/12  Angiulli, Lavon Paganini, PA-C  gabapentin (NEURONTIN) 600 MG tablet Take 600 mg by mouth Three times daily as needed. For nerve pain  01/29/12   [provider]  glipiZIDE (GLUCOTROL) 10 MG tablet Take 10 mg by mouth 2 (two) times daily before a meal.     [provider]  metFORMIN (GLUCOPHAGE) 1000 MG tablet Take 1,000 mg by mouth 2 (two) times daily with a meal.      [provider]  metoprolol succinate (TOPROL-XL) 25 MG 24 hr tablet Take 25 mg by mouth daily.      [provider]  Multiple Vitamin (MULTIVITAMIN) tablet Take 1 tablet by mouth daily.      [provider]  Omega-3 Fatty Acids (FISH OIL) 1200 MG CAPS Take 1 capsule by mouth daily.      [provider]  potassium chloride SA (K-DUR,KLOR-CON) 20 MEQ tablet Take 1 tablet (20 mEq total) by mouth daily. 08/07/11 08/06/12  Angiulli, Lavon Paganini, PA-C  quinapril (ACCUPRIL) 40 MG tablet Take 40 mg by mouth daily.      [provider]    Allergies   Allergen Reactions  . Neurontin [Gabapentin] Other (See Comments)    Swelling when taking everyday    No family history on file.  Social History Social History   Tobacco Use  . Smoking status: Former Smoker    Packs/day: 2.00    Years: 30.00    Pack years: 60.00    Types: Cigarettes    Last attempt to quit: 07/17/1985    Years since quitting: 31.9  . Smokeless tobacco: Former Systems developer    Types: Chew  . Tobacco comment: 04/07/2012 "stopped chewing back in the 1980's"  Substance Use Topics  . Alcohol use: Yes    Comment: 04/07/2012 "'bout a beer a month if that"  . Drug use: No    Review of Systems Constitutional: Negative for fever. Cardiovascular: Negative for chest pain. Respiratory: Positive for shortness of breath Gastrointestinal: Active for abdominal pain. Musculoskeletal: Positive for lower extremity edema increased over baseline Skin: Negative for rash. Neurological: Negative for headache All other ROS negative  ____________________________________________   PHYSICAL EXAM:  VITAL SIGNS: ED Triage Vitals [06/20/17 1447]  Enc Vitals Group     BP      Pulse      Resp      Temp      Temp src      SpO2      Weight 220 lb (99.8 kg)     Height _0  (1.727 m)     Head Circumference      Peak Flow      Pain Score      Pain Loc      Pain Edu?      Excl. in [place]?    Constitutional: Alert and oriented.  No distress, lying in bed comfortably but is on CPAP. Eyes: Normal exam ENT   Head: Normocephalic and atraumatic.   Mouth/Throat: Mucous membranes are moist. Cardiovascular: Normal rate, regular rhythm. No murmur Respiratory: Moderate tachypnea, on CPAP, appears to have lung sounds bilaterally no obvious wheeze rales or rhonchi Gastrointestinal: Soft and nontender. No distention.  Obese. Musculoskeletal: Nontender with normal range of motion in all extremities.  3+ lower extremity pitting edema equal bilaterally Neurologic:  Normal speech and language.  No gross focal neurologic deficits. Skin:  Skin is warm, dry and intact.  Psychiatric: Mood and affect are normal.  ____________________________________________    EKG  EKG reviewed and interpreted by myself shows sinus tachycardia at 103 bpm with a narrow QRS, left axis deviation, largely normal intervals, nonspecific ST  changes.  No ST elevation.  ____________________________________________    RADIOLOGY  X-ray negative for edema or consolidation  ____________________________________________   INITIAL IMPRESSION / ASSESSMENT AND PLAN / ED COURSE  Pertinent labs & imaging results that were available during my care of the patient were reviewed by me and considered in my medical decision making (see chart for details).  Patient presents to the emergency department for shortness of breath.  Differential would include CHF exacerbation, ACS, COPD, pneumonia, pneumothorax, PE.  We will check labs, chest x-ray, EKG.  We will place the patient on BiPAP as he is currently satting 88% on CPAP.  Given the patient's significant lower extremity edema I suspect likely CHF exacerbation  I reviewed the patient's records including recent PCP and cardiology visit 05/14/17 showing a past history of CKD stage IV as well as CAD.  Labs have resulted showing acute renal insufficiency/failure with a creatinine of 5.6.  In care everywhere on chart review the patient has last creatinine of 3.6 2 months ago.  Chest x-ray is read as negative for edema.  Patient continues to do well on BiPAP will attempt to wean off of BiPAP.  Given the patient's acute renal failure, hypoxia and dyspnea we will admit to the hospital for further treatment we will dose Lasix.  ____________________________________________   FINAL CLINICAL IMPRESSION(S) / ED DIAGNOSES  Dyspnea Hypoxia Acute renal failure   Harvest Dark, MD 06/20/17 1549

## 2017-06-20 NOTE — ED Notes (Signed)
Patient on 50%fio2 venturi

## 2017-06-20 NOTE — ED Notes (Signed)
BIPAP D/C'd, 4L [place] applied. Will continue to monitor

## 2017-06-20 NOTE — ED Notes (Signed)
Ready bed found att, call for report initiated

## 2017-06-20 NOTE — H&P (Signed)
Christopher [name] is an 76 y.o. male.   Chief Complaint: Shortness of breath HPI: Patient with past medical history of diabetes, coronary artery disease status post myocardial infarction and chronic kidney disease presents to the emergency department via EMS with shortness of breath.  He reportedly had oxygen saturations as low as 67% on room air prior to the initiation of CPAP during transport.  The patient states that he has been coughing and becoming progressively more dyspneic over the last 2 days.  He denies fever, nausea, vomiting, chest pain or diaphoresis.  He admits to significant lower extremity swelling.  Initially the patient's work of breathing improved and he was weaned to facemask.  Once he is more stable the emergency department staff called the hospitalist service for admission.  Past Medical History:  Diagnosis Date  . Anginal pain ([place])   . Anxiety   . Arthritis    "all over"  . Chronic back pain greater than 3 months duration   . 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 Coronary artery disease    [facility]  . Hyperlipemia   . Hypertension    stress test done    [facility] [place]  . Kidney stones   . Myocardial infarction ([place]) 2003  . 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 Pneumonia    "when I was a baby"  . Type II diabetes mellitus ([place])    "take a pill for it"  . Urination, excessive at night     Past Surgical History:  Procedure Laterality Date  . ANTERIOR FUSION CERVICAL SPINE  2005  . BACK SURGERY    . CORONARY ANGIOPLASTY     2003      [facility]  . INCISION AND DRAINAGE OF WOUND  ~ 2003   "stuck welding tip in my thump; left; got staph infection in it"  . JOINT REPLACEMENT    . LUMBAR DISC SURGERY  1970's   "put serum made out of papaya fruit; took it off the market"  . PARTIAL NEPHRECTOMY  ~ 2000   left; "took tumor out"  . ROTATOR CUFF REPAIR  1980's   bilateral  .  THORACIC DISCECTOMY  04/07/2012  . TOTAL KNEE ARTHROPLASTY  2005   left    No family history on file. Social History:  reports that he quit smoking about 31 years ago. His smoking use included cigarettes. He has a 60.00 pack-year smoking history. He has quit using smokeless tobacco. His smokeless tobacco use included chew. He reports that he drinks alcohol. He reports that he does not use drugs.  Allergies:  Allergies  Allergen Reactions  . Neurontin [Gabapentin] Other (See Comments)    Swelling when taking everyday    Medications Prior to Admission  Medication Sig Dispense Refill  . allopurinol (ZYLOPRIM) 100 MG tablet Take 1 tablet daily by mouth.    Marland Kitchen amitriptyline (ELAVIL) 10 MG tablet Take 1 tablet (10 mg total) by mouth at bedtime. 30 tablet 1  . amLODipine (NORVASC) 10 MG tablet Take 10 mg by mouth daily.      Marland Kitchen aspirin EC 325 MG tablet Take 81 mg by mouth daily.      . furosemide (LASIX) 40 MG tablet Take 1 tablet (40 mg total) by mouth daily. 30 tablet 1  . insulin NPH Human (HUMULIN N,NOVOLIN N) 100 UNIT/ML injection Inject 10 Units 2 (two) times daily into the  skin.    . metoprolol succinate (TOPROL-XL) 100 MG 24 hr tablet Take 25 mg by mouth daily.      . Multiple Vitamin (MULTIVITAMIN) tablet Take 1 tablet by mouth daily.      . Omega-3 Fatty Acids (FISH OIL) 1200 MG CAPS Take 1 capsule by mouth daily.      . potassium chloride SA (K-DUR,KLOR-CON) 20 MEQ tablet Take 1 tablet (20 mEq total) by mouth daily. 30 tablet 1  . gabapentin (NEURONTIN) 600 MG tablet Take 600 mg by mouth Three times daily as needed. For nerve pain      Results for orders placed or performed during the hospital encounter of 06/20/17 (from the past 48 hour(s))  CBC with Differential     Status: Abnormal   Collection Time: 06/20/17  2:45 PM  Result Value Ref Range   WBC 7.1 3.8 - 10.6 K/uL   RBC 3.31 (L) 4.40 - 5.90 MIL/uL   Hemoglobin 10.4 (L) 13.0 - 18.0 g/dL   HCT 32.9 (L) 40.0 - 52.0 %   MCV  99.2 80.0 - 100.0 fL   MCH 31.3 26.0 - 34.0 pg   MCHC 31.6 (L) 32.0 - 36.0 g/dL   RDW 15.1 (H) 11.5 - 14.5 %   Platelets 164 150 - 440 K/uL   Neutrophils Relative % 73 %   Neutro Abs 5.2 1.4 - 6.5 K/uL   Lymphocytes Relative 13 %   Lymphs Abs 1.0 1.0 - 3.6 K/uL   Monocytes Relative 12 %   Monocytes Absolute 0.9 0.2 - 1.0 K/uL   Eosinophils Relative 1 %   Eosinophils Absolute 0.0 0 - 0.7 K/uL   Basophils Relative 1 %   Basophils Absolute 0.1 0 - 0.1 K/uL  Comprehensive metabolic panel     Status: Abnormal   Collection Time: 06/20/17  2:45 PM  Result Value Ref Range   Sodium 139 135 - 145 mmol/L   Potassium 5.0 3.5 - 5.1 mmol/L   Chloride 108 101 - 111 mmol/L   CO2 19 (L) 22 - 32 mmol/L   Glucose, Bld 344 (H) 65 - 99 mg/dL   BUN 156 (H) 6 - 20 mg/dL    Comment: RESULT CONFIRMED BY MANUAL DILUTION. TCH.   Creatinine, Ser 5.66 (H) 0.61 - 1.24 mg/dL   Calcium 7.7 (L) 8.9 - 10.3 mg/dL   Total Protein 7.0 6.5 - 8.1 g/dL   Albumin 3.6 3.5 - 5.0 g/dL   AST 21 15 - 41 U/L   ALT 19 17 - 63 U/L   Alkaline Phosphatase 86 38 - 126 U/L   Total Bilirubin 0.5 0.3 - 1.2 mg/dL   GFR calc non Af Amer 9 (L) >60 mL/min   GFR calc Af Amer 10 (L) >60 mL/min    Comment: (NOTE) The eGFR has been calculated using the CKD EPI equation. This calculation has not been validated in all clinical situations. eGFR's persistently <60 mL/min signify possible Chronic Kidney Disease.    Anion gap 12 5 - 15  Troponin I     Status: None   Collection Time: 06/20/17  2:45 PM  Result Value Ref Range   Troponin I <0.03 <0.03 ng/mL  Brain natriuretic peptide     Status: Abnormal   Collection Time: 06/20/17  2:45 PM  Result Value Ref Range   B Natriuretic Peptide 404.0 (H) 0.0 - 100.0 pg/mL  TSH     Status: None   Collection Time: 06/20/17  2:45 PM  Result Value  Ref Range   TSH 4.244 0.350 - 4.500 uIU/mL    Comment: Performed by a 3rd Generation assay with a functional sensitivity of <=0.01 uIU/mL.  Glucose,  capillary     Status: Abnormal   Collection Time: 06/20/17  9:15 PM  Result Value Ref Range   Glucose-Capillary 249 (H) 65 - 99 mg/dL   Dg Chest Portable 1 View  Result Date: 06/20/2017 CLINICAL DATA:  Shortness of Breath EXAM: PORTABLE CHEST 1 VIEW COMPARISON:  August 01, 2011 FINDINGS: There is atelectatic change in the bases. The lungs elsewhere are clear. Heart is upper normal in size with pulmonary vascularity within normal limits. Patient is status post coronary artery bypass grafting. There is aortic atherosclerosis. There is postoperative change in the lower cervical spine. There is also postoperative change in the right shoulder. There is degenerative change in each shoulder. IMPRESSION: Bibasilar atelectasis. No edema or consolidation. Heart size upper normal with pulmonary vascularity normal. Status post coronary artery bypass grafting. There is aortic atherosclerosis. Aortic Atherosclerosis (ICD10-I70.0). Electronically Signed   By: Lowella Grip III M.D.   On: 06/20/2017 15:10    Review of Systems  Constitutional: Negative for chills and fever.  HENT: Negative for sore throat and tinnitus.   Eyes: Negative for blurred vision and redness.  Respiratory: Positive for cough and shortness of breath.   Cardiovascular: Negative for chest pain, palpitations, orthopnea and PND.  Gastrointestinal: Negative for abdominal pain, diarrhea, nausea and vomiting.  Genitourinary: Negative for dysuria, frequency and urgency.  Musculoskeletal: Negative for joint pain and myalgias.  Skin: Negative for rash.       No lesions  Neurological: Negative for speech change, focal weakness and weakness.  Endo/Heme/Allergies: Does not bruise/bleed easily.       No temperature intolerance  Psychiatric/Behavioral: Negative for depression and suicidal ideas.    Blood pressure (!) 146/74, pulse (!) 103, temperature 97.9 F (36.6 C), temperature source Oral, resp. rate (!) 22, height '5\' 8"'$  (1.727 m),  weight 100.9 kg (222 lb 8 oz), SpO2 99 %. Physical Exam  Nursing note and vitals reviewed. Constitutional: He is oriented to person, place, and time. He appears well-developed and well-nourished. He appears distressed.  HENT:  Head: Normocephalic and atraumatic.  Mouth/Throat: Oropharynx is clear and moist.  Eyes: Conjunctivae and EOM are normal. Pupils are equal, round, and reactive to light. No scleral icterus.  Neck: Normal range of motion. Neck supple. No JVD present. No tracheal deviation present. No thyromegaly present.  Cardiovascular: Normal rate, regular rhythm and normal heart sounds. Exam reveals no gallop and no friction rub.  No murmur heard. Respiratory: He is in respiratory distress.  GI: Soft. Bowel sounds are normal. He exhibits no distension. There is no tenderness.  Genitourinary:  Genitourinary Comments: Deferred  Musculoskeletal: Normal range of motion. He exhibits edema.  Lymphadenopathy:    He has no cervical adenopathy.  Neurological: He is alert and oriented to person, place, and time. No cranial nerve deficit.  Skin: Skin is warm and dry. No rash noted. No erythema.  Psychiatric: He has a normal mood and affect. His behavior is normal. Judgment and thought content normal.     Assessment/Plan This is a 76 year old male admitted for acute on chronic kidney disease. 1.  Renal failure: Acute on chronic; end-stage renal disease at this time.  Patient is fluid overloaded resulting in hypoxia.  We will try to diurese we may need to initiate dialysis.  Nephrology consulted.  IV Lasix as tolerated; monitor urine  output.  Limit fluid intake. 2.  Diabetes mellitus type 2: Continue NPH per home regimen.  Gabapentin for neuropathic pain 3.  Coronary artery disease: Stable: Continue aspirin 4.  Hypertension: Controlled; continue amlodipine and metoprolol. 5.  Gout: Controlled; hold allopurinol 6.  DVT prophylaxis: Heparin 7.  GI prophylaxis: None The patient is a full  code.  Time spent on admission orders and patient care approximately 45 minutes  Harrie Foreman, MD 06/20/2017, 10:36 PM

## 2017-06-20 NOTE — ED Notes (Signed)
Transport Pt to floor - 219.AS

## 2017-06-20 NOTE — ED Triage Notes (Signed)
Pt came to ED via EMS, coming from driving home from fishing trip. Pt startetd having sob, called EMS. Per EMS, satting 60% r/a. Arrived on cpap. History of chf,  Htn, dm, bypass.

## 2017-06-21 ENCOUNTER — Inpatient Hospital Stay: Payer: Medicare HMO

## 2017-06-21 ENCOUNTER — Inpatient Hospital Stay
Admit: 2017-06-21 | Discharge: 2017-06-21 | Disposition: A | Discharge status: Home / Self Care | Payer: Medicare HMO | Attending: Adult Health | Admitting: Adult Health

## 2017-06-21 ENCOUNTER — Other Ambulatory Visit: Payer: Self-pay

## 2017-06-21 DIAGNOSIS — J9600 Acute respiratory failure, unspecified whether with hypoxia or hypercapnia: Secondary | ICD-10-CM

## 2017-06-21 DIAGNOSIS — J9601 Acute respiratory failure with hypoxia: Secondary | ICD-10-CM

## 2017-06-21 DIAGNOSIS — N170 Acute kidney failure with tubular necrosis: Secondary | ICD-10-CM

## 2017-06-21 DIAGNOSIS — N179 Acute kidney failure, unspecified: Secondary | ICD-10-CM

## 2017-06-21 LAB — BLOOD GAS, ARTERIAL
ACID-BASE DEFICIT: 12.1 mmol/L — AB (ref 0.0–2.0)
Bicarbonate: 15.3 mmol/L — ABNORMAL LOW (ref 20.0–28.0)
FIO2: 0.6
MECHVT: 550 mL
Mechanical Rate: 25
O2 Saturation: 89.3 %
PEEP/CPAP: 5 cmH2O
PH ART: 7.19 — AB (ref 7.350–7.450)
Patient temperature: 37
pCO2 arterial: 40 mmHg (ref 32.0–48.0)
pO2, Arterial: 71 mmHg — ABNORMAL LOW (ref 83.0–108.0)

## 2017-06-21 LAB — MAGNESIUM
MAGNESIUM: 2.3 mg/dL (ref 1.7–2.4)
Magnesium: 2.5 mg/dL — ABNORMAL HIGH (ref 1.7–2.4)
Magnesium: 2.3 mg/dL (ref 1.7–2.4)

## 2017-06-21 LAB — CBC
HCT: 19.4 % — ABNORMAL LOW (ref 40.0–52.0)
HCT: 29.8 % — ABNORMAL LOW (ref 40.0–52.0)
HEMOGLOBIN: 6.1 g/dL — AB (ref 13.0–18.0)
HEMOGLOBIN: 9.5 g/dL — AB (ref 13.0–18.0)
MCH: 31.4 pg (ref 26.0–34.0)
MCH: 31.4 pg (ref 26.0–34.0)
MCHC: 31.4 g/dL — ABNORMAL LOW (ref 32.0–36.0)
MCHC: 31.9 g/dL — AB (ref 32.0–36.0)
MCV: 100.2 fL — ABNORMAL HIGH (ref 80.0–100.0)
MCV: 98.6 fL (ref 80.0–100.0)
Platelets: 184 10*3/uL (ref 150–440)
Platelets: 150 10*3/uL (ref 150–440)
RBC: 1.94 MIL/uL — AB (ref 4.40–5.90)
RBC: 3.02 MIL/uL — ABNORMAL LOW (ref 4.40–5.90)
RDW: 15.4 % — ABNORMAL HIGH (ref 11.5–14.5)
RDW: 14.8 % — ABNORMAL HIGH (ref 11.5–14.5)
WBC: 9.6 10*3/uL (ref 3.8–10.6)
WBC: 7.2 10*3/uL (ref 3.8–10.6)

## 2017-06-21 LAB — RENAL FUNCTION PANEL
ANION GAP: 11 (ref 5–15)
ANION GAP: 13 (ref 5–15)
Albumin: 3.4 g/dL — ABNORMAL LOW (ref 3.5–5.0)
Albumin: 3.3 g/dL — ABNORMAL LOW (ref 3.5–5.0)
BUN: 121 mg/dL — AB (ref 6–20)
BUN: 113 mg/dL — ABNORMAL HIGH (ref 6–20)
CALCIUM: 7.5 mg/dL — AB (ref 8.9–10.3)
CO2: 22 mmol/L (ref 22–32)
CO2: 20 mmol/L — ABNORMAL LOW (ref 22–32)
Calcium: 7.6 mg/dL — ABNORMAL LOW (ref 8.9–10.3)
Chloride: 108 mmol/L (ref 101–111)
Chloride: 107 mmol/L (ref 101–111)
Creatinine, Ser: 4.46 mg/dL — ABNORMAL HIGH (ref 0.61–1.24)
Creatinine, Ser: 4.15 mg/dL — ABNORMAL HIGH (ref 0.61–1.24)
GFR calc Af Amer: 14 mL/min — ABNORMAL LOW (ref >60)
GFR calc non Af Amer: 12 mL/min — ABNORMAL LOW (ref >60)
GFR calc non Af Amer: 13 mL/min — ABNORMAL LOW (ref >60)
GFR, EST AFRICAN AMERICAN: 15 mL/min — AB (ref >60)
GLUCOSE: 167 mg/dL — AB (ref 65–99)
GLUCOSE: 150 mg/dL — AB (ref 65–99)
PHOSPHORUS: 5.9 mg/dL — AB (ref 2.5–4.6)
POTASSIUM: 4 mmol/L (ref 3.5–5.1)
POTASSIUM: 4 mmol/L (ref 3.5–5.1)
Phosphorus: 6.4 mg/dL — ABNORMAL HIGH (ref 2.5–4.6)
Sodium: 141 mmol/L (ref 135–145)
Sodium: 140 mmol/L (ref 135–145)

## 2017-06-21 LAB — COMPREHENSIVE METABOLIC PANEL
ALBUMIN: 3.3 g/dL — AB (ref 3.5–5.0)
ALK PHOS: 75 U/L (ref 38–126)
ALT: 17 U/L (ref 17–63)
ANION GAP: 9 (ref 5–15)
AST: 14 U/L — ABNORMAL LOW (ref 15–41)
BUN: 150 mg/dL — ABNORMAL HIGH (ref 6–20)
CO2: 18 mmol/L — AB (ref 22–32)
Calcium: 7.2 mg/dL — ABNORMAL LOW (ref 8.9–10.3)
Chloride: 112 mmol/L — ABNORMAL HIGH (ref 101–111)
Creatinine, Ser: 5.82 mg/dL — ABNORMAL HIGH (ref 0.61–1.24)
GFR calc non Af Amer: 9 mL/min — ABNORMAL LOW (ref >60)
GFR, EST AFRICAN AMERICAN: 10 mL/min — AB (ref >60)
GLUCOSE: 276 mg/dL — AB (ref 65–99)
POTASSIUM: 5.9 mmol/L — AB (ref 3.5–5.1)
SODIUM: 139 mmol/L (ref 135–145)
TOTAL PROTEIN: 6.3 g/dL — AB (ref 6.5–8.1)
Total Bilirubin: 0.5 mg/dL (ref 0.3–1.2)

## 2017-06-21 LAB — ECHOCARDIOGRAM COMPLETE
Area-P 1/2: 4.68 cm2
E/e' ratio: 10.34
EWDT: 162 ms
FS: 19 % — AB (ref 28–44)
HEIGHTINCHES: 68 in
IVS/LV PW RATIO, ED: 1.5
LA ID, A-P, ES: 48 mm
LA vol A4C: 42.2 ml
LADIAMINDEX: 2.11 cm/m2
LAVOL: 53.5 mL
LAVOLIN: 23.5 mL/m2
LEFT ATRIUM END SYS DIAM: 48 mm
LV E/e' medial: 10.34
LV E/e'average: 10.34
LV e' LATERAL: 11.6 cm/s
Lateral S' vel: 11.1 cm/s
MV Dec: 162
MV pk A vel: 112 m/s
MV pk E vel: 120 m/s
MVPG: 6 mmHg
P 1/2 time: 47 ms
PW: 10.6 mm — AB (ref 0.6–1.1)
RV TAPSE: 17 mm
TDI e' lateral: 11.6
TDI e' medial: 6.96
WEIGHTICAEL: 3683.2 [oz_av]

## 2017-06-21 LAB — PHOSPHORUS: PHOSPHORUS: 10.1 mg/dL — AB (ref 2.5–4.6)

## 2017-06-21 LAB — GLUCOSE, CAPILLARY
Glucose-Capillary: 269 mg/dL — ABNORMAL HIGH (ref 65–99)
Glucose-Capillary: 271 mg/dL — ABNORMAL HIGH (ref 65–99)
Glucose-Capillary: 249 mg/dL — ABNORMAL HIGH (ref 65–99)
Glucose-Capillary: 150 mg/dL — ABNORMAL HIGH (ref 65–99)
Glucose-Capillary: 143 mg/dL — ABNORMAL HIGH (ref 65–99)

## 2017-06-21 LAB — TROPONIN I: Troponin I: 0.03 ng/mL (ref <0.03)

## 2017-06-21 LAB — HEMOGLOBIN A1C
Hgb A1c MFr Bld: 7.3 % — ABNORMAL HIGH (ref 4.8–5.6)
Mean Plasma Glucose: 162.81 mg/dL

## 2017-06-21 LAB — LACTIC ACID, PLASMA: LACTIC ACID, VENOUS: 1 mmol/L (ref 0.5–1.9)

## 2017-06-21 LAB — PROTIME-INR
INR: 1.2
PROTHROMBIN TIME: 15.1 s (ref 11.4–15.2)

## 2017-06-21 LAB — TRIGLYCERIDES: Triglycerides: 152 mg/dL — ABNORMAL HIGH (ref <150)

## 2017-06-21 LAB — APTT: aPTT: 35 seconds (ref 24–36)

## 2017-06-21 LAB — MRSA PCR SCREENING: MRSA by PCR: NEGATIVE

## 2017-06-21 LAB — AMMONIA: AMMONIA: 25 umol/L (ref 9–35)

## 2017-06-21 MED ORDER — MIDAZOLAM HCL 2 MG/2ML IJ SOLN: 1.0000 mg | INTRAMUSCULAR | Status: DC | PRN

## 2017-06-21 MED ORDER — FENTANYL CITRATE (PF) 100 MCG/2ML IJ SOLN
INTRAMUSCULAR | Status: AC
  Filled 2017-06-21: qty 2

## 2017-06-21 MED ORDER — FUROSEMIDE 10 MG/ML IJ SOLN
40.0000 mg | Freq: Two times a day (BID) | INTRAMUSCULAR | Status: DC
  Administered 2017-06-21 – 2017-06-26 (×12): 40 mg via INTRAVENOUS
  Filled 2017-06-21 (×12): qty 4

## 2017-06-21 MED ORDER — BISACODYL 10 MG RE SUPP: 10.0000 mg | Freq: Every day | RECTAL | Status: DC | PRN

## 2017-06-21 MED ORDER — NOREPINEPHRINE BITARTRATE 1 MG/ML IV SOLN
0.0000 ug/min | INTRAVENOUS | Status: DC
  Administered 2017-06-21: 5 ug/min via INTRAVENOUS
  Filled 2017-06-21 (×2): qty 4

## 2017-06-21 MED ORDER — PIPERACILLIN-TAZOBACTAM 3.375 G IVPB
3.3750 g | Freq: Two times a day (BID) | INTRAVENOUS | Status: DC
  Administered 2017-06-21 – 2017-06-22 (×3): 3.375 g via INTRAVENOUS
  Filled 2017-06-21 (×3): qty 50

## 2017-06-21 MED ORDER — ORAL CARE MOUTH RINSE
15.0000 mL | OROMUCOSAL | Status: DC
  Administered 2017-06-21 – 2017-06-24 (×29): 15 mL via OROMUCOSAL

## 2017-06-21 MED ORDER — ETOMIDATE 2 MG/ML IV SOLN
INTRAVENOUS | Status: AC
  Administered 2017-06-21: 20 mg via INTRAVENOUS
  Filled 2017-06-21: qty 10

## 2017-06-21 MED ORDER — IPRATROPIUM-ALBUTEROL 0.5-2.5 (3) MG/3ML IN SOLN
3.0000 mL | Freq: Four times a day (QID) | RESPIRATORY_TRACT | Status: DC
  Administered 2017-06-21 – 2017-06-25 (×17): 3 mL via RESPIRATORY_TRACT
  Filled 2017-06-21 (×15): qty 3

## 2017-06-21 MED ORDER — FENTANYL CITRATE (PF) 100 MCG/2ML IJ SOLN
200.0000 ug | Freq: Once | INTRAMUSCULAR | Status: AC
  Administered 2017-06-21: 200 ug via INTRAVENOUS

## 2017-06-21 MED ORDER — PROPOFOL 1000 MG/100ML IV EMUL
0.0000 ug/kg/min | INTRAVENOUS | Status: DC
  Administered 2017-06-21: 50 ug/kg/min via INTRAVENOUS
  Administered 2017-06-21: 40 ug/kg/min via INTRAVENOUS
  Administered 2017-06-21 – 2017-06-22 (×3): 50 ug/kg/min via INTRAVENOUS
  Administered 2017-06-22: 40 ug/kg/min via INTRAVENOUS
  Administered 2017-06-22 (×3): 50 ug/kg/min via INTRAVENOUS
  Administered 2017-06-23 (×2): 45 ug/kg/min via INTRAVENOUS
  Filled 2017-06-21 (×14): qty 100

## 2017-06-21 MED ORDER — INSULIN ASPART 100 UNIT/ML ~~LOC~~ SOLN
0.0000 [IU] | SUBCUTANEOUS | Status: DC
  Administered 2017-06-21: 2 [IU] via SUBCUTANEOUS
  Administered 2017-06-21: 8 [IU] via SUBCUTANEOUS
  Administered 2017-06-21: 2 [IU] via SUBCUTANEOUS
  Administered 2017-06-21: 5 [IU] via SUBCUTANEOUS
  Administered 2017-06-22 (×5): 3 [IU] via SUBCUTANEOUS
  Administered 2017-06-22 – 2017-06-23 (×2): 2 [IU] via SUBCUTANEOUS
  Administered 2017-06-23 (×4): 3 [IU] via SUBCUTANEOUS
  Administered 2017-06-24 (×3): 2 [IU] via SUBCUTANEOUS
  Administered 2017-06-24 (×2): 3 [IU] via SUBCUTANEOUS
  Filled 2017-06-21 (×20): qty 1

## 2017-06-21 MED ORDER — ADULT MULTIVITAMIN LIQUID CH
15.0000 mL | Freq: Every day | ORAL | Status: DC
  Administered 2017-06-22 – 2017-06-23 (×2): 15 mL
  Filled 2017-06-21 (×3): qty 15

## 2017-06-21 MED ORDER — CHLORHEXIDINE GLUCONATE 0.12% ORAL RINSE (MEDLINE KIT)
15.0000 mL | Freq: Two times a day (BID) | OROMUCOSAL | Status: DC
  Administered 2017-06-21 – 2017-06-24 (×7): 15 mL via OROMUCOSAL

## 2017-06-21 MED ORDER — SODIUM BICARBONATE 8.4 % IV SOLN
150.0000 meq | Freq: Once | INTRAVENOUS | Status: AC
  Administered 2017-06-21: 150 meq via INTRAVENOUS
  Filled 2017-06-21: qty 150

## 2017-06-21 MED ORDER — FENTANYL CITRATE (PF) 100 MCG/2ML IJ SOLN
INTRAMUSCULAR | Status: AC
  Administered 2017-06-21: 100 ug
  Filled 2017-06-21: qty 4

## 2017-06-21 MED ORDER — ETOMIDATE 2 MG/ML IV SOLN
20.0000 mg | Freq: Once | INTRAVENOUS | Status: AC
  Administered 2017-06-21: 20 mg via INTRAVENOUS

## 2017-06-21 MED ORDER — MIDAZOLAM HCL 2 MG/2ML IJ SOLN
2.0000 mg | Freq: Once | INTRAMUSCULAR | Status: AC
  Administered 2017-06-21: 2 mg via INTRAVENOUS

## 2017-06-21 MED ORDER — MIDAZOLAM HCL 2 MG/2ML IJ SOLN
INTRAMUSCULAR | Status: AC
  Filled 2017-06-21: qty 2

## 2017-06-21 MED ORDER — PROPOFOL 1000 MG/100ML IV EMUL
INTRAVENOUS | Status: AC
  Administered 2017-06-21: 40 ug/kg/min via INTRAVENOUS
  Filled 2017-06-21: qty 100

## 2017-06-21 MED ORDER — MIDAZOLAM HCL 2 MG/2ML IJ SOLN
INTRAMUSCULAR | Status: AC
  Administered 2017-06-21: 2 mg via INTRAVENOUS
  Filled 2017-06-21: qty 4

## 2017-06-21 MED ORDER — INSULIN DETEMIR 100 UNIT/ML ~~LOC~~ SOLN
10.0000 [IU] | Freq: Two times a day (BID) | SUBCUTANEOUS | Status: DC
  Administered 2017-06-21 – 2017-07-02 (×22): 10 [IU] via SUBCUTANEOUS
  Filled 2017-06-21 (×25): qty 0.1

## 2017-06-21 MED ORDER — MIDAZOLAM HCL 2 MG/2ML IJ SOLN
INTRAMUSCULAR | Status: AC
  Administered 2017-06-21: 4 mg via INTRAVENOUS
  Filled 2017-06-21: qty 2

## 2017-06-21 MED ORDER — HEPARIN SODIUM (PORCINE) 1000 UNIT/ML DIALYSIS
1000.0000 [IU] | INTRAMUSCULAR | Status: DC | PRN
  Administered 2017-06-22 – 2017-06-23 (×2): 1700 [IU] via INTRAVENOUS_CENTRAL
  Filled 2017-06-21 (×4): qty 6

## 2017-06-21 MED ORDER — PRO-STAT SUGAR FREE PO LIQD
60.0000 mL | Freq: Four times a day (QID) | ORAL | Status: DC
  Administered 2017-06-21 – 2017-06-23 (×8): 60 mL

## 2017-06-21 MED ORDER — FENTANYL CITRATE (PF) 100 MCG/2ML IJ SOLN
INTRAMUSCULAR | Status: AC
  Administered 2017-06-21: 200 ug via INTRAVENOUS
  Filled 2017-06-21: qty 2

## 2017-06-21 MED ORDER — VITAL HIGH PROTEIN PO LIQD
1000.0000 mL | ORAL | Status: DC
  Administered 2017-06-21 – 2017-06-22 (×2): 1000 mL

## 2017-06-21 MED ORDER — SENNOSIDES 8.8 MG/5ML PO SYRP
5.0000 mL | ORAL_SOLUTION | Freq: Two times a day (BID) | ORAL | Status: DC | PRN
  Filled 2017-06-21: qty 5

## 2017-06-21 MED ORDER — PANTOPRAZOLE SODIUM 40 MG IV SOLR
40.0000 mg | Freq: Every day | INTRAVENOUS | Status: DC
  Administered 2017-06-21 – 2017-06-22 (×2): 40 mg via INTRAVENOUS
  Filled 2017-06-21 (×2): qty 40

## 2017-06-21 MED ORDER — MIDAZOLAM HCL 2 MG/2ML IJ SOLN
4.0000 mg | Freq: Once | INTRAMUSCULAR | Status: AC
  Administered 2017-06-21: 4 mg via INTRAVENOUS

## 2017-06-21 MED ORDER — PUREFLOW DIALYSIS SOLUTION
INTRAVENOUS | Status: DC
  Administered 2017-06-21 – 2017-06-22 (×2): via INTRAVENOUS_CENTRAL
  Administered 2017-06-22: 3 via INTRAVENOUS_CENTRAL
  Administered 2017-06-22: 02:00:00 via INTRAVENOUS_CENTRAL
  Administered 2017-06-23: 3 via INTRAVENOUS_CENTRAL

## 2017-06-21 MED ORDER — ORAL CARE MOUTH RINSE: 15.0000 mL | Freq: Four times a day (QID) | OROMUCOSAL | Status: DC

## 2017-06-21 MED ORDER — DOCUSATE SODIUM 50 MG/5ML PO LIQD
100.0000 mg | Freq: Two times a day (BID) | ORAL | Status: DC
  Administered 2017-06-21 – 2017-06-22 (×3): 100 mg via ORAL
  Filled 2017-06-21 (×3): qty 10

## 2017-06-21 MED ORDER — FENTANYL CITRATE (PF) 100 MCG/2ML IJ SOLN
100.0000 ug | INTRAMUSCULAR | Status: DC | PRN
Start: 2017-06-21 — End: 2017-06-23
  Administered 2017-06-23: 100 ug via INTRAVENOUS
  Filled 2017-06-21: qty 2

## 2017-06-21 MED ORDER — SODIUM CHLORIDE 0.9 % IV BOLUS (SEPSIS)
1000.0000 mL | Freq: Once | INTRAVENOUS | Status: AC
  Administered 2017-06-21: 1000 mL via INTRAVENOUS

## 2017-06-21 MED ORDER — FUROSEMIDE 10 MG/ML IJ SOLN: 100.0000 mg | Freq: Two times a day (BID) | INTRAVENOUS | Status: DC

## 2017-06-21 NOTE — Progress Notes (Signed)
Wife Christopher [name] notified by supervisor pt being transferred to CCU.

## 2017-06-21 NOTE — Progress Notes (Signed)
MEDICATION RELATED CONSULT NOTE - INITIAL   Pharmacy Consult for Medication dosage adjustment for CRRT  Indication:  CRRT   Allergies  Allergen Reactions  . Neurontin [Gabapentin] Other (See Comments)    Swelling when taking everyday    Patient Measurements: Height: '5\' 8"'$  (172.7 cm) Weight: 230 lb 3.2 oz (104.4 kg) IBW/kg (Calculated) : 68.4 Adjusted Body Weight:   Vital Signs: BP: 113/65 (11/11 1000) Pulse Rate: 100 (11/11 1000) Intake/Output from previous day: 11/10 0701 - 11/11 0700 In: -  Out: 200 [Urine:200] Intake/Output from this shift: No intake/output data recorded.  Labs: Recent Labs    06/20/17 1445 06/21/17 0605 06/21/17 0700  WBC 7.1 9.6 7.2  HGB 10.4* 6.1* 9.5*  HCT 32.9* 19.4* 29.8*  PLT 164 184 150  APTT  --  35  --   CREATININE 5.66* 5.82*  --   MG  --  2.5*  --   PHOS  --  10.1*  --   ALBUMIN 3.6 3.3*  --   PROT 7.0 6.3*  --   AST 21 14*  --   ALT 19 17  --   ALKPHOS 86 75  --   BILITOT 0.5 0.5  --    Estimated Creatinine Clearance: 13 mL/min (A) (by C-G formula based on SCr of 5.82 mg/dL (H)).   Microbiology: Recent Results (from the past 720 hour(s))  MRSA PCR Screening     Status: None   Collection Time: 06/21/17  5:00 AM  Result Value Ref Range Status   MRSA by PCR NEGATIVE NEGATIVE Final    Comment:        The GeneXpert MRSA Assay (FDA approved for NASAL specimens only), is one component of a comprehensive MRSA colonization surveillance program. It is not intended to diagnose MRSA infection nor to guide or monitor treatment for MRSA infections.     Medical History: Past Medical History:  Diagnosis Date  . Anginal pain ([place])   . Anxiety   . Arthritis    "all over"  . Chronic back pain greater than 3 months duration   . 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 Coronary artery disease    [facility]  . Hyperlipemia   . Hypertension    stress test done    [facility] [place]  . Kidney stones   . Myocardial infarction ([place]) 2003  . 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 Pneumonia    "when I was a baby"  . Type II diabetes mellitus ([place])    "take a pill for it"  . Urination, excessive at night     Medications:  Scheduled:  . amitriptyline  10 mg Oral QHS  . amLODipine  10 mg Oral Daily  . aspirin EC  81 mg Oral Daily  . chlorhexidine gluconate (MEDLINE KIT)  15 mL Mouth Rinse BID  . docusate sodium  100 mg Oral BID  . feeding supplement (PRO-STAT SUGAR FREE 64)  60 mL Per Tube QID  . feeding supplement (VITAL HIGH PROTEIN)  1,000 mL Per Tube Q24H  . furosemide  40 mg Intravenous Q12H  . heparin  5,000 Units Subcutaneous Q8H  . insulin aspart  0-15 Units Subcutaneous Q4H  . insulin NPH Human  10 Units Subcutaneous BID AC & HS  . insulin NPH Human  5 Units Subcutaneous QHS  . ipratropium-albuterol  3 mL Nebulization Q6H  . mouth rinse  15 mL Mouth Rinse 10 times per day  . metoprolol succinate  25 mg Oral Daily  . [START ON 06/22/2017] multivitamin  15 mL Per Tube Daily  . pantoprazole (PROTONIX) IV  40 mg Intravenous Daily    Assessment: No meds need adjusting at this time .   Goal of Therapy:    Plan:  Will continue to monitor daily.   Christopher [name] D 06/21/2017,12:15 PM

## 2017-06-21 NOTE — Progress Notes (Signed)
Asked to look at patient due to low sats. Patient on venturi mask 50% with sats on mid 80's. Patient not arousable. Placed on 100%, abg drawn per rapid response, rapid response called

## 2017-06-21 NOTE — Progress Notes (Signed)
Dr Tobi BastosPyreddy returned page, orders given the patient transferred to ccu, belongings  And dentures taken to ccu

## 2017-06-21 NOTE — Progress Notes (Signed)
Pt sats dropped to 84-86%, resp notified in to see pt, lethargic, sternal rub preformed opened eyes back to sleep, rapid response called. Pt placed on non-rebreather, sats 88-90% supervisor in to see pt.Md paged.

## 2017-06-21 NOTE — Consult Note (Signed)
[facility]  CONSULT NOTE    Date: 06/21/2017                  Patient Name:  Christopher [name]  MRN: [id number]  DOB: Oct 20, 1940  Age / Sex: 76 y.o., male         PCP: System, Provider Not In                 Service Requesting Consult: Dr. Mortimer Fries                 Reason for Consult: Acute Renal Failure            History of Present Illness: Christopher [name] is a 76 y.o. white male with chronic kidney disease stage IV baseline creatinine 2.5-3, coronary artery disease, hypertension, hyperlipidemia, left partial nephrectomy, gout, diabetes mellitus type II insulin dependent, diabetic peripheral neuropathy and osteoarthritis, who was admitted to [facility] on 06/20/2017 for SOB (shortness of breath) [R06.02] Hypoxia [R09.02] Acute renal failure, unspecified acute renal failure type ([place]) [N17.9]  Wife and daughter at bedside.   Patient lives [place]. [place] Developed shortness of breath while riding back home. Admitted to [facility] ICU. BUN 150.    Medications: Outpatient medications: Medications Prior to Admission  Medication Sig Dispense Refill Last Dose  . allopurinol (ZYLOPRIM) 100 MG tablet Take 1 tablet daily by mouth.   06/20/2017 at Unknown time  . amitriptyline (ELAVIL) 10 MG tablet Take 1 tablet (10 mg total) by mouth at bedtime. 30 tablet 1 06/19/2017 at Unknown time  . amLODipine (NORVASC) 10 MG tablet Take 10 mg by mouth daily.     06/20/2017 at Unknown time  . aspirin EC 325 MG tablet Take 81 mg by mouth daily.     06/20/2017 at Unknown time  . furosemide (LASIX) 40 MG tablet Take 1 tablet (40 mg total) by mouth daily. 30 tablet 1 06/20/2017 at Unknown time  . insulin NPH Human (HUMULIN N,NOVOLIN N) 100 UNIT/ML injection Inject 10 Units 2 (two) times daily into the skin.   06/20/2017 at Unknown time  . metoprolol succinate (TOPROL-XL) 100 MG 24 hr tablet Take 25 mg by mouth daily.     06/20/2017  at Unknown time  . Multiple Vitamin (MULTIVITAMIN) tablet Take 1 tablet by mouth daily.     06/20/2017 at Unknown time  . Omega-3 Fatty Acids (FISH OIL) 1200 MG CAPS Take 1 capsule by mouth daily.     06/20/2017 at Unknown time  . potassium chloride SA (K-DUR,KLOR-CON) 20 MEQ tablet Take 1 tablet (20 mEq total) by mouth daily. 30 tablet 1 06/20/2017 at Unknown time  . gabapentin (NEURONTIN) 600 MG tablet Take 600 mg by mouth Three times daily as needed. For nerve pain   prn at prn    Current medications: Current Facility-Administered Medications  Medication Dose Route Frequency Provider Last Rate Last Dose  . acetaminophen (TYLENOL) tablet 650 mg  650 mg Oral Q6H PRN Harrie Foreman, MD       Or  . acetaminophen (TYLENOL) suppository 650 mg  650 mg Rectal Q6H PRN Harrie Foreman, MD      . amitriptyline (ELAVIL) tablet 10 mg  10 mg Oral QHS Harrie Foreman, MD   10 mg at 06/20/17 2211  . amLODipine (NORVASC) tablet 10 mg  10 mg Oral Daily Harrie Foreman, MD      .  aspirin EC tablet 81 mg  81 mg Oral Daily Harrie Foreman, MD      . bisacodyl (DULCOLAX) suppository 10 mg  10 mg Rectal Daily PRN Dorene Sorrow S, NP      . chlorhexidine gluconate (MEDLINE KIT) (PERIDEX) 0.12 % solution 15 mL  15 mL Mouth Rinse BID Tukov, Magadalene S, NP      . docusate sodium (COLACE) capsule 100 mg  100 mg Oral BID Harrie Foreman, MD   100 mg at 06/20/17 2212  . fentaNYL (SUBLIMAZE) injection 100 mcg  100 mcg Intravenous Q2H PRN Tukov, Magadalene S, NP      . furosemide (LASIX) injection 40 mg  40 mg Intravenous Q12H Harrie Foreman, MD   40 mg at 06/21/17 0107  . gabapentin (NEURONTIN) tablet 600 mg  600 mg Oral TID PRN Harrie Foreman, MD      . heparin injection 5,000 Units  5,000 Units Subcutaneous Q8H Harrie Foreman, MD   5,000 Units at 06/20/17 2212  . insulin aspart (novoLOG) injection 0-15 Units  0-15 Units Subcutaneous Q4H Dorene Sorrow S, NP   8 Units at 06/21/17  0840  . insulin NPH Human (HUMULIN N,NOVOLIN N) injection 10 Units  10 Units Subcutaneous BID AC & HS Harrie Foreman, MD      . insulin NPH Human (HUMULIN N,NOVOLIN N) injection 5 Units  5 Units Subcutaneous QHS Harrie Foreman, MD   5 Units at 06/20/17 2316  . ipratropium-albuterol (DUONEB) 0.5-2.5 (3) MG/3ML nebulizer solution 3 mL  3 mL Nebulization Q6H Tukov, Magadalene S, NP   3 mL at 06/21/17 0840  . MEDLINE mouth rinse  15 mL Mouth Rinse 10 times per day Flora Lipps, MD      . metoprolol succinate (TOPROL-XL) 24 hr tablet 25 mg  25 mg Oral Daily Harrie Foreman, MD      . midazolam (VERSED) injection 1 mg  1 mg Intravenous Q15 min PRN Mikael Spray, NP      . midazolam (VERSED) injection 1 mg  1 mg Intravenous Q2H PRN Mikael Spray, NP      . multivitamin with minerals tablet 1 tablet  1 tablet Oral Daily Harrie Foreman, MD      . norepinephrine (LEVOPHED) 4 mg in dextrose 5 % 250 mL (0.016 mg/mL) infusion  0-40 mcg/min Intravenous Titrated Dorene Sorrow S, NP 18.8 mL/hr at 06/21/17 0630 5 mcg/min at 06/21/17 0630  . omega-3 acid ethyl esters (LOVAZA) capsule 1 g  1 g Oral BID Harrie Foreman, MD   1 g at 06/20/17 2214  . ondansetron (ZOFRAN) tablet 4 mg  4 mg Oral Q6H PRN Harrie Foreman, MD       Or  . ondansetron [facility]) injection 4 mg  4 mg Intravenous Q6H PRN Harrie Foreman, MD      . pantoprazole (PROTONIX) injection 40 mg  40 mg Intravenous Daily Tukov, Magadalene S, NP      . piperacillin-tazobactam (ZOSYN) IVPB 3.375 g  3.375 g Intravenous Q12H Dorene Sorrow S, NP 12.5 mL/hr at 06/21/17 0841 3.375 g at 06/21/17 0841  . propofol (DIPRIVAN) 1000 MG/100ML infusion  0-50 mcg/kg/min Intravenous Continuous Dorene Sorrow S, NP 25.1 mL/hr at 06/21/17 0620 40 mcg/kg/min at 06/21/17 0620  . sennosides (SENOKOT) 8.8 MG/5ML syrup 5 mL  5 mL Per Tube BID PRN Mikael Spray, NP          Allergies: Allergies  Allergen  Reactions  .  Neurontin [Gabapentin] Other (See Comments)    Swelling when taking everyday      Past Medical History: Past Medical History:  Diagnosis Date  . Anginal pain ([place])   . Anxiety   . Arthritis    "all over"  . Chronic back pain greater than 3 months duration   . 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 Coronary artery disease    [facility]  . Hyperlipemia   . Hypertension    stress test done    [facility] [place]  . Kidney stones   . Myocardial infarction ([place]) 2003  . 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 Pneumonia    "when I was a baby"  . Type II diabetes mellitus ([place])    "take a pill for it"  . Urination, excessive at night      Past Surgical History: Past Surgical History:  Procedure Laterality Date  . ANTERIOR FUSION CERVICAL SPINE  2005  . BACK SURGERY    . CORONARY ANGIOPLASTY     2003      [facility]  . INCISION AND DRAINAGE OF WOUND  ~ 2003   "stuck welding tip in my thump; left; got staph infection in it"  . JOINT REPLACEMENT    . LUMBAR DISC SURGERY  1970's   "put serum made out of papaya fruit; took it off the market"  . PARTIAL NEPHRECTOMY  ~ 2000   left; "took tumor out"  . ROTATOR CUFF REPAIR  1980's   bilateral  . THORACIC DISCECTOMY  04/07/2012  . TOTAL KNEE ARTHROPLASTY  2005   left     Family History: No family history on file.   Social History: Social History   Socioeconomic History  . Marital status: Married    Spouse name: Not on file  . Number of children: Not on file  . Years of education: Not on file  . Highest education level: Not on file  Social Needs  . Financial resource strain: Not on file  . Food insecurity - worry: Not on file  . Food insecurity - inability: Not on file  . Transportation needs - medical: Not on file  . Transportation needs - non-medical: Not on file  Occupational History  . Not on file  Tobacco Use  . Smoking status:  Former Smoker    Packs/day: 2.00    Years: 30.00    Pack years: 60.00    Types: Cigarettes    Last attempt to quit: 07/17/1985    Years since quitting: 31.9  . Smokeless tobacco: Former Systems developer    Types: Chew  . Tobacco comment: 04/07/2012 "stopped chewing back in the 1980's"  Substance and Sexual Activity  . Alcohol use: Yes    Comment: 04/07/2012 "'bout a beer a month if that"  . Drug use: No  . Sexual activity: Not Currently  Other Topics Concern  . Not on file  Social History Narrative  . Not on file     Review of Systems: Review of Systems  Unable to perform ROS: Critical illness    Vital Signs: Blood pressure 113/65, pulse 100, temperature 97.9 F (36.6 C), temperature source Oral, resp. rate (!) 25, height '5\' 8"'$  (1.727 m), weight 104.4 kg (230 lb 3.2 oz), SpO2 93 %.  Weight trends: Filed Weights   06/20/17 1447 06/20/17 2041 06/21/17 0213  Weight: 99.8 kg (220 lb) 100.9  kg (222 lb 8 oz) 104.4 kg (230 lb 3.2 oz)    Physical Exam: General: Critically ill  Head: +ETT  Eyes: Anicteric, PERRL  Neck: Supple, trachea midline  Lungs:  Crackles bilaterally, PRVC FiO2 60%  Heart: Regular rate and rhythm  Abdomen:  Obese, distended  Extremities: ++ peripheral edema.  Neurologic: Nonfocal, moving all four extremities  Skin: No lesions  Access: none     Lab results: Basic Metabolic Panel: Recent Labs  Lab 06/20/17 1445 06/21/17 0605  NA 139 139  K 5.0 5.9*  CL 108 112*  CO2 19* 18*  GLUCOSE 344* 276*  BUN 156* 150*  CREATININE 5.66* 5.82*  CALCIUM 7.7* 7.2*  MG  --  2.5*  PHOS  --  10.1*    Liver Function Tests: Recent Labs  Lab 06/20/17 1445 06/21/17 0605  AST 21 14*  ALT 19 17  ALKPHOS 86 75  BILITOT 0.5 0.5  PROT 7.0 6.3*  ALBUMIN 3.6 3.3*   No results for input(s): LIPASE, AMYLASE in the last 168 hours. Recent Labs  Lab 06/21/17 0720  AMMONIA 25    CBC: Recent Labs  Lab 06/20/17 1445 06/21/17 0605 06/21/17 0700  WBC 7.1 9.6 7.2   NEUTROABS 5.2  --   --   HGB 10.4* 6.1* 9.5*  HCT 32.9* 19.4* 29.8*  MCV 99.2 100.2* 98.6  PLT 164 184 150    Cardiac Enzymes: Recent Labs  Lab 06/20/17 1445 06/21/17 0608  TROPONINI <0.03 0.03*    BNP: Invalid input(s): POCBNP  CBG: Recent Labs  Lab 06/20/17 2115 06/20/17 2351 06/21/17 0450 06/21/17 0724  GLUCAP 249* 196* 269* 11*    Microbiology: Results for orders placed or performed during the hospital encounter of 06/20/17  MRSA PCR Screening     Status: None   Collection Time: 06/21/17  5:00 AM  Result Value Ref Range Status   MRSA by PCR NEGATIVE NEGATIVE Final    Comment:        The GeneXpert MRSA Assay (FDA approved for NASAL specimens only), is one component of a comprehensive MRSA colonization surveillance program. It is not intended to diagnose MRSA infection nor to guide or monitor treatment for MRSA infections.     Coagulation Studies: Recent Labs    06/21/17 0605  LABPROT 15.1  INR 1.20    Urinalysis: No results for input(s): COLORURINE, LABSPEC, PHURINE, GLUCOSEU, HGBUR, BILIRUBINUR, KETONESUR, PROTEINUR, UROBILINOGEN, NITRITE, LEUKOCYTESUR in the last 72 hours.  Invalid input(s): APPERANCEUR    Imaging: Dg Abd 1 View  Result Date: 06/21/2017 CLINICAL DATA:  Nasogastric tube placement. EXAM: ABDOMEN - 1 VIEW COMPARISON:  Lumbar spine radiographs performed 04/17/2017 FINDINGS: The patient's enteric tube is noted ending overlying the body of the stomach. The visualized bowel gas pattern is grossly unremarkable. No free intra-abdominal air is seen, though evaluation for free air is limited on a single supine view. Mild degenerative change is noted along the lumbar spine. The patient is status post median sternotomy. IMPRESSION: Enteric tube noted ending overlying the body of the stomach. Electronically Signed   By: Garald Balding M.D.   On: 06/21/2017 06:27   Dg Chest Port 1 View  Result Date: 06/21/2017 CLINICAL DATA:  Nasogastric  tube placement. EXAM: PORTABLE CHEST 1 VIEW COMPARISON:  Chest radiograph performed 06/20/2017 FINDINGS: The patient's enteric tube is noted extending below the diaphragm. The patient's endotracheal tube is seen ending 5 cm above the carina. The lungs are hypoexpanded. Small bilateral pleural effusions are seen. Bibasilar  airspace opacities may reflect mild interstitial edema or atelectasis. No pneumothorax is seen. The cardiomediastinal silhouette is normal in size. The patient is status post median sternotomy, with evidence of prior CABG. No acute osseous abnormalities are identified. A right IJ line is noted ending about the mid SVC. Cervical spinal fusion hardware is noted. IMPRESSION: 1. Enteric tube noted extending below the diaphragm. 2. Endotracheal tube noted ending 5 cm above the carina. 3. Lungs hypoexpanded. Small bilateral pleural effusions noted. Bibasilar airspace opacities may reflect mild interstitial edema or atelectasis. Electronically Signed   By: Garald Balding M.D.   On: 06/21/2017 06:26   Dg Chest Portable 1 View  Result Date: 06/20/2017 CLINICAL DATA:  Shortness of Breath EXAM: PORTABLE CHEST 1 VIEW COMPARISON:  August 01, 2011 FINDINGS: There is atelectatic change in the bases. The lungs elsewhere are clear. Heart is upper normal in size with pulmonary vascularity within normal limits. Patient is status post coronary artery bypass grafting. There is aortic atherosclerosis. There is postoperative change in the lower cervical spine. There is also postoperative change in the right shoulder. There is degenerative change in each shoulder. IMPRESSION: Bibasilar atelectasis. No edema or consolidation. Heart size upper normal with pulmonary vascularity normal. Status post coronary artery bypass grafting. There is aortic atherosclerosis. Aortic Atherosclerosis (ICD10-I70.0). Electronically Signed   By: Lowella Grip III M.D.   On: 06/20/2017 15:10      Assessment & Plan: Mr. ZUHAYR DEENEY is a 76 y.o. white male with chronic kidney disease stage IV baseline creatinine 2.5-3, coronary artery disease, hypertension, hyperlipidemia, left partial nephrectomy, gout, diabetes mellitus type II insulin dependent, diabetic peripheral neuropathy and osteoarthritis, who was admitted to [facility] on 06/20/2017 for SOB (shortness of breath) [R06.02] Hypoxia [R09.02] Acute renal failure, unspecified acute renal failure type ([place]) [N17.9]  1. Acute renal failure with volume overload, hyperkalemia and metabolic acidosis on chronic kidney disease stage IV with proteinuria. Baseline creatinine 3.37, GFR of 20 on 04/17/17. Followed by Dr. Barry Dienes, [facility].  Patient with uremia, nonoliguric and with volume overload - Discussed dialysis with family - Start CRRT. Orders prepared.   2. Acute Respiratory Failure: status post intubated and mechanical ventilation. Hypercarbic with respiratory acidosis.   3. Anemia with chronic kidney disease: status post PRBC transfusion. Hemoglobin 9.1 (6.5)   LOS: 1 Adarian Bur 11/11/201810:31 AM

## 2017-06-21 NOTE — Progress Notes (Signed)
Dr Sheryle Hailiamond notified of decresed urine output, foley cath orders given and order for cont pulse ox given.

## 2017-06-21 NOTE — Progress Notes (Signed)
Pharmacy Antibiotic Note  Christopher [name] is a 76 y.o. male admitted on 06/20/2017 with acute on CKD/SOB.  Pharmacy has been consulted for Zosyn dosing for aspiration pneumonia.  .  Plan: Zosyn 3.375g IV q12h (4 hour infusion).   Height: 5\' 8"  (172.7 cm) Weight: 230 lb 3.2 oz (104.4 kg) IBW/kg (Calculated) : 68.4  Temp (24hrs), Avg:98 F (36.7 C), Min:97.9 F (36.6 C), Max:98.1 F (36.7 C)  Recent Labs  Lab 06/20/17 1445 06/21/17 0605 06/21/17 0607  WBC 7.1 9.6  --   CREATININE 5.66* 5.82*  --   LATICACIDVEN  --   --  1.0    Estimated Creatinine Clearance: 13 mL/min (A) (by C-G formula based on SCr of 5.82 mg/dL (H)).    Allergies  Allergen Reactions  . Neurontin [Gabapentin] Other (See Comments)    Swelling when taking everyday    Antimicrobials this admission: Zosyn 11/11 >>   Dose adjustments this admission: N/A  Microbiology results: 11/11 BCx: pending x 2 11/11 UCx: pending  11/11 Sputum: pending  11/11 MRSA PCR: negative  Thank you for allowing pharmacy to be a part of this patient's care.  Stormy CardKatsoudas,Cherylene Ferrufino K, Natchitoches Regional Medical CenterRPH 06/21/2017 7:37 AM

## 2017-06-21 NOTE — Procedures (Signed)
Intubation Procedure Note Christopher [name] [id number] May 16, 1941  Procedure: Intubation Indications: Respiratory insufficiency  Procedure Details Consent: Risks of procedure as well as the alternatives and risks of each were explained to the (patient/caregiver).  Consent for procedure obtained. and Unable to obtain consent because of emergent medical necessity. Time Out: Verified patient identification, verified procedure, site/side was marked, verified correct patient position, special equipment/implants available, medications/allergies/relevent history reviewed, required imaging and test results available.  Performed  Maximum sterile technique was used including antiseptics, cap, gloves, gown, hand hygiene, mask and sheet.  MAC and 4    Evaluation Hemodynamic Status: BP stable throughout; O2 sats: stable throughout Patient'[name] Current Condition: stable Complications: No apparent complications Patient did tolerate procedure well. Chest X-ray ordered to verify placement.  CXR: tube position acceptable. Procedure performed under direct supervision of Dr.Kasa. Glidoscope utilized for realtime airway visualization during intubation  Christopher [name]. [facility] ANP-BC Pulmonary and Critical Care Medicine [facility] Pager [phone] or [phone]  06/21/2017

## 2017-06-21 NOTE — Progress Notes (Signed)
Vas cath placed by Dr. Belia HemanKasa in right Femoral.

## 2017-06-21 NOTE — Consult Note (Signed)
PULMONARY / CRITICAL CARE MEDICINE   Name: Christopher [name] MRN: [id number] DOB: 1941-02-18    ADMISSION DATE:  06/20/2017   CONSULTATION DATE: 07/21/2017  REFERRING MD: Dr. Tobi Bastos  Reason: Acute respiratory failure and acute change in mental status  HISTORY OF PRESENT ILLNESS:   This is a 76 year old Caucasian male with a past medical history as indicated below who presented initially with complaints of dyspnea symptoms started 2 days.  History is obtained from the ED records as patient is currently intubated and sedated.  Per ED records, it all started with dyspnea which then progressively gotten worse.  Patient was on his way from a fishing trip when he became so severely dyspneic that he had to call EMS.  When EMS arrived, patient was hypoxic in the 60s.  He was placed on CPAP and brought to the emergency room.  At the emergency room he was transitioned to BiPAP, given IV Lasix and symptoms improved.  Associated symptoms included lower extremity edema.  He was transitioned to 5 L nasal cannula and admitted to the regular floor for further management. His creatinine in the emergency room was 5.6 and his BUN was 156.  He has baseline chronic kidney disease but there is no documentation indicating that he was on hemodialysis. While on the floor, at about 3:54 AM, patient was found unresponsive.  A rapid response was called and he was transferred to the ICU for further management.  A stat ABG showed a pH of 6.94, PCO2 of 87, PO2 of 70, bicarb of 18.7 and an oxygen saturation of 78%.  Patient was emergently intubated.  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  PAST SURGICAL HISTORY: He  has a past surgical history that includes Coronary angioplasty; Rotator cuff repair (1980's); Back surgery; Thoracic discectomy (04/07/2012); Total knee arthroplasty (2005); Joint replacement; Lumbar disc surgery (1970's); Partial nephrectomy (~ 2000); Anterior fusion cervical spine (2005); Incision and drainage of wound (~ 2003); THORACIC DISCECTOMY (N/A, 04/07/2012); and POSTERIOR CERVICAL FUSION/FORAMINOTOMY LEVEL 1 (N/A, 07/25/2011).  Allergies  Allergen Reactions  . Neurontin [Gabapentin] Other (See Comments)    Swelling when taking everyday    No current facility-administered medications on file prior to encounter.    Current Outpatient Medications on File Prior to Encounter  Medication Sig  . allopurinol (ZYLOPRIM) 100 MG tablet Take 1 tablet daily by mouth.  Marland Kitchen amitriptyline (ELAVIL) 10 MG tablet Take 1 tablet (10 mg total) by mouth at bedtime.  Marland Kitchen amLODipine (NORVASC) 10 MG tablet Take 10 mg by mouth daily.    Marland Kitchen aspirin EC 325 MG tablet Take 81 mg by mouth daily.    . furosemide (LASIX) 40 MG tablet Take 1 tablet (40 mg total) by mouth daily.  . insulin NPH Human (HUMULIN N,NOVOLIN N) 100 UNIT/ML injection Inject 10 Units 2 (two) times daily into the skin.  . metoprolol succinate (TOPROL-XL) 100 MG 24 hr tablet Take 25 mg by mouth daily.    . Multiple Vitamin (MULTIVITAMIN) tablet Take 1 tablet by mouth daily.    . Omega-3 Fatty Acids (FISH OIL) 1200 MG CAPS Take 1 capsule by mouth daily.    . potassium  chloride SA (K-DUR,KLOR-CON) 20 MEQ tablet Take 1 tablet (20 mEq total) by mouth daily.  Marland Kitchen. gabapentin (NEURONTIN) 600 MG tablet Take 600 mg by mouth Three times daily as needed. For nerve pain    FAMILY HISTORY:  His has no family status information on file.    SOCIAL HISTORY: He  reports that he quit smoking about 31 years ago. His smoking use included cigarettes. He has a 60.00 pack-year  smoking history. He has quit using smokeless tobacco. His smokeless tobacco use included chew. He reports that he drinks alcohol. He reports that he does not use drugs.  REVIEW OF SYSTEMS:   Unable to obtain as patient is unresponsive  SUBJECTIVE:   VITAL SIGNS: BP (!) 114/56 (BP Location: Left Arm)   Pulse (!) 104   Temp 97.9 F (36.6 C) (Oral)   Resp (!) 22   Ht 5\' 8"  (1.727 m)   Wt 230 lb 3.2 oz (104.4 kg)   SpO2 92%   BMI 35.00 kg/m   HEMODYNAMICS:    VENTILATOR SETTINGS: Vent Mode: PRVC FiO2 (%):  [50 %] 50 % Set Rate:  [25 bmp] 25 bmp Vt Set:  [550 mL] 550 mL PEEP:  [5 cmH20] 5 cmH20  INTAKE / OUTPUT: No intake/output data recorded.  PHYSICAL EXAMINATION: General: Obese, in no acute distress Neuro: Unresponsive to voice touch and noxious stimulus, pupils pinpoint and sluggish HEENT: PERRLA, neck supple, no JVD Cardiovascular: Apical pulse regular, S1-S2, no murmur regurg or gallop, +2 pulses bilaterally, +3 pitting edema bilaterally Lungs: Bilateral breath sounds, no wheezing, bibasilar crackles, breath sounds significantly diminished in the bases bilaterally Abdomen: Obese, normal bowel sounds, palpation reveals no organomegaly Musculoskeletal: No joint deformities, positive range of motion in upper and lower extremities Skin: Warm and dry  LABS:  BMET Recent Labs  Lab 06/20/17 1445  NA 139  K 5.0  CL 108  CO2 19*  BUN 156*  CREATININE 5.66*  GLUCOSE 344*    Electrolytes Recent Labs  Lab 06/20/17 1445  CALCIUM 7.7*    CBC Recent Labs  Lab 06/20/17 1445 06/21/17 0605  WBC 7.1 9.6  HGB 10.4* 6.1*  HCT 32.9* 19.4*  PLT 164 184    Coag's Recent Labs  Lab 06/21/17 0605  APTT 35  INR 1.20    Sepsis Markers Recent Labs  Lab 06/21/17 0607  LATICACIDVEN 1.0    ABG Recent Labs  Lab 06/21/17 0416  PHART 6.94*  PCO2ART 87*  PO2ART 70*    Liver Enzymes Recent Labs  Lab 06/20/17 1445  AST 21  ALT 19  ALKPHOS 86   BILITOT 0.5  ALBUMIN 3.6    Cardiac Enzymes Recent Labs  Lab 06/20/17 1445  TROPONINI <0.03    Glucose Recent Labs  Lab 06/20/17 2115 06/20/17 2351 06/21/17 0450  GLUCAP 249* 196* 269*    Imaging Dg Abd 1 View  Result Date: 06/21/2017 CLINICAL DATA:  Nasogastric tube placement. EXAM: ABDOMEN - 1 VIEW COMPARISON:  Lumbar spine radiographs performed 04/17/2017 FINDINGS: The patient's enteric tube is noted ending overlying the body of the stomach. The visualized bowel gas pattern is grossly unremarkable. No free intra-abdominal air is seen, though evaluation for free air is limited on a single supine view. Mild degenerative change is noted along the lumbar spine. The patient is status post median sternotomy. IMPRESSION: Enteric tube noted ending overlying the body of the stomach. Electronically Signed   By: Roanna RaiderJeffery  Chang M.D.   On: 06/21/2017 06:27   Dg  Chest Port 1 View  Result Date: 06/21/2017 CLINICAL DATA:  Nasogastric tube placement. EXAM: PORTABLE CHEST 1 VIEW COMPARISON:  Chest radiograph performed 06/20/2017 FINDINGS: The patient's enteric tube is noted extending below the diaphragm. The patient's endotracheal tube is seen ending 5 cm above the carina. The lungs are hypoexpanded. Small bilateral pleural effusions are seen. Bibasilar airspace opacities may reflect mild interstitial edema or atelectasis. No pneumothorax is seen. The cardiomediastinal silhouette is normal in size. The patient is status post median sternotomy, with evidence of prior CABG. No acute osseous abnormalities are identified. A right IJ line is noted ending about the mid SVC. Cervical spinal fusion hardware is noted. IMPRESSION: 1. Enteric tube noted extending below the diaphragm. 2. Endotracheal tube noted ending 5 cm above the carina. 3. Lungs hypoexpanded. Small bilateral pleural effusions noted. Bibasilar airspace opacities may reflect mild interstitial edema or atelectasis. Electronically Signed   By:  Roanna Raider M.D.   On: 06/21/2017 06:26   Dg Chest Portable 1 View  Result Date: 06/20/2017 CLINICAL DATA:  Shortness of Breath EXAM: PORTABLE CHEST 1 VIEW COMPARISON:  August 01, 2011 FINDINGS: There is atelectatic change in the bases. The lungs elsewhere are clear. Heart is upper normal in size with pulmonary vascularity within normal limits. Patient is status post coronary artery bypass grafting. There is aortic atherosclerosis. There is postoperative change in the lower cervical spine. There is also postoperative change in the right shoulder. There is degenerative change in each shoulder. IMPRESSION: Bibasilar atelectasis. No edema or consolidation. Heart size upper normal with pulmonary vascularity normal. Status post coronary artery bypass grafting. There is aortic atherosclerosis. Aortic Atherosclerosis (ICD10-I70.0). Electronically Signed   By: Bretta Bang III M.D.   On: 06/20/2017 15:10   STUDIES:  2D echo pending  CULTURES: Blood cultures x2 Urine culture MRSA screen  ANTIBIOTICS: Zosyn for aspiration pneumonia  SIGNIFICANT EVENTS: June 20, 2017: Admitted June 21, 2017: Transferred to the ICU  LINES/TUBES: ET tube Peripheral IVs Right IJ  DISCUSSION: This is a 76 year old Caucasian male with a significant medical history presenting in cardiorenal syndrome with acute hypercarbic and hypoxemic respiratory failure , acute on chronic renal failure and acute CHF edema  ASSESSMENT / PLAN:  PULMONARY A: Acute hypoxic/hypercarbic respiratory failure Acute pulmonary edema P:    Emergently intubated. Continue full vent support with current settings. ABG post intubation reviewed. Chest x-ray and ABG as needed. Nebulized bronchodilators. IV diuresis  CARDIOVASCULAR A:  Acute CHF exacerbation-last 2-D echo was in December 2012 and showed an EF of 50-55% Worsening lower extremity edema. History of coronary artery disease status post MI. History of  hypertension History of hyperlipidemia P:  Hemodynamics per ICU protocol. Cycle cardiac enzymes  IV Lasix. 2-D echo [facility] consider cardiology input if patient continues to deteriorate. Resume all home medications  RENAL A:   Acute on chronic renal failure. Elevated BUN Hyperkalemia Hypomagnesemia. Hypophosphatemia P:   Nephrology consulted; awaiting input; patient will most likely require dialysis BiCAP pushes as ordered. IV diuresis Monitor and correct electrolyte imbalances  GASTROINTESTINAL A:   Keep nothing by mouth P:   Protonix for GI prophylaxis  HEMATOLOGIC A:   Acute anemia-hemoglobin down to 6.1 from 10.4 ; etiology unclear P:  Stat CBC to confirm hemoglobin level Transfuse as needed for hemoglobin less than 7. DVT prophylaxis with subcutaneous heparin  ENDOCRINE A:   Uncontrolled type 2 diabetes  P:   Blood glucose monitoring with aggressive sliding scale coverage   NEUROLOGIC A:  Acute encephalopathy secondary to hypercarbia and renal failure P:   RASS goal: -1 to -2 Propofol for vent sedation and discomfort   FAMILY  - Updates: Patient's wife updated by phone.  Patient is currently a full code  - Inter-disciplinary family meet or Palliative Care meeting due by: day 7   S. [facility] ANP-BC Pulmonary and Critical Care Medicine [facility] Pager [phone] or [phone]  06/21/2017, 6:46 AM

## 2017-06-21 NOTE — Progress Notes (Signed)
[facility] [place] [facility]   PATIENT NAME: Christopher GambleRalph [name]    MR#:  [id number]  DATE OF BIRTH:  03-16-1941  SUBJECTIVE:  CHIEF COMPLAINT:   Chief Complaint  Patient presents with  . Shortness of Breath    Came with respi distress, had pulm edema, intubated in night and started on HD as have worsened renal func.  REVIEW OF SYSTEMS:   Intubated.  ROS  DRUG ALLERGIES:   Allergies  Allergen Reactions  . Neurontin [Gabapentin] Other (See Comments)    Swelling when taking everyday    VITALS:  Blood pressure 108/67, pulse 89, temperature 97.9 F (36.6 C), temperature source Oral, resp. rate (!) 25, height 5\' 8"  (1.727 m), weight 104.4 kg (230 lb 3.2 oz), SpO2 93 %.  PHYSICAL EXAMINATION:  GENERAL:  76 y.o.-year-old patient lying in the bed with no acute distress.  EYES: Pupils equal, round, reactive to light and accommodation. No scleral icterus. Extraocular muscles intact.  HEENT: Head atraumatic, normocephalic. Oropharynx and nasopharynx clear. ETT in place. NECK:  Supple, no jugular venous distention. No thyroid enlargement, no tenderness.  LUNGS: Normal breath sounds bilaterally, no wheezing, rales,rhonchi or crepitation. No use of accessory muscles of respiration. ETT on vent support. CARDIOVASCULAR: S1, S2 normal. No murmurs, rubs, or gallops.  ABDOMEN: Soft, nontender, nondistended. Bowel sounds present. No organomegaly or mass.  EXTREMITIES: b/l pedal edema,no cyanosis, or clubbing. Right groin HD cath in place with CRRT. NEUROLOGIC: Intubated on vent support.  PSYCHIATRIC: The patient is on vent.  SKIN: No obvious rash, lesion, or ulcer.   Physical Exam LABORATORY PANEL:   CBC Recent Labs  Lab 06/21/17 0700  WBC 7.2  HGB 9.5*  HCT 29.8*  PLT 150   ------------------------------------------------------------------------------------------------------------------  Chemistries  Recent Labs  Lab 06/21/17 0605  NA 139  K 5.9*  CL 112*   CO2 18*  GLUCOSE 276*  BUN 150*  CREATININE 5.82*  CALCIUM 7.2*  MG 2.5*  AST 14*  ALT 17  ALKPHOS 75  BILITOT 0.5   ------------------------------------------------------------------------------------------------------------------  Cardiac Enzymes Recent Labs  Lab 06/20/17 1445 06/21/17 0608  TROPONINI <0.03 0.03*   ------------------------------------------------------------------------------------------------------------------  RADIOLOGY:  Dg Abd 1 View  Result Date: 06/21/2017 CLINICAL DATA:  Nasogastric tube placement. EXAM: ABDOMEN - 1 VIEW COMPARISON:  Lumbar spine radiographs performed 04/17/2017 FINDINGS: The patient's enteric tube is noted ending overlying the body of the stomach. The visualized bowel gas pattern is grossly unremarkable. No free intra-abdominal air is seen, though evaluation for free air is limited on a single supine view. Mild degenerative change is noted along the lumbar spine. The patient is status post median sternotomy. IMPRESSION: Enteric tube noted ending overlying the body of the stomach. Electronically Signed   By: Roanna RaiderJeffery  Chang M.D.   On: 06/21/2017 06:27   [place] Renal  Result Date: 06/21/2017 CLINICAL DATA:  Acute renal failure EXAM: RENAL ULTRASOUND COMPARISON:  None. FINDINGS: Right Kidney: Length: 12.2 cm. Echogenicity and renal cortical thickness are within normal limits. No perinephric fluid or hydronephrosis visualized. There is a cyst in the mid right kidney measuring 2.1 x 2.3 x 2.2 cm. There is a second nearby cyst measuring 1.8 x 1.5 x 1.5 cm. No sonographically demonstrable calculus or ureterectasis. Left Kidney: Length: 12.3 cm. Echogenicity and renal cortical thickness are within normal limits. No perinephric fluid or hydronephrosis visualized. There is a cyst in the lower pole left kidney measuring 2.2 x 2.5 x 2.4 cm. There is an adjacent lower pole  left renal cyst measuring 2.4 x 1.8 x 1.8 cm. There is a smaller cyst more superiorly  on the left measuring 1.4 x 1.4 x 1.4 cm. No sonographically demonstrable calculus or ureterectasis. Bladder: Foley catheter in place. Urinary bladder empty and cannot be assessed. IMPRESSION: There are small renal cysts bilaterally. No noncystic renal masses evident. No obstructing focus in either kidney. The renal cortical thickness and echogenicity are normal bilaterally. Urinary bladder decompressed and cannot be assessed. Electronically Signed   By: Bretta Bang III M.D.   On: 06/21/2017 11:28   Dg Chest Port 1 View  Result Date: 06/21/2017 CLINICAL DATA:  Nasogastric tube placement. EXAM: PORTABLE CHEST 1 VIEW COMPARISON:  Chest radiograph performed 06/20/2017 FINDINGS: The patient's enteric tube is noted extending below the diaphragm. The patient's endotracheal tube is seen ending 5 cm above the carina. The lungs are hypoexpanded. Small bilateral pleural effusions are seen. Bibasilar airspace opacities may reflect mild interstitial edema or atelectasis. No pneumothorax is seen. The cardiomediastinal silhouette is normal in size. The patient is status post median sternotomy, with evidence of prior CABG. No acute osseous abnormalities are identified. A right IJ line is noted ending about the mid SVC. Cervical spinal fusion hardware is noted. IMPRESSION: 1. Enteric tube noted extending below the diaphragm. 2. Endotracheal tube noted ending 5 cm above the carina. 3. Lungs hypoexpanded. Small bilateral pleural effusions noted. Bibasilar airspace opacities may reflect mild interstitial edema or atelectasis. Electronically Signed   By: Roanna Raider M.D.   On: 06/21/2017 06:26   Dg Chest Portable 1 View  Result Date: 06/20/2017 CLINICAL DATA:  Shortness of Breath EXAM: PORTABLE CHEST 1 VIEW COMPARISON:  August 01, 2011 FINDINGS: There is atelectatic change in the bases. The lungs elsewhere are clear. Heart is upper normal in size with pulmonary vascularity within normal limits. Patient is status  post coronary artery bypass grafting. There is aortic atherosclerosis. There is postoperative change in the lower cervical spine. There is also postoperative change in the right shoulder. There is degenerative change in each shoulder. IMPRESSION: Bibasilar atelectasis. No edema or consolidation. Heart size upper normal with pulmonary vascularity normal. Status post coronary artery bypass grafting. There is aortic atherosclerosis. Aortic Atherosclerosis (ICD10-I70.0). Electronically Signed   By: Bretta Bang III M.D.   On: 06/20/2017 15:10    ASSESSMENT AND PLAN:   Active Problems:   Acute renal failure superimposed on stage 4 chronic kidney disease (HCC)   Acute respiratory failure (HCC)   Acute renal failure (HCC)  1.  Ac respi failure due to Ac diastolic CHF   Fluid overload due to Renal failure: Acute on chronic; end-stage renal disease at this time.  Patient is fluid overloaded resulting in hypoxia.   Appreciated nephro help to initiate dialysis.     Intubated on vent support. 2.  Diabetes mellitus type 2: Continue NPH per home regimen.  Gabapentin for neuropathic pain 3.  Coronary artery disease: Stable: Continue aspirin 4.  Hypertension: Controlled; continue amlodipine and metoprolol. 5.  Gout: Controlled; hold allopurinol 6.  DVT prophylaxis: Heparin 7.  GI prophylaxis: None   All the records are reviewed and case discussed with Care Management/Social Workerr. Management plans discussed with the patient, family and they are in agreement.  CODE STATUS: Full.  TOTAL TIME TAKING CARE OF THIS PATIENT: 35 minutes.   Discussed with wife and grand daughter in room.  POSSIBLE D/C IN 2-3 DAYS, DEPENDING ON CLINICAL CONDITION.   Altamese Dilling M.D on 06/21/2017  Between 7am to 6pm - Pager - [phone]  After 6pm go to [email or web address] - password [email or web address]  [facility]  Office  [phone]  CC: Primary care physician; System, Provider Not  In  Note: This dictation was prepared with Dragon dictation along with smaller phrase technology. Any transcriptional errors that result from this process are unintentional.

## 2017-06-21 NOTE — Progress Notes (Signed)
Patient received as rapid response due to decreased LOC.   ABG done, Patient was emergency intubated and cental line inserted per M. Luci Bankukov, NP.  Propofol added for sedation.

## 2017-06-21 NOTE — Progress Notes (Signed)
eLink Physician-Brief Progress Note Patient Name: Maralyn SagoRalph T Feltner DOB: 1941/05/23 MRN: [id number]   Date of Service  06/21/2017  HPI/Events of Note  76 year old male admitted for acute on chronic kidney disease.  Patient is fluid overloaded resulting in hypoxia.  We will try to diurese we may need to initiate dialysis.  Nephrology consulted. Now on BiPAP. VSS.    eICU Interventions  No new orders.      Intervention Category Evaluation Type: New Patient Evaluation  Lenell AntuSommer,Brande Uncapher Eugene 06/21/2017, 4:57 AM

## 2017-06-21 NOTE — Progress Notes (Signed)
Called by RN for low oxygen saturation Rapid response called Patient on ventimask and oxygen saturation less than 80% Has decreased response to verbal stimuli ABG reviewed Patient will be transfered to step down unit and put on bipap ICU critical care nurse practitioner informed regarding transfer of patient

## 2017-06-21 NOTE — Progress Notes (Signed)
New Cartridge loaded into CRRT. Previous cartridge clotted.

## 2017-06-21 NOTE — Procedures (Signed)
Central Venous Catheter Insertion Procedure Note Christopher [name] [id number] 1940-11-01  Procedure: Insertion of Central Venous Catheter Indications: Assessment of intravascular volume, Drug and/or fluid administration and Frequent blood sampling  Procedure Details Consent: Risks of procedure as well as the alternatives and risks of each were explained to the (patient/caregiver).  Consent for procedure obtained. and Unable to obtain consent because of emergent medical necessity. Time Out: Verified patient identification, verified procedure, site/side was marked, verified correct patient position, special equipment/implants available, medications/allergies/relevent history reviewed, required imaging and test results available.  Performed  Maximum sterile technique was used including antiseptics, cap, gloves, gown, hand hygiene, mask and sheet. Skin prep: Chlorhexidine; local anesthetic administered A antimicrobial bonded/coated triple lumen catheter was placed in the right internal jugular vein using the Seldinger technique.  Evaluation Blood flow good Complications: No apparent complications Patient did tolerate procedure well. Chest X-ray ordered to verify placement.  CXR: normal. Procedure performed under direct supervision of Dr.Kasa.  Ultrasound utilized for realtime vessel cannulation  Christopher [name] S. [name] ANP-BC Pulmonary and Critical Care Medicine [facility] Pager [phone] or [phone]  06/21/2017, 8:18 AM

## 2017-06-21 NOTE — Significant Event (Signed)
Rapid Response Event Note  Overview: Time Called: 0506 Arrival Time: 0510 Event Type: Respiratory, Neurologic  Initial Focused Assessment:   Interventions: ABG taken MD notified, this RN at beside to assess. MD notified orders received  Plan of Care (if not transferred): Patient transferred to ICU, subsequently Intubated  Event Summary: Name of Physician Notified: Pyreddy at    Name of Consulting Physician Notified: Dellis AnesMagdeline Tukov NP at [phone]  Outcome: Transferred (Comment)     Jodelle Greenaylor,Konnor Jorden Y

## 2017-06-21 NOTE — Progress Notes (Signed)
Initial Nutrition Assessment  DOCUMENTATION CODES:   Obesity unspecified  INTERVENTION:  Recommend initiating Vital High protein at 10 mL/hr (240 mL goal daily volume) + Pro-Stat 60 mL QID. Goal regimen provides 1040 kcal, 141 grams of protein, 202 mL H2O daily. With current propofol rate provides 1703 kcal daily.  Recommend providing liquid MVI per tube daily as goal TF regimen does not meet 100% RDIs for vitamins/minerals.  Recommend discontinuing Lovaza (omega-3) at this time as patient is critically ill.  NUTRITION DIAGNOSIS:   Inadequate oral intake related to inability to eat as evidenced by NPO status(patient currently intubated).  GOAL:   Provide needs based on ASPEN/SCCM guidelines  MONITOR:   Vent status, Labs, Weight trends, TF tolerance, I & O's  REASON FOR ASSESSMENT:   Ventilator    ASSESSMENT:   76 year old male with PMHx of HTN, anxiety, CAD, hx MI 2003, HLD, DM type 2, chronic back pain, chronic kidney disease stage IV, hx partial nephrectomy in 2000 who presented with shortness of breath found to have acute on chronic kidney disease with volume overload resulting in hypoxia. Rapid response was called 11/11 due to low oxygen saturation and decreased responsiveness and was emergently intubated.   -Double lumen central venous dialysis catheter was placed 11/11 for initiation of renal replacement therapy. Plan is for CVVHD today.  Patient's wife and daughter were at bedside during RD assessment. They report patient typically eats very well. His weight fluctuates with volume, but they believe his dry weight is in the low 200 lbs range. Per review of weights in Care Everywhere patient was 208 lbs (94.5 kg) [facility] and per documentation patient did not have any swelling/edema. This is likely close to patient's dry weight and will use to estimate needs at this time.  Access: OGT that terminates in body of stomach per  abdominal x-ray today; details of tube not yet documented  MAP: 64-92 mmHg  Patient is currently intubated on ventilator support MV: 13.2 L/min Temp (24hrs), Avg:98 F (36.7 C), Min:97.9 F (36.6 C), Max:98.1 F (36.7 C)  Propofol: 25.1 ml/hr (663 kcal daily)  Medications reviewed and include: Colace, Lasix 40 mg Q12hrs IV, Novolog 0-15 units Q4hrs, Humulin 10 units BID, Humulin 5 units QHS, MVI daily PO, Lovaza 1 gram BID PO, pantoprazole, Levophed gtt, Zosyn, propofol gtt.  Labs reviewed: CBG 196-271, Triglycerides 152, Potassium 5.9, Chloride 112, CO2 18, BUN 150, Creatinine 5.82, Phosphorus 10.1, Magnesium 2.5.  Weight trend: 104.4 kg on 11/11 and 100.9 kg on 11/10; falsely elevated in setting of volume overload  Patient does not meet criteria for malnutrition at this time.  Discussed with RN. Plan is for initiation of CVVHD today. Also discussed with Intensivist. Plan is to initiate tube feeds today.  NUTRITION - FOCUSED PHYSICAL EXAM:    Most Recent Value  Orbital Region  No depletion  Upper Arm Region  No depletion  Thoracic and Lumbar Region  No depletion  Buccal Region  Unable to assess  Temple Region  No depletion  Clavicle Bone Region  No depletion  Clavicle and Acromion Bone Region  No depletion  Scapular Bone Region  No depletion  Dorsal Hand  No depletion  Patellar Region  No depletion  Anterior Thigh Region  No depletion  Posterior Calf Region  No depletion  Edema (RD Assessment)  Moderate [to bilateral lower extremities]  Hair  Reviewed  Eyes  Unable to assess  Mouth  Unable to  assess  Skin  Reviewed  Nails  Reviewed     Diet Order:  Diet NPO time specified  EDUCATION NEEDS:   No education needs have been identified at this time  Skin:  Skin Assessment: Reviewed RN Assessment(abrasions to legs and knees)  Last BM:  06/20/2017  Height:   Ht Readings from Last 1 Encounters:  06/20/17 5\' 8"  (1.727 m)    Weight:   Wt Readings from Last 1  Encounters:  06/21/17 230 lb 3.2 oz (104.4 kg)    Ideal Body Weight:  70 kg  BMI:  Body mass index is 35 kg/m. With weight of 94.5 kg from 10/8 BMI is 31.7 kg/m2  Estimated Nutritional Needs:   Kcal:  1040-1323 (11-14 kcal/kg)  Protein:  140-175 grams (2-2.5 grams/kg IBW for CVVHD)  Fluid:  1.7-2.1 L/day (25-30 mL/kg IBW)   PSU 2010 comes to 2061 kcal with MSJ 1664, Ve 13.2, Tmax 36.7 (70% is 1443 kcal).  Helane RimaLeanne Ralphine Hinks, MS, RD, LDN Office: [phone] Pager: [phone] After Hours/Weekend Pager: [phone]

## 2017-06-21 NOTE — Procedures (Signed)
Central Venous Dailysis Catheter Placement: DOUBLE LUMEN  Indication: Hemo Dialysis/CRRT   Consent:emergent   Hand washing performed prior to starting the procedure.   Procedure: An active timeout was performed and correct patient, name, & ID confirmed. Patient was positioned correctly for central venous access. Patient was prepped using strict sterile technique including chlorohexadine preps, sterile drape, sterile gown and sterile gloves.  The area was prepped, draped and anesthetized in the usual sterile manner. Patient comfort was obtained.    A Double lumen catheter was placed in RT femoral Vein There was good blood return, catheter caps were placed on lumens, catheter flushed easily, the line was secured and a sterile dressing and BIO-PATCH applied.   Ultrasound was used to visualize vasculature and guidance of needle.   Number of Attempts: 1 Complications:none  Estimated Blood Loss: none Operator: Noell Shular.   Christopher [name] Christopher [name], M.D.  [facility]  Medical Director [facility] Medical Director [facility] Cardio-Pulmonary Department     

## 2017-06-22 ENCOUNTER — Inpatient Hospital Stay: Payer: Medicare HMO

## 2017-06-22 DIAGNOSIS — N184 Chronic kidney disease, stage 4 (severe): Secondary | ICD-10-CM

## 2017-06-22 DIAGNOSIS — N171 Acute kidney failure with acute cortical necrosis: Secondary | ICD-10-CM

## 2017-06-22 LAB — RENAL FUNCTION PANEL
ALBUMIN: 2.9 g/dL — AB (ref 3.5–5.0)
ALBUMIN: 3.1 g/dL — AB (ref 3.5–5.0)
ALBUMIN: 2.8 g/dL — AB (ref 3.5–5.0)
ALBUMIN: 2.7 g/dL — AB (ref 3.5–5.0)
ANION GAP: 11 (ref 5–15)
ANION GAP: 10 (ref 5–15)
ANION GAP: 11 (ref 5–15)
Albumin: 3 g/dL — ABNORMAL LOW (ref 3.5–5.0)
Albumin: 3.1 g/dL — ABNORMAL LOW (ref 3.5–5.0)
Anion gap: 10 (ref 5–15)
Anion gap: 9 (ref 5–15)
Anion gap: 9 (ref 5–15)
BUN: 111 mg/dL — ABNORMAL HIGH (ref 6–20)
BUN: 80 mg/dL — ABNORMAL HIGH (ref 6–20)
BUN: 81 mg/dL — AB (ref 6–20)
BUN: 84 mg/dL — ABNORMAL HIGH (ref 6–20)
BUN: 93 mg/dL — AB (ref 6–20)
BUN: 99 mg/dL — ABNORMAL HIGH (ref 6–20)
CALCIUM: 7.1 mg/dL — AB (ref 8.9–10.3)
CALCIUM: 7.7 mg/dL — AB (ref 8.9–10.3)
CALCIUM: 7.7 mg/dL — AB (ref 8.9–10.3)
CALCIUM: 7.5 mg/dL — AB (ref 8.9–10.3)
CHLORIDE: 105 mmol/L (ref 101–111)
CHLORIDE: 107 mmol/L (ref 101–111)
CHLORIDE: 107 mmol/L (ref 101–111)
CHLORIDE: 107 mmol/L (ref 101–111)
CO2: 22 mmol/L (ref 22–32)
CO2: 23 mmol/L (ref 22–32)
CO2: 23 mmol/L (ref 22–32)
CO2: 23 mmol/L (ref 22–32)
CO2: 23 mmol/L (ref 22–32)
CO2: 23 mmol/L (ref 22–32)
CREATININE: 2.82 mg/dL — AB (ref 0.61–1.24)
CREATININE: 3.52 mg/dL — AB (ref 0.61–1.24)
CREATININE: 3.91 mg/dL — AB (ref 0.61–1.24)
Calcium: 7.7 mg/dL — ABNORMAL LOW (ref 8.9–10.3)
Calcium: 7.8 mg/dL — ABNORMAL LOW (ref 8.9–10.3)
Chloride: 107 mmol/L (ref 101–111)
Chloride: 106 mmol/L (ref 101–111)
Creatinine, Ser: 3.7 mg/dL — ABNORMAL HIGH (ref 0.61–1.24)
Creatinine, Ser: 3 mg/dL — ABNORMAL HIGH (ref 0.61–1.24)
Creatinine, Ser: 2.83 mg/dL — ABNORMAL HIGH (ref 0.61–1.24)
GFR calc Af Amer: 22 mL/min — ABNORMAL LOW (ref >60)
GFR calc Af Amer: 24 mL/min — ABNORMAL LOW (ref >60)
GFR calc non Af Amer: 16 mL/min — ABNORMAL LOW (ref >60)
GFR calc non Af Amer: 19 mL/min — ABNORMAL LOW (ref >60)
GFR, EST AFRICAN AMERICAN: 16 mL/min — AB (ref >60)
GFR, EST AFRICAN AMERICAN: 17 mL/min — AB (ref >60)
GFR, EST AFRICAN AMERICAN: 18 mL/min — AB (ref >60)
GFR, EST AFRICAN AMERICAN: 24 mL/min — AB (ref >60)
GFR, EST NON AFRICAN AMERICAN: 14 mL/min — AB (ref >60)
GFR, EST NON AFRICAN AMERICAN: 15 mL/min — AB (ref >60)
GFR, EST NON AFRICAN AMERICAN: 20 mL/min — AB (ref >60)
GFR, EST NON AFRICAN AMERICAN: 21 mL/min — AB (ref >60)
GLUCOSE: 172 mg/dL — AB (ref 65–99)
GLUCOSE: 155 mg/dL — AB (ref 65–99)
Glucose, Bld: 145 mg/dL — ABNORMAL HIGH (ref 65–99)
Glucose, Bld: 160 mg/dL — ABNORMAL HIGH (ref 65–99)
Glucose, Bld: 176 mg/dL — ABNORMAL HIGH (ref 65–99)
Glucose, Bld: 179 mg/dL — ABNORMAL HIGH (ref 65–99)
PHOSPHORUS: 5 mg/dL — AB (ref 2.5–4.6)
PHOSPHORUS: 4.1 mg/dL (ref 2.5–4.6)
PHOSPHORUS: 3.6 mg/dL (ref 2.5–4.6)
POTASSIUM: 3.6 mmol/L (ref 3.5–5.1)
POTASSIUM: 3.9 mmol/L (ref 3.5–5.1)
POTASSIUM: 3.9 mmol/L (ref 3.5–5.1)
Phosphorus: 4.1 mg/dL (ref 2.5–4.6)
Phosphorus: 5.2 mg/dL — ABNORMAL HIGH (ref 2.5–4.6)
Phosphorus: 5.4 mg/dL — ABNORMAL HIGH (ref 2.5–4.6)
Potassium: 4.1 mmol/L (ref 3.5–5.1)
Potassium: 4.1 mmol/L (ref 3.5–5.1)
Potassium: 4 mmol/L (ref 3.5–5.1)
SODIUM: 140 mmol/L (ref 135–145)
SODIUM: 139 mmol/L (ref 135–145)
SODIUM: 138 mmol/L (ref 135–145)
Sodium: 139 mmol/L (ref 135–145)
Sodium: 140 mmol/L (ref 135–145)
Sodium: 140 mmol/L (ref 135–145)

## 2017-06-22 LAB — BLOOD GAS, ARTERIAL
ACID-BASE DEFICIT: 14.4 mmol/L — AB (ref 0.0–2.0)
Bicarbonate: 18.7 mmol/L — ABNORMAL LOW (ref 20.0–28.0)
FIO2: 1
O2 SAT: 78.8 %
PCO2 ART: 87 mmHg — AB (ref 32.0–48.0)
PO2 ART: 70 mmHg — AB (ref 83.0–108.0)
Patient temperature: 37
pH, Arterial: 6.94 — CL (ref 7.350–7.450)

## 2017-06-22 LAB — GLUCOSE, CAPILLARY
Glucose-Capillary: 144 mg/dL — ABNORMAL HIGH (ref 65–99)
Glucose-Capillary: 152 mg/dL — ABNORMAL HIGH (ref 65–99)
Glucose-Capillary: 156 mg/dL — ABNORMAL HIGH (ref 65–99)
Glucose-Capillary: 161 mg/dL — ABNORMAL HIGH (ref 65–99)
Glucose-Capillary: 165 mg/dL — ABNORMAL HIGH (ref 65–99)
Glucose-Capillary: 174 mg/dL — ABNORMAL HIGH (ref 65–99)
Glucose-Capillary: 195 mg/dL — ABNORMAL HIGH (ref 65–99)

## 2017-06-22 LAB — MAGNESIUM
MAGNESIUM: 2 mg/dL (ref 1.7–2.4)
Magnesium: 1.8 mg/dL (ref 1.7–2.4)
Magnesium: 1.9 mg/dL (ref 1.7–2.4)
Magnesium: 1.9 mg/dL (ref 1.7–2.4)
Magnesium: 2.1 mg/dL (ref 1.7–2.4)
Magnesium: 2.2 mg/dL (ref 1.7–2.4)

## 2017-06-22 LAB — APTT: aPTT: 37 seconds — ABNORMAL HIGH (ref 24–36)

## 2017-06-22 LAB — PROCALCITONIN: PROCALCITONIN: 1.01 ng/mL

## 2017-06-22 MED ORDER — STERILE WATER FOR INJECTION IJ SOLN
INTRAMUSCULAR | Status: AC
  Administered 2017-06-22: 10 mL
  Filled 2017-06-22: qty 10

## 2017-06-22 MED ORDER — ASPIRIN 81 MG PO CHEW
81.0000 mg | CHEWABLE_TABLET | Freq: Every day | ORAL | Status: DC
  Administered 2017-06-22: 81 mg via ORAL
  Filled 2017-06-22: qty 1

## 2017-06-22 MED ORDER — ALTEPLASE 2 MG IJ SOLR
2.0000 mg | Freq: Once | INTRAMUSCULAR | Status: AC
  Administered 2017-06-22: 2 mg
  Filled 2017-06-22: qty 2

## 2017-06-22 MED ORDER — OXYCODONE HCL 5 MG PO TABS
5.0000 mg | ORAL_TABLET | Freq: Two times a day (BID) | ORAL | Status: DC
  Administered 2017-06-22 (×2): 5 mg via ORAL
  Filled 2017-06-22 (×2): qty 1

## 2017-06-22 MED ORDER — SODIUM CHLORIDE 0.9 % IV SOLN
3.0000 g | Freq: Three times a day (TID) | INTRAVENOUS | Status: DC
  Administered 2017-06-22 – 2017-06-23 (×2): 3 g via INTRAVENOUS
  Filled 2017-06-22 (×4): qty 3

## 2017-06-22 MED ORDER — STERILE WATER FOR INJECTION IJ SOLN
INTRAMUSCULAR | Status: AC
  Administered 2017-06-22: 2.2 mL
  Filled 2017-06-22: qty 10

## 2017-06-22 NOTE — Progress Notes (Signed)
Venous port flushes and has great blood return. CRRT restarted.

## 2017-06-22 NOTE — Consult Note (Signed)
PULMONARY / CRITICAL CARE MEDICINE   Name: Maralyn SagoRalph T Mccreery MRN: [id number] DOB: [birth year]    ADMISSION DATE:  06/20/2017   CONSULTATION DATE: 07/21/2017  REFERRING MD: Dr. Tobi BastosPyreddy  Reason: Acute respiratory failure and acute change in mental status  HISTORY OF PRESENT ILLNESS:   This is a 76 year old Caucasian male with a past medical history as indicated below who presented initially with complaints of dyspnea symptoms started 2 days.  History is obtained from the ED records as patient is currently intubated and sedated.  Per ED records, it all started with dyspnea which then progressively gotten worse.  Patient was on his way from a fishing trip when he became so severely dyspneic that he had to call EMS.  When EMS arrived, patient was hypoxic in the 60s.  He was placed on CPAP and brought to the emergency room.  At the emergency room he was transitioned to BiPAP, given IV Lasix and symptoms improved.  Associated symptoms included lower extremity edema.  He was transitioned to 5 L nasal cannula and admitted to the regular floor for further management. His creatinine in the emergency room was 5.6 and his BUN was 156.  He has baseline chronic kidney disease but there is no documentation indicating that he was on hemodialysis. While on the floor, at about 3:54 AM, patient was found unresponsive.  A rapid response was called and he was transferred to the ICU for further management.  A stat ABG showed a pH of 6.94, PCO2 of 87, PO2 of 70, bicarb of 18.7 and an oxygen saturation of 78%.  Patient was emergently intubated.    SUBJECTIVE Patient sedated, intubated On vasopressors CRRT started Remains critically ill Failed SAT trial   REVIEW OF SYSTEMS:   Unable to obtain as patient is unresponsive  SUBJECTIVE:   VITAL SIGNS: BP (!) 145/99   Pulse (!) 117   Temp 97.8 F (36.6 C) (Axillary)   Resp (!) 21   Ht 5\' 8"  (1.727 m)   Wt 224 lb 13.9 oz (102 kg)   SpO2 92%   BMI 34.19 kg/m    HEMODYNAMICS: CVP:  [8 mmHg-23 mmHg] 16 mmHg  VENTILATOR SETTINGS: Vent Mode: PRVC FiO2 (%):  [40 %-50 %] 40 % Set Rate:  [25 bmp] 25 bmp Vt Set:  [550 mL] 550 mL PEEP:  [5 cmH20] 5 cmH20 Pressure Support:  [5 cmH20] 5 cmH20  INTAKE / OUTPUT: I/O last 3 completed shifts: In: 1131.7 [I.V.:1031.7; IV Piggyback:100] Out: 1275 [Urine:1275]   PHYSICAL EXAMINATION: General: Obese, +resp distress Neuro: Unresponsive, obtunded GCS<8T HEENT: PERRLA, neck supple, no JVD Cardiovascular: Apical pulse regular, S1-S2, no murmur regurg or gallop, +2 pulses bilaterally, +3 pitting edema bilaterally Lungs: Bilateral breath sounds, no wheezing, bibasilar crackles, breath sounds significantly diminished in the bases bilaterally Abdomen: Obese, normal bowel sounds, palpation reveals no organomegaly Musculoskeletal: No joint deformities, positive range of motion in upper and lower extremities Skin: Warm and dry    LABS:  BMET Recent Labs  Lab 06/21/17 2016 06/22/17 0012 06/22/17 0427  NA 140 140 140  K 4.0 3.9 3.6  CL 107 107 107  CO2 20* 23 22  BUN 113* 111* 99*  CREATININE 4.15* 3.91* 3.70*  GLUCOSE 150* 179* 176*    Electrolytes Recent Labs  Lab 06/21/17 2016 06/22/17 0012 06/22/17 0427  CALCIUM 7.5* 7.8* 7.1*  MG 2.3 2.2 2.1  PHOS 5.9* 5.2* 5.4*    CBC Recent Labs  Lab 06/20/17 1445 06/21/17 0605 06/21/17 0700  WBC 7.1 9.6 7.2  HGB 10.4* 6.1* 9.5*  HCT 32.9* 19.4* 29.8*  PLT 164 184 150    Coag's Recent Labs  Lab 06/21/17 0605 06/22/17 0427  APTT 35 37*  INR 1.20  --     Sepsis Markers Recent Labs  Lab 06/21/17 0607  LATICACIDVEN 1.0    ABG Recent Labs  Lab 06/21/17 0416 06/21/17 0608  PHART 6.94* 7.19*  PCO2ART 87* 40  PO2ART 70* 71*    Liver Enzymes Recent Labs  Lab 06/20/17 1445 06/21/17 0605  06/21/17 2016 06/22/17 0012 06/22/17 0427  AST 21 14*  --   --   --   --   ALT 19 17  --   --   --   --   ALKPHOS 86 75  --   --    --   --   BILITOT 0.5 0.5  --   --   --   --   ALBUMIN 3.6 3.3*   < > 3.3* 3.1* 2.9*   < > = values in this interval not displayed.    Cardiac Enzymes Recent Labs  Lab 06/20/17 1445 06/21/17 0608  TROPONINI <0.03 0.03*    Glucose Recent Labs  Lab 06/21/17 1604 06/21/17 1921 06/21/17 2358 06/22/17 0410 06/22/17 0737 06/22/17 0753  GLUCAP 150* 143* 174* 195* 161* 156*    Imaging STUDIES:  2D echo pending  CULTURES: Blood cultures x2 Urine culture MRSA screen  ANTIBIOTICS: Zosyn for aspiration pneumonia  SIGNIFICANT EVENTS: June 20, 2017: Admitted June 21, 2017: Transferred to the ICU  LINES/TUBES: ET tube 11/10 Peripheral IVs Right IJ 11/10 RT fem vasc cath 11/11  DISCUSSION: This is a 76 year old Caucasian male with a significant medical history presenting in cardiorenal syndrome with acute hypercarbic and hypoxemic respiratory failure , acute on chronic renal failure and acute CHF edema  ASSESSMENT / PLAN:  PULMONARY A: Acute hypoxic/hypercarbic respiratory failure Acute pulmonary edema P:   Continue full vent support with current settings. Chest x-ray and ABG as needed. Nebulized bronchodilators. On CRRT  CARDIOVASCULAR A:  Acute CHF exacerbation-last 2-D echo was in December 2012 and showed an EF of 50-55% Worsening lower extremity edema. History of coronary artery disease status post MI. History of hypertension History of hyperlipidemia P:  Hemodynamics per ICU protocol. Vasopressors as needed keep MAP>65  RENAL A:   Acute on chronic renal failure. Elevated BUN Hyperkalemia Hypomagnesemia. Hypophosphatemia P:   Nephrology consulted; require dialysis/CRRT Monitor and correct electrolyte imbalances  GASTROINTESTINAL Start TF's  HEMATOLOGIC A:   Acute anemia-hemoglobin down to 6.1 from 10.4 ; etiology unclear P:  Transfuse as needed for hemoglobin less than 7. DVT prophylaxis with subcutaneous  heparin  ENDOCRINE A:   Uncontrolled type 2 diabetes  P:   Blood glucose monitoring with aggressive sliding scale coverage   NEUROLOGIC A:   Acute encephalopathy secondary to hypercarbia and renal failure P:   RASS goal: -1 to -2 Propofol for vent sedation and discomfort    Critical Care Time devoted to patient care services described in this note is 45 minutes.   Overall, patient is critically ill, prognosis is guarded.  Patient with Multiorgan failure and at high risk for cardiac arrest and death.    Lucie LeatherKurian David Amani Marseille, M.D.  [facility]  Medical Director [facility] Medical Director [facility]

## 2017-06-22 NOTE — Progress Notes (Signed)
Per Dr. Belia HemanKasa discontinue amlodipine and metoprolol. Christopher KusterBrandi R [name]

## 2017-06-22 NOTE — Progress Notes (Signed)
Pharmacy Antibiotic Note  Christopher [name] is a 76 y.o. male admitted on 06/20/2017 with aspiration pneumonia.  Pharmacy has been consulted for Unasyn dosing.  Plan: Will initiate Unasyn 3g Q8H. Patient is currently receiving CRRT.   Height: 5\' 8"  (172.7 cm) Weight: 224 lb 13.9 oz (102 kg) IBW/kg (Calculated) : 68.4  Temp (24hrs), Avg:98.1 F (36.7 C), Min:97.7 F (36.5 C), Max:98.6 F (37 C)  Recent Labs  Lab 06/20/17 1445 06/21/17 0605 06/21/17 0607 06/21/17 0700  06/21/17 2016 06/22/17 0012 06/22/17 0427 06/22/17 0820 06/22/17 1240  WBC 7.1 9.6  --  7.2  --   --   --   --   --   --   CREATININE 5.66* 5.82*  --   --    < > 4.15* 3.91* 3.70* 3.52* 3.00*  LATICACIDVEN  --   --  1.0  --   --   --   --   --   --   --    < > = values in this interval not displayed.    Estimated Creatinine Clearance: 25 mL/min (A) (by C-G formula based on SCr of 3 mg/dL (H)).    Allergies  Allergen Reactions  . Neurontin [Gabapentin] Other (See Comments)    Swelling when taking everyday    Antimicrobials this admission: Zosyn 11/11 >> 11/12 Unasyn 11/12 >>   Dose adjustments this admission:   Microbiology results: BCx: 11/11 >> NG UCx: 11/11 >> pending Sputum: 11/11 >> pending  MRSA PCR: Negative  Thank you for allowing pharmacy to be a part of this patient's care.  Yolanda BonineHannah Vadhir Mcnay, PharmD Pharmacy Resident 06/22/2017 2:45 PM

## 2017-06-22 NOTE — Progress Notes (Signed)
WUA attempted this morning. Propofol decreased to 30 mcg. Patient became tachycardic in the 120's and RR increased to high 30's. Patient appears uncomfortable and WOB increased. Dr. Belia HemanKasa notified, sedation resumed at 100%. Trudee KusterBrandi R [place]field

## 2017-06-22 NOTE — Progress Notes (Signed)
Decreased to 30% FIO2.   

## 2017-06-22 NOTE — Progress Notes (Signed)
New cartridge loaded into CRRT after venous  Line clot found. Venous port would flush but not give blood return. Alteplase installed. Will reassess in 2 hours.

## 2017-06-22 NOTE — Progress Notes (Signed)
[employer] [facility] [place] [facility]   PATIENT NAME: Christopher [name]    MR#:  [id number]  DATE OF BIRTH:  11/29/1940  SUBJECTIVE:  CHIEF COMPLAINT:   Chief Complaint  Patient presents with  . Shortness of Breath    Came with respi distress, had pulm edema, intubated in night and started on HD as have worsened renal func.   REVIEW OF SYSTEMS:   Intubated.  ROS  DRUG ALLERGIES:   Allergies  Allergen Reactions  . Neurontin [Gabapentin] Other (See Comments)    Swelling when taking everyday    VITALS:  Blood pressure 122/72, pulse (!) 109, temperature 98.6 F (37 C), temperature source Oral, resp. rate 20, height 5\' 8"  (1.727 m), weight 102 kg (224 lb 13.9 oz), SpO2 93 %.  PHYSICAL EXAMINATION:  GENERAL:  76 y.o.-year-old patient lying in the bed with no acute distress.  EYES: Pupils equal, round, reactive to light and accommodation. No scleral icterus. Extraocular muscles intact.  HEENT: Head atraumatic, normocephalic. Oropharynx and nasopharynx clear. ETT in place. NECK:  Supple, no jugular venous distention. No thyroid enlargement, no tenderness.  LUNGS: Normal breath sounds bilaterally, no wheezing, rales,rhonchi or crepitation. No use of accessory muscles of respiration. ETT on vent support. CARDIOVASCULAR: S1, S2 normal. No murmurs, rubs, or gallops.  ABDOMEN: Soft, nontender, nondistended. Bowel sounds present. No organomegaly or mass.  EXTREMITIES: b/l pedal edema,no cyanosis, or clubbing. Right groin HD cath in place with CRRT. NEUROLOGIC: Intubated on vent support.  PSYCHIATRIC: The patient is on vent.  SKIN: No obvious rash, lesion, or ulcer.   Physical Exam LABORATORY PANEL:   CBC Recent Labs  Lab 06/21/17 0700  WBC 7.2  HGB 9.5*  HCT 29.8*  PLT 150   ------------------------------------------------------------------------------------------------------------------  Chemistries  Recent Labs  Lab 06/21/17 0605  06/22/17 0820  NA 139   <  > 139  K 5.9*   < > 4.1  CL 112*   < > 107  CO2 18*   < > 23  GLUCOSE 276*   < > 160*  BUN 150*   < > 93*  CREATININE 5.82*   < > 3.52*  CALCIUM 7.2*   < > 7.7*  MG 2.5*   < > 2.0  AST 14*  --   --   ALT 17  --   --   ALKPHOS 75  --   --   BILITOT 0.5  --   --    < > = values in this interval not displayed.   ------------------------------------------------------------------------------------------------------------------  Cardiac Enzymes Recent Labs  Lab 06/20/17 1445 06/21/17 0608  TROPONINI <0.03 0.03*   ------------------------------------------------------------------------------------------------------------------  RADIOLOGY:  Dg Abd 1 View  Result Date: 06/21/2017 CLINICAL DATA:  Nasogastric tube placement. EXAM: ABDOMEN - 1 VIEW COMPARISON:  Lumbar spine radiographs performed 04/17/2017 FINDINGS: The patient's enteric tube is noted ending overlying the body of the stomach. The visualized bowel gas pattern is grossly unremarkable. No free intra-abdominal air is seen, though evaluation for free air is limited on a single supine view. Mild degenerative change is noted along the lumbar spine. The patient is status post median sternotomy. IMPRESSION: Enteric tube noted ending overlying the body of the stomach. Electronically Signed   By: Roanna Raider M.D.   On: 06/21/2017 06:27   US Renal  Result Date: 06/21/2017 CLINICAL DATA:  Acute renal failure EXAM: RENAL ULTRASOUND COMPARISON:  None. FINDINGS: Right Kidney: Length: 12.2 cm. Echogenicity and renal cortical thickness are within normal limits.  No perinephric fluid or hydronephrosis visualized. There is a cyst in the mid right kidney measuring 2.1 x 2.3 x 2.2 cm. There is a second nearby cyst measuring 1.8 x 1.5 x 1.5 cm. No sonographically demonstrable calculus or ureterectasis. Left Kidney: Length: 12.3 cm. Echogenicity and renal cortical thickness are within normal limits. No perinephric fluid or hydronephrosis  visualized. There is a cyst in the lower pole left kidney measuring 2.2 x 2.5 x 2.4 cm. There is an adjacent lower pole left renal cyst measuring 2.4 x 1.8 x 1.8 cm. There is a smaller cyst more superiorly on the left measuring 1.4 x 1.4 x 1.4 cm. No sonographically demonstrable calculus or ureterectasis. Bladder: Foley catheter in place. Urinary bladder empty and cannot be assessed. IMPRESSION: There are small renal cysts bilaterally. No noncystic renal masses evident. No obstructing focus in either kidney. The renal cortical thickness and echogenicity are normal bilaterally. Urinary bladder decompressed and cannot be assessed. Electronically Signed   By: Bretta BangWilliam  Woodruff III M.D.   On: 06/21/2017 11:28   Portable Chest Xray  Result Date: 06/22/2017 CLINICAL DATA:  Acute respiratory failure. EXAM: PORTABLE CHEST 1 VIEW COMPARISON:  One-view chest x-ray 06/21/2017. FINDINGS: The endotracheal tube is stable. A right IJ line is stable. NG tube courses off the inferior border of the film. Lung volumes remain low. Small bilateral pleural effusions and associated airspace disease are stable. Aeration is slightly improved. Aortic atherosclerosis is again noted. IMPRESSION: 1. Slight improved aeration and decreasing pulmonary vascular congestion. 2. Persistent low lung volumes with bilateral pleural effusions and basilar airspace disease, likely atelectasis. 3. The support apparatus is stable. Electronically Signed   By: Marin Robertshristopher  Mattern M.D.   On: 06/22/2017 07:13   Dg Chest Port 1 View  Result Date: 06/21/2017 CLINICAL DATA:  Nasogastric tube placement. EXAM: PORTABLE CHEST 1 VIEW COMPARISON:  Chest radiograph performed 06/20/2017 FINDINGS: The patient's enteric tube is noted extending below the diaphragm. The patient's endotracheal tube is seen ending 5 cm above the carina. The lungs are hypoexpanded. Small bilateral pleural effusions are seen. Bibasilar airspace opacities may reflect mild interstitial  edema or atelectasis. No pneumothorax is seen. The cardiomediastinal silhouette is normal in size. The patient is status post median sternotomy, with evidence of prior CABG. No acute osseous abnormalities are identified. A right IJ line is noted ending about the mid SVC. Cervical spinal fusion hardware is noted. IMPRESSION: 1. Enteric tube noted extending below the diaphragm. 2. Endotracheal tube noted ending 5 cm above the carina. 3. Lungs hypoexpanded. Small bilateral pleural effusions noted. Bibasilar airspace opacities may reflect mild interstitial edema or atelectasis. Electronically Signed   By: Roanna RaiderJeffery  Chang M.D.   On: 06/21/2017 06:26   Dg Chest Portable 1 View  Result Date: 06/20/2017 CLINICAL DATA:  Shortness of Breath EXAM: PORTABLE CHEST 1 VIEW COMPARISON:  August 01, 2011 FINDINGS: There is atelectatic change in the bases. The lungs elsewhere are clear. Heart is upper normal in size with pulmonary vascularity within normal limits. Patient is status post coronary artery bypass grafting. There is aortic atherosclerosis. There is postoperative change in the lower cervical spine. There is also postoperative change in the right shoulder. There is degenerative change in each shoulder. IMPRESSION: Bibasilar atelectasis. No edema or consolidation. Heart size upper normal with pulmonary vascularity normal. Status post coronary artery bypass grafting. There is aortic atherosclerosis. Aortic Atherosclerosis (ICD10-I70.0). Electronically Signed   By: Bretta BangWilliam  Woodruff III M.D.   On: 06/20/2017 15:10    ASSESSMENT  AND PLAN:   Active Problems:   Acute renal failure superimposed on stage 4 chronic kidney disease (HCC)   Acute respiratory failure (HCC)   Acute renal failure (HCC)  1.  Ac respi failure due to Ac diastolic CHF   Fluid overload due to Renal failure: Acute on chronic; end-stage renal disease at this time.     Patient is fluid overloaded resulting in hypoxia.   Appreciated nephro help  to initiate dialysis.     Intubated on vent support. 2.  Diabetes mellitus type 2: Continue NPH per home regimen.  Gabapentin for neuropathic pain 3.  Coronary artery disease: Stable: Continue aspirin 4.  Hypertension: Controlled; Hold amlodipine and metoprolol. 5.  Gout: Controlled; hold allopurinol 6.  DVT prophylaxis: Heparin 7.  GI prophylaxis: None   All the records are reviewed and case discussed with Care Management/Social Workerr. Management plans discussed with the patient, family and they are in agreement.  CODE STATUS: Full.  TOTAL TIME TAKING CARE OF THIS PATIENT: 35 minutes.   Discussed with wife and grand daughter in room.  POSSIBLE D/C IN 2-3 DAYS, DEPENDING ON CLINICAL CONDITION.   Altamese DillingVACHHANI, Attallah Ontko M.D on 06/22/2017   Between 7am to 6pm - Pager - [phone]  After 6pm go to [email or web address] - password [email or web address]  [facility]  Office  [phone]  CC: Primary care physician; System, Provider Not In  Note: This dictation was prepared with Dragon dictation along with smaller phrase technology. Any transcriptional errors that result from this process are unintentional.

## 2017-06-22 NOTE — Progress Notes (Signed)
[place], [place] 06/22/17  Subjective:   Patient remains critically ill. Remains on ventilator support Family members at bedside Continued on CRRT.  Tolerating well. UF goal 0 Urine output 1000 cc  Objective:  Vital signs in last 24 hours:  Temp:  [97.7 F (36.5 C)-98.6 F (37 C)] 98.6 F (37 C) (11/12 1200) Pulse Rate:  [85-118] 109 (11/12 1200) Resp:  [18-27] 20 (11/12 1200) BP: (95-145)/(60-99) 122/72 (11/12 1200) SpO2:  [92 %-96 %] 93 % (11/12 1200) FiO2 (%):  [30 %-40 %] 30 % (11/12 0750) Weight:  [102 kg (224 lb 13.9 oz)] 102 kg (224 lb 13.9 oz) (11/12 0427)  Weight change: 2.209 kg (4 lb 13.9 oz) Filed Weights   06/20/17 2041 06/21/17 0213 06/22/17 0427  Weight: 100.9 kg (222 lb 8 oz) 104.4 kg (230 lb 3.2 oz) 102 kg (224 lb 13.9 oz)    Intake/Output:    Intake/Output Summary (Last 24 hours) at 06/22/2017 1300 Last data filed at 06/22/2017 1200 Gross per 24 hour  Intake 1131.66 ml  Output 1435 ml  Net -303.34 ml     Physical Exam: General:  Critically ill-appearing  HEENT  ET tube in place  Neck  no masses  Pulm/lungs  ventilator assisted  CVS/Heart  no rub or gallop  Abdomen:   Soft  Extremities: ++ Significant dependent edema  Neurologic:  Sedated     Access:  Temporary dialysis catheter       Basic Metabolic Panel:  Recent Labs  Lab 06/21/17 1632 06/21/17 2016 06/22/17 0012 06/22/17 0427 06/22/17 0820  NA 141 140 140 140 139  K 4.0 4.0 3.9 3.6 4.1  CL 108 107 107 107 107  CO2 22 20* '23 22 23  '$ GLUCOSE 167* 150* 179* 176* 160*  BUN 121* 113* 111* 99* 93*  CREATININE 4.46* 4.15* 3.91* 3.70* 3.52*  CALCIUM 7.6* 7.5* 7.8* 7.1* 7.7*  MG 2.3 2.3 2.2 2.1 2.0  PHOS 6.4* 5.9* 5.2* 5.4* 5.0*     CBC: Recent Labs  Lab 06/20/17 1445 06/21/17 0605 06/21/17 0700  WBC 7.1 9.6 7.2  NEUTROABS 5.2  --   --   HGB 10.4* 6.1* 9.5*  HCT 32.9* 19.4* 29.8*  MCV 99.2 100.2* 98.6  PLT 164 184 150     No results  found for: HEPBSAG, HEPBSAB, HEPBIGM    Microbiology:  Recent Results (from the past 240 hour(s))  MRSA PCR Screening     Status: None   Collection Time: 06/21/17  5:00 AM  Result Value Ref Range Status   MRSA by PCR NEGATIVE NEGATIVE Final    Comment:        The GeneXpert MRSA Assay (FDA approved for NASAL specimens only), is one component of a comprehensive MRSA colonization surveillance program. It is not intended to diagnose MRSA infection nor to guide or monitor treatment for MRSA infections.   Culture, blood (Routine X 2) w Reflex to ID Panel     Status: None (Preliminary result)   Collection Time: 06/21/17  8:00 AM  Result Value Ref Range Status   Specimen Description BLOOD RIGHT ANTECUBITAL  Final   Special Requests   Final    BOTTLES DRAWN AEROBIC AND ANAEROBIC Blood Culture results may not be optimal due to an excessive volume of blood received in culture bottles   Culture NO GROWTH < 24 HOURS  Final   Report Status PENDING  Incomplete  Culture, blood (Routine X 2) w Reflex to ID Panel  Status: None (Preliminary result)   Collection Time: 06/21/17  8:04 AM  Result Value Ref Range Status   Specimen Description BLOOD BLOOD RIGHT FOREARM  Final   Special Requests   Final    BOTTLES DRAWN AEROBIC AND ANAEROBIC Blood Culture results may not be optimal due to an excessive volume of blood received in culture bottles   Culture NO GROWTH < 24 HOURS  Final   Report Status PENDING  Incomplete    Coagulation Studies: Recent Labs    06/21/17 0605  LABPROT 15.1  INR 1.20    Urinalysis: No results for input(s): COLORURINE, LABSPEC, PHURINE, GLUCOSEU, HGBUR, BILIRUBINUR, KETONESUR, PROTEINUR, UROBILINOGEN, NITRITE, LEUKOCYTESUR in the last 72 hours.  Invalid input(s): APPERANCEUR    Imaging: Dg Abd 1 View  Result Date: 06/21/2017 CLINICAL DATA:  Nasogastric tube placement. EXAM: ABDOMEN - 1 VIEW COMPARISON:  Lumbar spine radiographs performed 04/17/2017  FINDINGS: The patient's enteric tube is noted ending overlying the body of the stomach. The visualized bowel gas pattern is grossly unremarkable. No free intra-abdominal air is seen, though evaluation for free air is limited on a single supine view. Mild degenerative change is noted along the lumbar spine. The patient is status post median sternotomy. IMPRESSION: Enteric tube noted ending overlying the body of the stomach. Electronically Signed   By: Garald Balding M.D.   On: 06/21/2017 06:27   US Renal  Result Date: 06/21/2017 CLINICAL DATA:  Acute renal failure EXAM: RENAL ULTRASOUND COMPARISON:  None. FINDINGS: Right Kidney: Length: 12.2 cm. Echogenicity and renal cortical thickness are within normal limits. No perinephric fluid or hydronephrosis visualized. There is a cyst in the mid right kidney measuring 2.1 x 2.3 x 2.2 cm. There is a second nearby cyst measuring 1.8 x 1.5 x 1.5 cm. No sonographically demonstrable calculus or ureterectasis. Left Kidney: Length: 12.3 cm. Echogenicity and renal cortical thickness are within normal limits. No perinephric fluid or hydronephrosis visualized. There is a cyst in the lower pole left kidney measuring 2.2 x 2.5 x 2.4 cm. There is an adjacent lower pole left renal cyst measuring 2.4 x 1.8 x 1.8 cm. There is a smaller cyst more superiorly on the left measuring 1.4 x 1.4 x 1.4 cm. No sonographically demonstrable calculus or ureterectasis. Bladder: Foley catheter in place. Urinary bladder empty and cannot be assessed. IMPRESSION: There are small renal cysts bilaterally. No noncystic renal masses evident. No obstructing focus in either kidney. The renal cortical thickness and echogenicity are normal bilaterally. Urinary bladder decompressed and cannot be assessed. Electronically Signed   By: Lowella Grip III M.D.   On: 06/21/2017 11:28   Portable Chest Xray  Result Date: 06/22/2017 CLINICAL DATA:  Acute respiratory failure. EXAM: PORTABLE CHEST 1 VIEW  COMPARISON:  One-view chest x-ray 06/21/2017. FINDINGS: The endotracheal tube is stable. A right IJ line is stable. NG tube courses off the inferior border of the film. Lung volumes remain low. Small bilateral pleural effusions and associated airspace disease are stable. Aeration is slightly improved. Aortic atherosclerosis is again noted. IMPRESSION: 1. Slight improved aeration and decreasing pulmonary vascular congestion. 2. Persistent low lung volumes with bilateral pleural effusions and basilar airspace disease, likely atelectasis. 3. The support apparatus is stable. Electronically Signed   By: San Morelle M.D.   On: 06/22/2017 07:13   Dg Chest Port 1 View  Result Date: 06/21/2017 CLINICAL DATA:  Nasogastric tube placement. EXAM: PORTABLE CHEST 1 VIEW COMPARISON:  Chest radiograph performed 06/20/2017 FINDINGS: The patient's enteric tube is  noted extending below the diaphragm. The patient's endotracheal tube is seen ending 5 cm above the carina. The lungs are hypoexpanded. Small bilateral pleural effusions are seen. Bibasilar airspace opacities may reflect mild interstitial edema or atelectasis. No pneumothorax is seen. The cardiomediastinal silhouette is normal in size. The patient is status post median sternotomy, with evidence of prior CABG. No acute osseous abnormalities are identified. A right IJ line is noted ending about the mid SVC. Cervical spinal fusion hardware is noted. IMPRESSION: 1. Enteric tube noted extending below the diaphragm. 2. Endotracheal tube noted ending 5 cm above the carina. 3. Lungs hypoexpanded. Small bilateral pleural effusions noted. Bibasilar airspace opacities may reflect mild interstitial edema or atelectasis. Electronically Signed   By: Garald Balding M.D.   On: 06/21/2017 06:26   Dg Chest Portable 1 View  Result Date: 06/20/2017 CLINICAL DATA:  Shortness of Breath EXAM: PORTABLE CHEST 1 VIEW COMPARISON:  August 01, 2011 FINDINGS: There is atelectatic  change in the bases. The lungs elsewhere are clear. Heart is upper normal in size with pulmonary vascularity within normal limits. Patient is status post coronary artery bypass grafting. There is aortic atherosclerosis. There is postoperative change in the lower cervical spine. There is also postoperative change in the right shoulder. There is degenerative change in each shoulder. IMPRESSION: Bibasilar atelectasis. No edema or consolidation. Heart size upper normal with pulmonary vascularity normal. Status post coronary artery bypass grafting. There is aortic atherosclerosis. Aortic Atherosclerosis (ICD10-I70.0). Electronically Signed   By: Lowella Grip III M.D.   On: 06/20/2017 15:10     Medications:   . ampicillin-sulbactam (UNASYN) IV    . norepinephrine (LEVOPHED) Adult infusion Stopped (06/22/17 0402)  . propofol (DIPRIVAN) infusion 50 mcg/kg/min (06/22/17 0851)  . pureflow 2,000 mL/hr at 06/22/17 0538   . amitriptyline  10 mg Oral QHS  . aspirin  81 mg Oral Daily  . chlorhexidine gluconate (MEDLINE KIT)  15 mL Mouth Rinse BID  . docusate  100 mg Oral BID  . feeding supplement (PRO-STAT SUGAR FREE 64)  60 mL Per Tube QID  . feeding supplement (VITAL HIGH PROTEIN)  1,000 mL Per Tube Q24H  . furosemide  40 mg Intravenous Q12H  . heparin  5,000 Units Subcutaneous Q8H  . insulin aspart  0-15 Units Subcutaneous Q4H  . insulin detemir  10 Units Subcutaneous BID AC & HS  . ipratropium-albuterol  3 mL Nebulization Q6H  . mouth rinse  15 mL Mouth Rinse 10 times per day  . multivitamin  15 mL Per Tube Daily  . oxyCODONE  5 mg Oral BID  . pantoprazole (PROTONIX) IV  40 mg Intravenous Daily   acetaminophen **OR** acetaminophen, bisacodyl, fentaNYL (SUBLIMAZE) injection, gabapentin, heparin, midazolam, midazolam, ondansetron **OR** ondansetron (ZOFRAN) IV, sennosides  Assessment/ Plan:  76 y.o. caucasian male with chronic kidney disease stage IV baseline creatinine 2.5-3, coronary artery  disease, hypertension, hyperlipidemia, left partial nephrectomy, gout, diabetes mellitus type II insulin dependent, diabetic peripheral neuropathy and osteoarthritis, who was admitted to [facility] on 06/20/2017 for SOB (shortness of breath) [R06.02] Hypoxia [R09.02] Acute renal failure, unspecified acute renal failure type ([place]) [N17  1.  Acute renal failure 2.  Hyperkalemia 3.  Chronic kidney stage IV 4.  Diabetes type 2 with chronic kidney disease 5.  Acute respiratory failure, requiring ventilator support 6.  Generalized edema  Continue CRRT, Currently on 2K bath.  UF goal 0. Will consider increasing UF tomorrow For now monitor urine output.  Currently getting furosemide 40 mg  IV every 12 hours Monitor electrolytes closely   LOS: 2 Nadea Kirkland 11/12/20181:00 PM  [place], [place]

## 2017-06-23 DIAGNOSIS — J9622 Acute and chronic respiratory failure with hypercapnia: Secondary | ICD-10-CM

## 2017-06-23 DIAGNOSIS — E8770 Fluid overload, unspecified: Secondary | ICD-10-CM

## 2017-06-23 DIAGNOSIS — J9621 Acute and chronic respiratory failure with hypoxia: Secondary | ICD-10-CM

## 2017-06-23 LAB — RENAL FUNCTION PANEL
ALBUMIN: 2.7 g/dL — AB (ref 3.5–5.0)
ALBUMIN: 2.7 g/dL — AB (ref 3.5–5.0)
ALBUMIN: 2.9 g/dL — AB (ref 3.5–5.0)
ANION GAP: 10 (ref 5–15)
ANION GAP: 10 (ref 5–15)
ANION GAP: 10 (ref 5–15)
ANION GAP: 11 (ref 5–15)
ANION GAP: 11 (ref 5–15)
Albumin: 2.5 g/dL — ABNORMAL LOW (ref 3.5–5.0)
Albumin: 2.6 g/dL — ABNORMAL LOW (ref 3.5–5.0)
Albumin: 2.8 g/dL — ABNORMAL LOW (ref 3.5–5.0)
Anion gap: 11 (ref 5–15)
BUN: 60 mg/dL — ABNORMAL HIGH (ref 6–20)
BUN: 65 mg/dL — AB (ref 6–20)
BUN: 67 mg/dL — ABNORMAL HIGH (ref 6–20)
BUN: 70 mg/dL — ABNORMAL HIGH (ref 6–20)
BUN: 79 mg/dL — ABNORMAL HIGH (ref 6–20)
BUN: 89 mg/dL — ABNORMAL HIGH (ref 6–20)
CALCIUM: 7.3 mg/dL — AB (ref 8.9–10.3)
CALCIUM: 7.8 mg/dL — AB (ref 8.9–10.3)
CALCIUM: 8 mg/dL — AB (ref 8.9–10.3)
CHLORIDE: 105 mmol/L (ref 101–111)
CO2: 22 mmol/L (ref 22–32)
CO2: 22 mmol/L (ref 22–32)
CO2: 23 mmol/L (ref 22–32)
CO2: 23 mmol/L (ref 22–32)
CO2: 24 mmol/L (ref 22–32)
CO2: 24 mmol/L (ref 22–32)
CREATININE: 2.34 mg/dL — AB (ref 0.61–1.24)
CREATININE: 2.43 mg/dL — AB (ref 0.61–1.24)
CREATININE: 2.52 mg/dL — AB (ref 0.61–1.24)
CREATININE: 2.96 mg/dL — AB (ref 0.61–1.24)
Calcium: 7.6 mg/dL — ABNORMAL LOW (ref 8.9–10.3)
Calcium: 8 mg/dL — ABNORMAL LOW (ref 8.9–10.3)
Calcium: 8 mg/dL — ABNORMAL LOW (ref 8.9–10.3)
Chloride: 105 mmol/L (ref 101–111)
Chloride: 105 mmol/L (ref 101–111)
Chloride: 105 mmol/L (ref 101–111)
Chloride: 106 mmol/L (ref 101–111)
Chloride: 106 mmol/L (ref 101–111)
Creatinine, Ser: 2.71 mg/dL — ABNORMAL HIGH (ref 0.61–1.24)
Creatinine, Ser: 2.19 mg/dL — ABNORMAL HIGH (ref 0.61–1.24)
GFR calc Af Amer: 27 mL/min — ABNORMAL LOW (ref >60)
GFR calc non Af Amer: 22 mL/min — ABNORMAL LOW (ref >60)
GFR calc non Af Amer: 24 mL/min — ABNORMAL LOW (ref >60)
GFR calc non Af Amer: 25 mL/min — ABNORMAL LOW (ref >60)
GFR, EST AFRICAN AMERICAN: 22 mL/min — AB (ref >60)
GFR, EST AFRICAN AMERICAN: 25 mL/min — AB (ref >60)
GFR, EST AFRICAN AMERICAN: 29 mL/min — AB (ref >60)
GFR, EST AFRICAN AMERICAN: 30 mL/min — AB (ref >60)
GFR, EST AFRICAN AMERICAN: 32 mL/min — AB (ref >60)
GFR, EST NON AFRICAN AMERICAN: 19 mL/min — AB (ref >60)
GFR, EST NON AFRICAN AMERICAN: 26 mL/min — AB (ref >60)
GFR, EST NON AFRICAN AMERICAN: 28 mL/min — AB (ref >60)
GLUCOSE: 182 mg/dL — AB (ref 65–99)
GLUCOSE: 139 mg/dL — AB (ref 65–99)
GLUCOSE: 111 mg/dL — AB (ref 65–99)
Glucose, Bld: 169 mg/dL — ABNORMAL HIGH (ref 65–99)
Glucose, Bld: 142 mg/dL — ABNORMAL HIGH (ref 65–99)
Glucose, Bld: 173 mg/dL — ABNORMAL HIGH (ref 65–99)
PHOSPHORUS: 4 mg/dL (ref 2.5–4.6)
PHOSPHORUS: 4.2 mg/dL (ref 2.5–4.6)
PHOSPHORUS: 4.4 mg/dL (ref 2.5–4.6)
POTASSIUM: 4 mmol/L (ref 3.5–5.1)
POTASSIUM: 4.1 mmol/L (ref 3.5–5.1)
POTASSIUM: 4.3 mmol/L (ref 3.5–5.1)
POTASSIUM: 4.4 mmol/L (ref 3.5–5.1)
Phosphorus: 3.5 mg/dL (ref 2.5–4.6)
Phosphorus: 3.9 mg/dL (ref 2.5–4.6)
Phosphorus: 4.6 mg/dL (ref 2.5–4.6)
Potassium: 4.2 mmol/L (ref 3.5–5.1)
Potassium: 4.3 mmol/L (ref 3.5–5.1)
SODIUM: 140 mmol/L (ref 135–145)
SODIUM: 139 mmol/L (ref 135–145)
SODIUM: 139 mmol/L (ref 135–145)
Sodium: 138 mmol/L (ref 135–145)
Sodium: 138 mmol/L (ref 135–145)
Sodium: 139 mmol/L (ref 135–145)

## 2017-06-23 LAB — MAGNESIUM
MAGNESIUM: 1.9 mg/dL (ref 1.7–2.4)
MAGNESIUM: 1.9 mg/dL (ref 1.7–2.4)
MAGNESIUM: 2 mg/dL (ref 1.7–2.4)
Magnesium: 1.9 mg/dL (ref 1.7–2.4)
Magnesium: 1.7 mg/dL (ref 1.7–2.4)
Magnesium: 2 mg/dL (ref 1.7–2.4)

## 2017-06-23 LAB — CBC WITH DIFFERENTIAL/PLATELET
Basophils Absolute: 0.1 10*3/uL (ref 0–0.1)
Basophils Relative: 1 %
EOS PCT: 1 %
Eosinophils Absolute: 0.1 10*3/uL (ref 0–0.7)
HCT: 26.6 % — ABNORMAL LOW (ref 40.0–52.0)
HEMOGLOBIN: 8.5 g/dL — AB (ref 13.0–18.0)
LYMPHS ABS: 1.1 10*3/uL (ref 1.0–3.6)
LYMPHS PCT: 14 %
MCH: 30.9 pg (ref 26.0–34.0)
MCHC: 31.9 g/dL — ABNORMAL LOW (ref 32.0–36.0)
MCV: 97 fL (ref 80.0–100.0)
MONOS PCT: 17 %
Monocytes Absolute: 1.3 10*3/uL — ABNORMAL HIGH (ref 0.2–1.0)
NEUTROS PCT: 67 %
Neutro Abs: 5.2 10*3/uL (ref 1.4–6.5)
Platelets: 93 10*3/uL — ABNORMAL LOW (ref 150–440)
RBC: 2.74 MIL/uL — AB (ref 4.40–5.90)
RDW: 14.6 % — ABNORMAL HIGH (ref 11.5–14.5)
WBC: 7.8 10*3/uL (ref 3.8–10.6)

## 2017-06-23 LAB — GLUCOSE, CAPILLARY
Glucose-Capillary: 116 mg/dL — ABNORMAL HIGH (ref 65–99)
Glucose-Capillary: 116 mg/dL — ABNORMAL HIGH (ref 65–99)
Glucose-Capillary: 137 mg/dL — ABNORMAL HIGH (ref 65–99)
Glucose-Capillary: 155 mg/dL — ABNORMAL HIGH (ref 65–99)
Glucose-Capillary: 166 mg/dL — ABNORMAL HIGH (ref 65–99)
Glucose-Capillary: 168 mg/dL — ABNORMAL HIGH (ref 65–99)
Glucose-Capillary: 198 mg/dL — ABNORMAL HIGH (ref 65–99)

## 2017-06-23 LAB — APTT: aPTT: 45 seconds — ABNORMAL HIGH (ref 24–36)

## 2017-06-23 MED ORDER — ALTEPLASE 2 MG IJ SOLR
2.0000 mg | Freq: Once | INTRAMUSCULAR | Status: AC
  Administered 2017-06-23: 2 mg
  Filled 2017-06-23: qty 2

## 2017-06-23 MED ORDER — FENTANYL CITRATE (PF) 100 MCG/2ML IJ SOLN
50.0000 ug | INTRAMUSCULAR | Status: DC | PRN
  Administered 2017-06-23 (×2): 100 ug via INTRAVENOUS
  Administered 2017-06-23: 50 ug via INTRAVENOUS
  Administered 2017-06-23: 100 ug via INTRAVENOUS
  Administered 2017-06-23: 50 ug via INTRAVENOUS
  Administered 2017-06-24: 100 ug via INTRAVENOUS
  Filled 2017-06-23 (×6): qty 2

## 2017-06-23 MED ORDER — MIDAZOLAM HCL 2 MG/2ML IJ SOLN
1.0000 mg | INTRAMUSCULAR | Status: DC | PRN
  Administered 2017-06-23 – 2017-06-24 (×4): 2 mg via INTRAVENOUS
  Filled 2017-06-23 (×5): qty 2

## 2017-06-23 MED ORDER — DOCUSATE SODIUM 50 MG/5ML PO LIQD
100.0000 mg | Freq: Two times a day (BID) | ORAL | Status: DC
  Administered 2017-06-23 (×2): 100 mg
  Filled 2017-06-23 (×2): qty 10

## 2017-06-23 MED ORDER — SENNOSIDES 8.8 MG/5ML PO SYRP
15.0000 mL | ORAL_SOLUTION | ORAL | Status: DC | PRN
  Administered 2017-06-23: 15 mL
  Filled 2017-06-23 (×2): qty 15

## 2017-06-23 MED ORDER — VITAL HIGH PROTEIN PO LIQD
1000.0000 mL | ORAL | Status: DC
  Administered 2017-06-23: 1000 mL

## 2017-06-23 MED ORDER — ACETAMINOPHEN 325 MG PO TABS: 650.0000 mg | ORAL_TABLET | Freq: Four times a day (QID) | ORAL | Status: DC | PRN

## 2017-06-23 MED ORDER — ACETAMINOPHEN 650 MG RE SUPP: 650.0000 mg | Freq: Four times a day (QID) | RECTAL | Status: DC | PRN

## 2017-06-23 MED ORDER — ASPIRIN 81 MG PO CHEW
81.0000 mg | CHEWABLE_TABLET | Freq: Every day | ORAL | Status: DC
  Administered 2017-06-23 – 2017-07-07 (×15): 81 mg
  Filled 2017-06-23 (×16): qty 1

## 2017-06-23 MED ORDER — PUREFLOW DIALYSIS SOLUTION: INTRAVENOUS | Status: DC

## 2017-06-23 MED ORDER — FAMOTIDINE IN NACL 20-0.9 MG/50ML-% IV SOLN
20.0000 mg | INTRAVENOUS | Status: DC
  Administered 2017-06-23: 20 mg via INTRAVENOUS
  Filled 2017-06-23 (×2): qty 50

## 2017-06-23 MED ORDER — MAGNESIUM SULFATE IN D5W 1-5 GM/100ML-% IV SOLN
1.0000 g | Freq: Once | INTRAVENOUS | Status: AC
  Administered 2017-06-23: 1 g via INTRAVENOUS
  Filled 2017-06-23: qty 100

## 2017-06-23 MED ORDER — PRO-STAT SUGAR FREE PO LIQD
60.0000 mL | Freq: Two times a day (BID) | ORAL | Status: DC
  Administered 2017-06-23: 60 mL

## 2017-06-23 NOTE — Progress Notes (Signed)
Nutrition Follow-up  DOCUMENTATION CODES:   Obesity unspecified  INTERVENTION:  Recommend new goal regimen of Vital High Protein at 40 mL/hr (960 mL goal daily volume) + Pro-Stat 60 mL BID via OGT. Provides 1360 kcal, 144 grams of protein, 806 mL H2O daily.  Continue liquid multivitamin with minerals as goal TF regimen still does not meet 100% RDIs.  NUTRITION DIAGNOSIS:   Inadequate oral intake related to inability to eat as evidenced by NPO status(patient currently intubated).  Ongoing - addressing with TF regimen.  GOAL:   Provide needs based on ASPEN/SCCM guidelines  Met with TF regimen.  MONITOR:   Vent status, Labs, Weight trends, TF tolerance, I & O's  REASON FOR ASSESSMENT:   Ventilator    ASSESSMENT:   76 year old male with PMHx of HTN, anxiety, CAD, hx MI 2003, HLD, DM type 2, chronic back pain, chronic kidney disease stage IV, hx partial nephrectomy in 2000 who presented with shortness of breath found to have acute on chronic kidney disease with volume overload resulting in hypoxia. Rapid response was called 11/11 due to low oxygen saturation and decreased responsiveness and was emergently intubated.  Patient not sedated at time of assessment. Per RN patient has been off all sedation all morning. Patient in SBT at time of assessment. Plan is to assess for possible extubation tomorrow. Patient continues on CRRT. UF was increased to 100 mL/hr this AM with plan to increase to 150 mL/hr later this afternoon. Patient has not had a BM this admission.  Access: OGT placed 11/11; terminates in body of stomach per abdominal x-ray 11/11; 65 cm at corner of mouth  MAP: 74-99 mmHg  TF: patient tolerating 10 mL/hr + Pro-Stat 60 mL QID  Patient is currently intubated on ventilator support MV: 14 L/min Temp (24hrs), Avg:99.6 F (37.6 C), Min:98.2 F (36.8 C), Max:101.5 F (38.6 C)  Propofol: N/A  Medications reviewed and include: Colace, Lasix 40 mg Q12hrs, Novolog  0-15 units Q4hrs, Levemir 10 units BID, liquid MVI daily, famotidine.  Labs reviewed: CBG 116-168, BUN 70 (trending down), Creatinine 2.52 (trending down). Phosphorus and Potassium now WNL.  I/O: 1225 mL UOP yesterday (0.5 mL/kg/hr); no UF yesterday  Weight trend: 99.4 kg on 11/14; wt trending back down but still increased from 94.5 kg on 10/4  Diet Order:  Diet NPO time specified  EDUCATION NEEDS:   No education needs have been identified at this time  Skin:  Skin Assessment: Reviewed RN Assessment(abrasions to legs and knees)  Last BM:  06/20/2017  Height:   Ht Readings from Last 1 Encounters:  06/20/17 '5\' 8"'$  (1.727 m)    Weight:   Wt Readings from Last 1 Encounters:  06/23/17 219 lb 2.2 oz (99.4 kg)    Ideal Body Weight:  70 kg  BMI:  Body mass index is 33.32 kg/m.  Estimated Nutritional Needs:   Kcal:  1040-1323 (11-14 kcal/kg)  Protein:  140-175 grams (2-2.5 grams/kg IBW for CVVHD)  Fluid:  1.7-2.1 L/day (25-30 mL/kg IBW)  Willey Blade, MS, RD, LDN Office: [phone] Pager: [phone] After Hours/Weekend Pager: [phone]

## 2017-06-23 NOTE — Progress Notes (Signed)
At beginning of shift CRRT access and cartridge clotted. Both access ports were cathflo'd, multiple cartridges were changed throughout night. CRRT has been running without complication since 0100. Although CRRT was suspended for approximately 5 hours, labs have remained at beginning of shift baseline.

## 2017-06-23 NOTE — Progress Notes (Signed)
CRRT access clotted. Notified nephology on behalf of CounsellorBrandi RN. MD ordered tPA administration to open line and to stop CRRT at this time. Patient had over 1 L of urine output this shift.  MD will reassess patient's condition in the morning. Team will continue to monitor.

## 2017-06-23 NOTE — Progress Notes (Signed)
UF started per order. Trudee KusterBrandi R [place]

## 2017-06-23 NOTE — Progress Notes (Signed)
UF increased to 150 per order from Dr. Thedore MinsSingh. Trudee KusterBrandi R [place]

## 2017-06-23 NOTE — Progress Notes (Addendum)
[place], [place] 06/23/17  Subjective:   Patient remains critically ill. Remains on ventilator support . Fio2 30% Family members at bedside Continued on CRRT.  Tolerating well. UF goal 0 Urine output 1200 cc Off of pressors CRRT dialyzer clotted last night; restarted,   Objective:  Vital signs in last 24 hours:  Temp:  [98.2 F (36.8 C)-101.5 F (38.6 C)] 98.6 F (37 C) (11/13 0725) Pulse Rate:  [94-118] 101 (11/13 0900) Resp:  [15-29] 21 (11/13 0900) BP: (103-134)/(61-81) 122/73 (11/13 0900) SpO2:  [90 %-95 %] 93 % (11/13 0900) FiO2 (%):  [30 %] 30 % (11/13 0847) Weight:  [99.4 kg (219 lb 2.2 oz)] 99.4 kg (219 lb 2.2 oz) (11/13 0500)  Weight change: -2.6 kg (-11.7 oz) Filed Weights   06/21/17 0213 06/22/17 0427 06/23/17 0500  Weight: 104.4 kg (230 lb 3.2 oz) 102 kg (224 lb 13.9 oz) 99.4 kg (219 lb 2.2 oz)    Intake/Output:    Intake/Output Summary (Last 24 hours) at 06/23/2017 1022 Last data filed at 06/23/2017 0900 Gross per 24 hour  Intake 1289.9 ml  Output 1095 ml  Net 194.9 ml     Physical Exam: General:  Critically ill-appearing  HEENT  ET tube in place  Neck  no masses  Pulm/lungs  ventilator assisted  CVS/Heart  no rub or gallop  Abdomen:   Soft  Extremities: ++ Significant dependent edema  Neurologic:  Sedated     Access:  Temporary dialysis catheter; Rt femoral       Basic Metabolic Panel:  Recent Labs  Lab 06/22/17 1240 06/22/17 1647 06/22/17 2030 06/23/17 0008 06/23/17 0424 06/23/17 0815  NA 140 139 138 138 139  --   K 3.9 4.1 4.0 4.0 4.1  --   CL 107 105 106 106 106  --   CO2 _0 --   GLUCOSE 172* 145* 155* 182* 139*  --   BUN 84* 80* 81* 89* 79*  --   CREATININE 3.00* 2.82* 2.83* 2.96* 2.71*  --   CALCIUM 7.7* 7.7* 7.5* 7.3* 7.6*  --   MG 1.9 1.9 1.8 1.9 1.9 1.9  PHOS 4.1 4.1 3.6 3.5 4.0  --      CBC: Recent Labs  Lab 06/20/17 1445 06/21/17 0605 06/21/17 0700 06/23/17 0424   WBC 7.1 9.6 7.2 7.8  NEUTROABS 5.2  --   --  5.2  HGB 10.4* 6.1* 9.5* 8.5*  HCT 32.9* 19.4* 29.8* 26.6*  MCV 99.2 100.2* 98.6 97.0  PLT 164 184 150 93*     No results found for: HEPBSAG, HEPBSAB, HEPBIGM    Microbiology:  Recent Results (from the past 240 hour(s))  MRSA PCR Screening     Status: None   Collection Time: 06/21/17  5:00 AM  Result Value Ref Range Status   MRSA by PCR NEGATIVE NEGATIVE Final    Comment:        The GeneXpert MRSA Assay (FDA approved for NASAL specimens only), is one component of a comprehensive MRSA colonization surveillance program. It is not intended to diagnose MRSA infection nor to guide or monitor treatment for MRSA infections.   Culture, blood (Routine X 2) w Reflex to ID Panel     Status: None (Preliminary result)   Collection Time: 06/21/17  8:00 AM  Result Value Ref Range Status   Specimen Description BLOOD RIGHT ANTECUBITAL  Final   Special Requests   Final    BOTTLES DRAWN  AEROBIC AND ANAEROBIC Blood Culture results may not be optimal due to an excessive volume of blood received in culture bottles   Culture NO GROWTH 2 DAYS  Final   Report Status PENDING  Incomplete  Culture, blood (Routine X 2) w Reflex to ID Panel     Status: None (Preliminary result)   Collection Time: 06/21/17  8:04 AM  Result Value Ref Range Status   Specimen Description BLOOD BLOOD RIGHT FOREARM  Final   Special Requests   Final    BOTTLES DRAWN AEROBIC AND ANAEROBIC Blood Culture results may not be optimal due to an excessive volume of blood received in culture bottles   Culture NO GROWTH 2 DAYS  Final   Report Status PENDING  Incomplete    Coagulation Studies: Recent Labs    06/21/17 0605  LABPROT 15.1  INR 1.20    Urinalysis: No results for input(s): COLORURINE, LABSPEC, PHURINE, GLUCOSEU, HGBUR, BILIRUBINUR, KETONESUR, PROTEINUR, UROBILINOGEN, NITRITE, LEUKOCYTESUR in the last 72 hours.  Invalid input(s): APPERANCEUR    Imaging: US  Renal  Result Date: 06/21/2017 CLINICAL DATA:  Acute renal failure EXAM: RENAL ULTRASOUND COMPARISON:  None. FINDINGS: Right Kidney: Length: 12.2 cm. Echogenicity and renal cortical thickness are within normal limits. No perinephric fluid or hydronephrosis visualized. There is a cyst in the mid right kidney measuring 2.1 x 2.3 x 2.2 cm. There is a second nearby cyst measuring 1.8 x 1.5 x 1.5 cm. No sonographically demonstrable calculus or ureterectasis. Left Kidney: Length: 12.3 cm. Echogenicity and renal cortical thickness are within normal limits. No perinephric fluid or hydronephrosis visualized. There is a cyst in the lower pole left kidney measuring 2.2 x 2.5 x 2.4 cm. There is an adjacent lower pole left renal cyst measuring 2.4 x 1.8 x 1.8 cm. There is a smaller cyst more superiorly on the left measuring 1.4 x 1.4 x 1.4 cm. No sonographically demonstrable calculus or ureterectasis. Bladder: Foley catheter in place. Urinary bladder empty and cannot be assessed. IMPRESSION: There are small renal cysts bilaterally. No noncystic renal masses evident. No obstructing focus in either kidney. The renal cortical thickness and echogenicity are normal bilaterally. Urinary bladder decompressed and cannot be assessed. Electronically Signed   By: Lowella Grip III M.D.   On: 06/21/2017 11:28   Portable Chest Xray  Result Date: 06/22/2017 CLINICAL DATA:  Acute respiratory failure. EXAM: PORTABLE CHEST 1 VIEW COMPARISON:  One-view chest x-ray 06/21/2017. FINDINGS: The endotracheal tube is stable. A right IJ line is stable. NG tube courses off the inferior border of the film. Lung volumes remain low. Small bilateral pleural effusions and associated airspace disease are stable. Aeration is slightly improved. Aortic atherosclerosis is again noted. IMPRESSION: 1. Slight improved aeration and decreasing pulmonary vascular congestion. 2. Persistent low lung volumes with bilateral pleural effusions and basilar airspace  disease, likely atelectasis. 3. The support apparatus is stable. Electronically Signed   By: San Morelle M.D.   On: 06/22/2017 07:13     Medications:   . famotidine (PEPCID) IV    . pureflow 3 each (06/23/17 0813)   . aspirin  81 mg Per Tube Daily  . chlorhexidine gluconate (MEDLINE KIT)  15 mL Mouth Rinse BID  . docusate  100 mg Per Tube BID  . feeding supplement (PRO-STAT SUGAR FREE 64)  60 mL Per Tube QID  . feeding supplement (VITAL HIGH PROTEIN)  1,000 mL Per Tube Q24H  . furosemide  40 mg Intravenous Q12H  . heparin  5,000 Units  Subcutaneous Q8H  . insulin aspart  0-15 Units Subcutaneous Q4H  . insulin detemir  10 Units Subcutaneous BID AC & HS  . ipratropium-albuterol  3 mL Nebulization Q6H  . mouth rinse  15 mL Mouth Rinse 10 times per day  . multivitamin  15 mL Per Tube Daily   acetaminophen **OR** acetaminophen, bisacodyl, fentaNYL (SUBLIMAZE) injection, heparin, midazolam, [DISCONTINUED] ondansetron **OR** ondansetron (ZOFRAN) IV, sennosides  Assessment/ Plan:  76 y.o. caucasian male with chronic kidney disease stage IV baseline creatinine 2.5-3, coronary artery disease, hypertension, hyperlipidemia, left partial nephrectomy, gout, diabetes mellitus type II insulin dependent, diabetic peripheral neuropathy and osteoarthritis, who was admitted to [facility] on 06/20/2017 for SOB (shortness of breath) [R06.02] Hypoxia [R09.02] Acute renal failure, unspecified acute renal failure type ([place]) [N17  1.  Acute renal failure 2.  Hyperkalemia 3.  Chronic kidney stage IV 4.  Diabetes type 2 with chronic kidney disease 5.  Acute respiratory failure, requiring ventilator support 6.  Generalized edema  Continue CRRT, Currently on 4K bath. K 4.1; continue current bath Increase UF goal to 100 cc/hr then increase to 150 cc.hr by afternoon For now monitor urine output.  Currently getting furosemide 40 mg IV every 12 hours Monitor electrolytes closely   LOS:  3 [facility] 11/13/201810:22 AM  [place], [place]

## 2017-06-23 NOTE — Progress Notes (Addendum)
PULMONARY / CRITICAL CARE MEDICINE   Name: Christopher SagoRalph T [place] MRN: [id number] DOB: 14-Jul-1941    ADMISSION DATE:  06/20/2017  PT PROFILE:   [age] M with PMH of CAD, CKD adm 11/10 via ED with acute hypoxemic/hypercarbic resp failure, AKI, hyperkalemia, profound volume overload, mild edema pattern on CXR, anemia  MAJOR EVENTS/TEST RESULTS: 11/10 admission as above 11/11 PCCM consultation. Intubated after failed NIPPV 11/11 Nephrology consultation. CRRT initiated 11/11 echocardiogram: LVEF 50-55%.  Mild MR. LA size ULN 11/11 Renal US: small bilateral renal cysts.  No hydronephrosis  INDWELLING DEVICES:: ETT 11/11 >>  R IJ CVL 11/11 >>  R femoral HD cath 11/11 >>   MICRO DATA: MRSA PCR 11/11 >> NEG   Blood 11/11 >>   PCT 11/12 1.01,   ANTIMICROBIALS:  Pip-tazo 11/11 >> 11/12 Amp-sulbactam 11/12 >>      SUBJECTIVE:  Intubated, sedated.  Not F/C.  Tolerates PSB mode  VITAL SIGNS: BP 134/84   Pulse (!) 116   Temp (!) 100.9 F (38.3 C) (Oral)   Resp 19   Ht 5\' 8"  (1.727 m)   Wt 99.4 kg (219 lb 2.2 oz)   SpO2 91%   BMI 33.32 kg/m   HEMODYNAMICS:    VENTILATOR SETTINGS: Vent Mode: PRVC FiO2 (%):  [30 %] 30 % Set Rate:  [25 bmp] 25 bmp Vt Set:  [550 mL] 550 mL PEEP:  [5 cmH20] 5 cmH20 Plateau Pressure:  [9 cmH20] 9 cmH20  INTAKE / OUTPUT: I/O last 3 completed shifts: In: 2663.4 [I.V.:1833.4; NG/GT:530; IV Piggyback:300] Out: 1700 [Urine:1700]  PHYSICAL EXAMINATION: General: Intubated, sedated, RASS -3, not F/C Neuro: CNs intact, MAEs HEENT: NCAT, sclerae white Cardiovascular: Reg, no M  Lungs: clear anteriorly Abdomen: obese, soft, +BS Ext: very severe B symmetric pitting LE edema Skin: chronic LE venous stasis changes  LABS:  BMET Recent Labs  Lab 06/23/17 0424 06/23/17 0815 06/23/17 1147  NA 139 140 138  K 4.1 4.2 4.3  CL 106 105 105  CO2 23 24 22   BUN 79* 70* 67*  CREATININE 2.71* 2.52* 2.43*  GLUCOSE 139* 111* 169*    Electrolytes Recent  Labs  Lab 06/23/17 0424 06/23/17 0815 06/23/17 1147  CALCIUM 7.6* 8.0* 8.0*  MG 1.9 1.9 1.7  PHOS 4.0 4.2 3.9    CBC Recent Labs  Lab 06/21/17 0605 06/21/17 0700 06/23/17 0424  WBC 9.6 7.2 7.8  HGB 6.1* 9.5* 8.5*  HCT 19.4* 29.8* 26.6*  PLT 184 150 93*    Coag's Recent Labs  Lab 06/21/17 0605 06/22/17 0427 06/23/17 0424  APTT 35 37* 45*  INR 1.20  --   --     Sepsis Markers Recent Labs  Lab 06/21/17 0607 06/22/17 1240  LATICACIDVEN 1.0  --   PROCALCITON  --  1.01    ABG Recent Labs  Lab 06/21/17 0416 06/21/17 0608 06/21/17 2130  PHART 6.94* 7.19* 7.30*  PCO2ART 87* 40 43  PO2ART 70* 71* 75*    Liver Enzymes Recent Labs  Lab 06/20/17 1445 06/21/17 0605  06/23/17 0424 06/23/17 0815 06/23/17 1147  AST 21 14*  --   --   --   --   ALT 19 17  --   --   --   --   ALKPHOS 86 75  --   --   --   --   BILITOT 0.5 0.5  --   --   --   --   ALBUMIN 3.6 3.3*   < >  2.7* 2.7* 2.8*   < > = values in this interval not displayed.    Cardiac Enzymes Recent Labs  Lab 06/20/17 1445 06/21/17 0608  TROPONINI <0.03 0.03*    Glucose Recent Labs  Lab 06/22/17 1609 06/22/17 1952 06/23/17 0001 06/23/17 0354 06/23/17 0723 06/23/17 1134  GLUCAP 144* 152* 166* 168* 116* 137*    CXR: Bibasilar atelectasis, CM, mild vascular congestion    ASSESSMENT / PLAN:  PULMONARY A: Acute hypoxemic/hypercarbic respiratory failure Pulmonary edema P:   Cont vent support - settings reviewed and/or adjusted PSV mode as tolerated Cont vent bundle Daily SBT if/when meets criteria Continue empiric nebulized bronchodilators  CARDIOVASCULAR A:  H/O CAD CHFpEF Hypotension, resolved P:  Continue telemetry and hemodynamic monitoring MAP goal >65 mmHg  RENAL A:   AKI, now with nonoliguric CKD - baseline Cr 1.3 - 1.6 Severe hypervolemia P:   Monitor BMET intermittently Monitor I/Os Correct electrolytes as indicated  CRRT management per nephrology Begin  UF 11/13  GASTROINTESTINAL A:   Obesity P:   SUP: IV famotidine Cont TF protocol  HEMATOLOGIC A:   Anemia - acute on chronic P:  DVT px: SQ heparin Monitor CBC intermittently Transfuse per usual guidelines   INFECTIOUS A:   Possible aspiration P:   Monitor temp, WBC count Micro and abx as above Follow PCT  ENDOCRINE A:   DM 2, controlled P:   Continue Levemir  Continue SSI  NEUROLOGIC A:   Acute hypercarbic encephalopathy ICU/vent assoc discomfort P:   RASS goal: 0, -1 Discontinue propofol 11/13 Continue intermittent fentanyl, midazolam as needed Daily WUA  FAMILY  - Updates: Son and wife updated at bedside   CCM time: 45 mins The above time includes time spent in consultation with patient and/or family members and reviewing care plan on multidisciplinary rounds  Billy Fischeravid Simonds, MD PCCM service Mobile [phone] Pager [phone] 06/23/2017, 1:16 PM

## 2017-06-23 NOTE — Progress Notes (Addendum)
Pharmacy Electrolyte Monitoring Consult  Pharmacy consulted to assist in monitoring and replacing electrolytes in this 76 y.o.  male admitted with Shortness of Breath Patient is currently on CRRT.   Labs: Sodium (mmol/L)  Date Value  06/23/2017 138   Potassium (mmol/L)  Date Value  06/23/2017 4.3   Magnesium (mg/dL)  Date Value  16/10/960411/13/2018 1.7   Phosphorus (mg/dL)  Date Value  54/09/811911/13/2018 3.9   Calcium (mg/dL)  Date Value  14/78/295611/13/2018 8.0 (L)   Albumin (g/dL)  Date Value  21/30/865711/13/2018 2.8 (L)    Plan: Will order magnesium sulfate 1 g iv once and continue to follow q 4 hour renal function panel/magensium.   Luisa HartScott Christy, PharmD Clinical Pharmacist    11/13 PM electrolytes WNL. BMP in AM; Mg at next ordered interval.  11/14 0430 Electrolytes WNL. Recheck at next scheduled interval.  Fulton ReekMatt Janith Nielson, PharmD, BCPS  06/23/17 9:52 PM

## 2017-06-24 ENCOUNTER — Inpatient Hospital Stay: Payer: Medicare HMO

## 2017-06-24 DIAGNOSIS — E118 Type 2 diabetes mellitus with unspecified complications: Secondary | ICD-10-CM

## 2017-06-24 LAB — RENAL FUNCTION PANEL
ALBUMIN: 2.5 g/dL — AB (ref 3.5–5.0)
ALBUMIN: 2.5 g/dL — AB (ref 3.5–5.0)
Albumin: 2.6 g/dL — ABNORMAL LOW (ref 3.5–5.0)
Anion gap: 10 (ref 5–15)
Anion gap: 11 (ref 5–15)
Anion gap: 12 (ref 5–15)
BUN: 66 mg/dL — AB (ref 6–20)
BUN: 73 mg/dL — AB (ref 6–20)
BUN: 75 mg/dL — AB (ref 6–20)
CALCIUM: 7.8 mg/dL — AB (ref 8.9–10.3)
CHLORIDE: 106 mmol/L (ref 101–111)
CO2: 21 mmol/L — AB (ref 22–32)
CO2: 23 mmol/L (ref 22–32)
CO2: 24 mmol/L (ref 22–32)
CREATININE: 2.45 mg/dL — AB (ref 0.61–1.24)
CREATININE: 2.61 mg/dL — AB (ref 0.61–1.24)
CREATININE: 2.65 mg/dL — AB (ref 0.61–1.24)
Calcium: 7.7 mg/dL — ABNORMAL LOW (ref 8.9–10.3)
Calcium: 7.9 mg/dL — ABNORMAL LOW (ref 8.9–10.3)
Chloride: 103 mmol/L (ref 101–111)
Chloride: 105 mmol/L (ref 101–111)
GFR calc Af Amer: 26 mL/min — ABNORMAL LOW (ref >60)
GFR calc Af Amer: 28 mL/min — ABNORMAL LOW (ref >60)
GFR calc non Af Amer: 23 mL/min — ABNORMAL LOW (ref >60)
GFR, EST AFRICAN AMERICAN: 26 mL/min — AB (ref >60)
GFR, EST NON AFRICAN AMERICAN: 24 mL/min — AB (ref >60)
GFR, EST NON AFRICAN AMERICAN: 22 mL/min — AB (ref >60)
GLUCOSE: 187 mg/dL — AB (ref 65–99)
Glucose, Bld: 192 mg/dL — ABNORMAL HIGH (ref 65–99)
Glucose, Bld: 196 mg/dL — ABNORMAL HIGH (ref 65–99)
PHOSPHORUS: 4.9 mg/dL — AB (ref 2.5–4.6)
PHOSPHORUS: 5.3 mg/dL — AB (ref 2.5–4.6)
POTASSIUM: 3.9 mmol/L (ref 3.5–5.1)
Phosphorus: 5.1 mg/dL — ABNORMAL HIGH (ref 2.5–4.6)
Potassium: 3.7 mmol/L (ref 3.5–5.1)
Potassium: 4.1 mmol/L (ref 3.5–5.1)
SODIUM: 138 mmol/L (ref 135–145)
Sodium: 138 mmol/L (ref 135–145)
Sodium: 139 mmol/L (ref 135–145)

## 2017-06-24 LAB — BASIC METABOLIC PANEL
ANION GAP: 8 (ref 5–15)
Anion gap: 14 (ref 5–15)
BUN: 73 mg/dL — ABNORMAL HIGH (ref 6–20)
BUN: 76 mg/dL — AB (ref 6–20)
CALCIUM: 7.7 mg/dL — AB (ref 8.9–10.3)
CALCIUM: 8 mg/dL — AB (ref 8.9–10.3)
CO2: 25 mmol/L (ref 22–32)
CO2: 24 mmol/L (ref 22–32)
CREATININE: 2.82 mg/dL — AB (ref 0.61–1.24)
Chloride: 106 mmol/L (ref 101–111)
Chloride: 103 mmol/L (ref 101–111)
Creatinine, Ser: 2.45 mg/dL — ABNORMAL HIGH (ref 0.61–1.24)
GFR calc Af Amer: 24 mL/min — ABNORMAL LOW (ref >60)
GFR calc non Af Amer: 21 mL/min — ABNORMAL LOW (ref >60)
GFR, EST AFRICAN AMERICAN: 28 mL/min — AB (ref >60)
GFR, EST NON AFRICAN AMERICAN: 24 mL/min — AB (ref >60)
GLUCOSE: 132 mg/dL — AB (ref 65–99)
Glucose, Bld: 195 mg/dL — ABNORMAL HIGH (ref 65–99)
POTASSIUM: 3.9 mmol/L (ref 3.5–5.1)
POTASSIUM: 3.9 mmol/L (ref 3.5–5.1)
Sodium: 139 mmol/L (ref 135–145)
Sodium: 141 mmol/L (ref 135–145)

## 2017-06-24 LAB — GLUCOSE, CAPILLARY
Glucose-Capillary: 111 mg/dL — ABNORMAL HIGH (ref 65–99)
Glucose-Capillary: 133 mg/dL — ABNORMAL HIGH (ref 65–99)
Glucose-Capillary: 146 mg/dL — ABNORMAL HIGH (ref 65–99)
Glucose-Capillary: 148 mg/dL — ABNORMAL HIGH (ref 65–99)
Glucose-Capillary: 167 mg/dL — ABNORMAL HIGH (ref 65–99)
Glucose-Capillary: 199 mg/dL — ABNORMAL HIGH (ref 65–99)

## 2017-06-24 LAB — CBC
HCT: 25.2 % — ABNORMAL LOW (ref 40.0–52.0)
Hemoglobin: 8.2 g/dL — ABNORMAL LOW (ref 13.0–18.0)
MCH: 31.2 pg (ref 26.0–34.0)
MCHC: 32.4 g/dL (ref 32.0–36.0)
MCV: 96.5 fL (ref 80.0–100.0)
PLATELETS: 48 10*3/uL — AB (ref 150–440)
RBC: 2.61 MIL/uL — AB (ref 4.40–5.90)
RDW: 15.2 % — AB (ref 11.5–14.5)
WBC: 8.6 10*3/uL (ref 3.8–10.6)

## 2017-06-24 LAB — MAGNESIUM
MAGNESIUM: 2 mg/dL (ref 1.7–2.4)
MAGNESIUM: 2 mg/dL (ref 1.7–2.4)
MAGNESIUM: 2 mg/dL (ref 1.7–2.4)

## 2017-06-24 LAB — APTT: aPTT: 46 seconds — ABNORMAL HIGH (ref 24–36)

## 2017-06-24 MED ORDER — ACETAMINOPHEN 325 MG PO TABS
650.0000 mg | ORAL_TABLET | Freq: Four times a day (QID) | ORAL | Status: DC | PRN
  Administered 2017-06-24 – 2017-06-26 (×2): 650 mg via ORAL
  Filled 2017-06-24: qty 2

## 2017-06-24 MED ORDER — POTASSIUM CHLORIDE 20 MEQ/15ML (10%) PO SOLN
30.0000 meq | Freq: Once | ORAL | Status: AC
  Administered 2017-06-24: 30 meq
  Filled 2017-06-24: qty 30

## 2017-06-24 MED ORDER — ORAL CARE MOUTH RINSE
15.0000 mL | Freq: Two times a day (BID) | OROMUCOSAL | Status: DC
  Administered 2017-06-26 – 2017-07-05 (×11): 15 mL via OROMUCOSAL

## 2017-06-24 MED ORDER — MIDAZOLAM HCL 2 MG/2ML IJ SOLN
2.0000 mg | Freq: Once | INTRAMUSCULAR | Status: AC
  Administered 2017-06-24: 2 mg via INTRAVENOUS

## 2017-06-24 MED ORDER — ACETAMINOPHEN 325 MG PO TABS
ORAL_TABLET | ORAL | Status: AC
  Filled 2017-06-24: qty 2

## 2017-06-24 MED ORDER — CHLORHEXIDINE GLUCONATE 0.12 % MT SOLN: 15.0000 mL | Freq: Two times a day (BID) | OROMUCOSAL | Status: DC

## 2017-06-24 NOTE — Progress Notes (Signed)
Pharmacy Electrolyte Monitoring Consult  Pharmacy consulted to assist in monitoring and replacing electrolytes in this 76 y.o.  male admitted with Shortness of Breath Patient is no longer on CRRT. Patient is still receiving lasix 40mg  Q12H.   Labs: Sodium (mmol/L)  Date Value  06/24/2017 138   Potassium (mmol/L)  Date Value  06/24/2017 3.7   Magnesium (mg/dL)  Date Value  78/29/562111/14/2018 2.0   Phosphorus (mg/dL)  Date Value  30/86/578411/14/2018 5.3 (H)   Calcium (mg/dL)  Date Value  69/62/952811/14/2018 7.8 (L)   Albumin (g/dL)  Date Value  41/32/440111/14/2018 2.5 (L)    Plan:  K = 3.7, MD replaced with oral potassium 30mEq once. No other replacement needed at this time. Pharmacy will continue to follow and replace as needed.   Yolanda BonineHannah Lifsey, PharmD Pharmacy Resident 06/24/17 2:02 PM

## 2017-06-24 NOTE — Progress Notes (Signed)
[employer] - [place] at [facility]   PATIENT NAME: Christopher GambleRalph [name]    MR#:  [id number]  DATE OF BIRTH:  April 14, 1941  SUBJECTIVE:  CHIEF COMPLAINT:   Chief Complaint  Patient presents with  . Shortness of Breath    Came with respi distress, had pulm edema, intubated in night and started on HD as have worsened renal func.   Remains on CRRT, ICU team is planning to give SBT today.  REVIEW OF SYSTEMS:   Intubated.  ROS  DRUG ALLERGIES:   Allergies  Allergen Reactions  . Neurontin [Gabapentin] Other (See Comments)    Swelling when taking everyday    VITALS:  Blood pressure (!) 142/74, pulse (!) 108, temperature 98.9 F (37.2 C), temperature source Oral, resp. rate 13, height 5\' 8"  (1.727 m), weight 100.7 kg (222 lb 0.1 oz), SpO2 97 %.  PHYSICAL EXAMINATION:  GENERAL:  76 y.o.-year-old patient lying in the bed with no acute distress.  EYES: Pupils equal, round, reactive to light and accommodation. No scleral icterus. Extraocular muscles intact.  HEENT: Head atraumatic, normocephalic. Oropharynx and nasopharynx clear. ETT in place. NECK:  Supple, no jugular venous distention. No thyroid enlargement, no tenderness.  LUNGS: Normal breath sounds bilaterally, no wheezing, rales,rhonchi or crepitation. No use of accessory muscles of respiration. ETT on vent support. CARDIOVASCULAR: S1, S2 normal. No murmurs, rubs, or gallops.  ABDOMEN: Soft, nontender, nondistended. Bowel sounds present. No organomegaly or mass.  EXTREMITIES: b/l pedal edema,no cyanosis, or clubbing. Right groin HD cath in place with CRRT. NEUROLOGIC: Intubated on vent support.  PSYCHIATRIC: The patient is on vent.  SKIN: No obvious rash, lesion, or ulcer.   Physical Exam LABORATORY PANEL:   CBC Recent Labs  Lab 06/24/17 0432  WBC 8.6  HGB 8.2*  HCT 25.2*  PLT 48*    ------------------------------------------------------------------------------------------------------------------  Chemistries  Recent Labs  Lab 06/21/17 0605  06/24/17 0432  NA 139   < > 139  K 5.9*   < > 3.9  CL 112*   < > 106  CO2 18*   < > 25  GLUCOSE 276*   < > 195*  BUN 150*   < > 73*  CREATININE 5.82*   < > 2.45*  CALCIUM 7.2*   < > 7.7*  MG 2.5*   < > 2.0  AST 14*  --   --   ALT 17  --   --   ALKPHOS 75  --   --   BILITOT 0.5  --   --    < > = values in this interval not displayed.   ------------------------------------------------------------------------------------------------------------------  Cardiac Enzymes Recent Labs  Lab 06/20/17 1445 06/21/17 0608  TROPONINI <0.03 0.03*   ------------------------------------------------------------------------------------------------------------------  RADIOLOGY:  Dg Chest Port 1 View  Result Date: 06/24/2017 CLINICAL DATA:  Respiratory failure. EXAM: PORTABLE CHEST 1 VIEW COMPARISON:  One-view chest x-ray 06/22/2017. FINDINGS: Endotracheal tube is stable. Lung volumes remain low. Mild pulmonary vascular congestion is present. A right IJ line is stable. Bibasilar atelectasis is present. Aortic atherosclerosis is noted. IMPRESSION: 1. Stable low lung volumes with mild pulmonary vascular congestion bibasilar airspace disease, likely atelectasis. 2. Support apparatus is stable. Electronically Signed   By: Marin Robertshristopher  Mattern M.D.   On: 06/24/2017 07:16    ASSESSMENT AND PLAN:   Active Problems:   Acute renal failure superimposed on stage 4 chronic kidney disease (HCC)   Acute respiratory failure (HCC)   Acute renal failure (HCC)  1.  Ac respi  failure due to Ac diastolic CHF    Concern for aspiration    Fluid overload due to Renal failure: Acute on chronic renal failure stage 4.      Patient is fluid overloaded resulting in hypoxia.     Appreciated nephro help to initiate CRRT. On IV lasix.     Intubated on  vent support.   Received Abx for 1 day, but stopped. Monitor. 2.  Diabetes mellitus type 2: Continue NPH per home regimen.  Gabapentin for neuropathic pain 3.  Coronary artery disease: Stable: Continue aspirin 4.  Hypertension: Controlled; Hold amlodipine and metoprolol. 5.  Gout: Controlled; hold allopurinol 6.  DVT prophylaxis: Heparin 7.  GI prophylaxis: None   All the records are reviewed and case discussed with Care Management/Social Workerr. Management plans discussed with the patient, family and they are in agreement.  CODE STATUS: Full.  TOTAL TIME TAKING CARE OF THIS PATIENT: 35 minutes.   Discussed with wife and son in room.  POSSIBLE D/C IN 2-3 DAYS, DEPENDING ON CLINICAL CONDITION.   Altamese DillingVACHHANI, Eufemia Prindle M.D on 06/24/2017   Between 7am to 6pm - Pager - [phone]  After 6pm go to [email or web address] - password [email or web address]  [facility]  Office  [phone]  CC: Primary care physician; System, Provider Not In  Note: This dictation was prepared with Dragon dictation along with smaller phrase technology. Any transcriptional errors that result from this process are unintentional.

## 2017-06-24 NOTE — Progress Notes (Signed)
PULMONARY / CRITICAL CARE MEDICINE   Name: Christopher [name] MRN: [id number] DOB: Oct 21, 1940    ADMISSION DATE:  06/20/2017  PT PROFILE:   [age] M with PMH of CAD, CKD adm 11/10 via ED with acute hypoxemic/hypercarbic resp failure, AKI, hyperkalemia, profound volume overload, mild edema pattern on CXR, anemia  MAJOR EVENTS/TEST RESULTS: 11/10 admission as above 11/11 PCCM consultation. Intubated after failed NIPPV 11/11 Nephrology consultation. CRRT initiated 11/11 echocardiogram: LVEF 50-55%.  Mild MR. LA size ULN 11/11 Renal US: small bilateral renal cysts.  No hydronephrosis 11/13 tolerating PSV mode.  HD catheter clotted. CRRT suspended.  11/14 passed SBT and extubated.  Tolerating well initially.  Cognition intact  INDWELLING DEVICES:: ETT 11/11 >> 11/14 R IJ CVL 11/11 >>  R femoral HD cath 11/11 >>   MICRO DATA: MRSA PCR 11/11 >> NEG   Blood 11/11 >> NEG  PCT 11/12 1.01          11/15    ANTIMICROBIALS:  Pip-tazo 11/11 >> 11/12 Amp-sulbactam 11/12 >> 11/14     SUBJECTIVE:  Passed SBT and extubated.  Tolerating well initially.  Cognition intact  VITAL SIGNS: BP (!) 158/80   Pulse (!) 112   Temp 98.8 F (37.1 C) (Oral)   Resp (!) 33   Ht 5\' 8"  (1.727 m)   Wt 100.7 kg (222 lb 0.1 oz)   SpO2 92%   BMI 33.76 kg/m   HEMODYNAMICS:    VENTILATOR SETTINGS: Vent Mode: PRVC FiO2 (%):  [30 %-40 %] 40 % Set Rate:  [25 bmp] 25 bmp Vt Set:  [550 mL] 550 mL PEEP:  [5 cmH20] 5 cmH20  INTAKE / OUTPUT: I/O last 3 completed shifts: In: 1293.9 [I.V.:508.6; NG/GT:435.3; IV Piggyback:350] Out: 3808 [Urine:2955; Other:853]  PHYSICAL EXAMINATION: General: Intubated, sedated, RASS 0, + F/C Neuro: CNs intact, MAEs HEENT: NCAT, sclerae white Cardiovascular: Reg, no M  Lungs: clear anteriorly Abdomen: obese, soft, +BS Ext: Severe but improving B symmetric pitting LE edema Skin: chronic LE venous stasis changes  LABS:  BMET Recent Labs  Lab 06/24/17 0430  06/24/17 0432 06/24/17 0756  NA 139 139 138  K 3.9 3.9 3.7  CL 106 106 103  CO2 21* 25 24  BUN 73* 73* 75*  CREATININE 2.61* 2.45* 2.65*  GLUCOSE 192* 195* 187*    Electrolytes Recent Labs  Lab 06/23/17 2354 06/24/17 0430 06/24/17 0432 06/24/17 0756  CALCIUM 7.9* 7.7* 7.7* 7.8*  MG 2.0  --  2.0 2.0  PHOS 4.9* 5.1*  --  5.3*    CBC Recent Labs  Lab 06/21/17 0700 06/23/17 0424 06/24/17 0432  WBC 7.2 7.8 8.6  HGB 9.5* 8.5* 8.2*  HCT 29.8* 26.6* 25.2*  PLT 150 93* 48*    Coag's Recent Labs  Lab 06/21/17 0605 06/22/17 0427 06/23/17 0424 06/24/17 0601  APTT 35 37* 45* 46*  INR 1.20  --   --   --     Sepsis Markers Recent Labs  Lab 06/21/17 0607 06/22/17 1240  LATICACIDVEN 1.0  --   PROCALCITON  --  1.01    ABG Recent Labs  Lab 06/21/17 0416 06/21/17 0608 06/21/17 2130  PHART 6.94* 7.19* 7.30*  PCO2ART 87* 40 43  PO2ART 70* 71* 75*    Liver Enzymes Recent Labs  Lab 06/20/17 1445 06/21/17 0605  06/23/17 2354 06/24/17 0430 06/24/17 0756  AST 21 14*  --   --   --   --   ALT 19 17  --   --   --   --  ALKPHOS 86 75  --   --   --   --   BILITOT 0.5 0.5  --   --   --   --   ALBUMIN 3.6 3.3*   < > 2.5* 2.6* 2.5*   < > = values in this interval not displayed.    Cardiac Enzymes Recent Labs  Lab 06/20/17 1445 06/21/17 0608  TROPONINI <0.03 0.03*    Glucose Recent Labs  Lab 06/23/17 1554 06/23/17 1939 06/23/17 2326 06/24/17 0401 06/24/17 0727 06/24/17 1147  GLUCAP 116* 155* 198* 199* 167* 148*    CXR: NSC Stable low lung volumes with mild pulmonary vascular congestion  bibasilar airspace disease, likely atelectasis    ASSESSMENT / PLAN:  PULMONARY A: Acute hypoxemic/hypercarbic respiratory failure Pulmonary edema - mild P:   Extubated this a.m. under my supervision Supplemental oxygen as needed to maintain SPO2 >92%   CARDIOVASCULAR A:  H/O CAD CHFpEF Hypotension, resolved P:  Continue telemetry and  hemodynamic monitoring MAP goal > 65 mmHg  RENAL A:   AKI, now nonoliguric CKD - baseline Cr 1.3 - 1.6 Severe hypervolemia -improving P:   Monitor BMET intermittently Monitor I/Os Correct electrolytes as indicated  Continue diuresis with furosemide Might need further intermittent HD Nephrology following  GASTROINTESTINAL A:   Obesity Post extubation dysphagia P:   SUP: N/I post extubation N.p.o. for now Consider SLP eval 11/15  HEMATOLOGIC A:   Anemia - acute on chronic Thrombocytopenia P:  DVT px: SCDs Monitor CBC intermittently Transfuse per usual guidelines  SQ heparin discontinued 11/14 due to thrombocytopenia  INFECTIOUS A:   Possible aspiration P:   Monitor temp, WBC count Micro and abx as above Recheck PCT 11/15  ENDOCRINE A:   DM 2, controlled P:   Continue Levemir  Continue SSI Once diet ordered, change SSI to ACHS  NEUROLOGIC A:   Acute hypercarbic encephalopathy -resolved Deconditioning P:   RASS goal: 0 Minimize all sedating medications PT eval ordered  FAMILY:: Son and wife updated at bedside   CCM time: 35 mins The above time includes time spent in consultation with patient and/or family members and reviewing care plan on multidisciplinary rounds  Christopher Fischer, MD PCCM service Mobile [phone] Pager [phone] 06/24/2017, 12:53 PM

## 2017-06-24 NOTE — Progress Notes (Signed)
[facility], [place] 06/24/17  Subjective:   Patient remains critically ill, but doing a lot better FiO2 40%.  Patient is awake and is able to follow commands.  There are plans for extubation today CRRT was stopped yesterday because of dialyzer clotting but his urine output improved therefore be discontinued CRRT last night.  Total urine output is 2365 last 24 hours  Alert, able to follow simple commands  Objective:  Vital signs in last 24 hours:  Temp:  [98.9 F (37.2 C)-100.9 F (38.3 C)] 98.9 F (37.2 C) (11/14 0736) Pulse Rate:  [99-131] 112 (11/14 0900) Resp:  [0-30] 19 (11/14 0900) BP: (100-163)/(67-89) 154/79 (11/14 0900) SpO2:  [91 %-100 %] 97 % (11/14 0900) FiO2 (%):  [30 %-40 %] 40 % (11/14 0816) Weight:  [100.7 kg (222 lb 0.1 oz)] 100.7 kg (222 lb 0.1 oz) (11/14 0500)  Weight change: 1.3 kg (2 lb 13.9 oz) Filed Weights   06/22/17 0427 06/23/17 0500 06/24/17 0500  Weight: 102 kg (224 lb 13.9 oz) 99.4 kg (219 lb 2.2 oz) 100.7 kg (222 lb 0.1 oz)    Intake/Output:    Intake/Output Summary (Last 24 hours) at 06/24/2017 0946 Last data filed at 06/24/2017 0900 Gross per 24 hour  Intake 877.69 ml  Output 3608 ml  Net -2730.31 ml     Physical Exam: General:  Critically ill-appearing  HEENT  ET tube in place  Neck  no masses  Pulm/lungs  ventilator assisted  CVS/Heart  no rub or gallop  Abdomen:   Soft  Extremities: ++ Significant dependent edema  Neurologic:  patient is alert and is able to follow simple commands     Access:  Temporary dialysis catheter; Rt femoral       Basic Metabolic Panel:  Recent Labs  Lab 06/23/17 1554 06/23/17 2107 06/23/17 2354 06/24/17 0430 06/24/17 0432 06/24/17 0756  NA 139 139 138 139 139 138  K 4.4 4.3 4.1 3.9 3.9 3.7  CL 105 105 105 106 106 103  CO2 '24 23 23 '$ 21* 25 24  GLUCOSE 142* 173* 196* 192* 195* 187*  BUN 60* 65* 66* 73* 73* 75*  CREATININE 2.19* 2.34* 2.45* 2.61* 2.45* 2.65*   CALCIUM 8.0* 7.8* 7.9* 7.7* 7.7* 7.8*  MG 2.0 2.0 2.0  --  2.0 2.0  PHOS 4.4 4.6 4.9* 5.1*  --  5.3*     CBC: Recent Labs  Lab 06/20/17 1445 06/21/17 0605 06/21/17 0700 06/23/17 0424 06/24/17 0432  WBC 7.1 9.6 7.2 7.8 8.6  NEUTROABS 5.2  --   --  5.2  --   HGB 10.4* 6.1* 9.5* 8.5* 8.2*  HCT 32.9* 19.4* 29.8* 26.6* 25.2*  MCV 99.2 100.2* 98.6 97.0 96.5  PLT 164 184 150 93* 48*     No results found for: HEPBSAG, HEPBSAB, HEPBIGM    Microbiology:  Recent Results (from the past 240 hour(s))  MRSA PCR Screening     Status: None   Collection Time: 06/21/17  5:00 AM  Result Value Ref Range Status   MRSA by PCR NEGATIVE NEGATIVE Final    Comment:        The GeneXpert MRSA Assay (FDA approved for NASAL specimens only), is one component of a comprehensive MRSA colonization surveillance program. It is not intended to diagnose MRSA infection nor to guide or monitor treatment for MRSA infections.   Culture, blood (Routine X 2) w Reflex to ID Panel     Status: None (Preliminary result)  Collection Time: 06/21/17  8:00 AM  Result Value Ref Range Status   Specimen Description BLOOD RIGHT ANTECUBITAL  Final   Special Requests   Final    BOTTLES DRAWN AEROBIC AND ANAEROBIC Blood Culture results may not be optimal due to an excessive volume of blood received in culture bottles   Culture NO GROWTH 3 DAYS  Final   Report Status PENDING  Incomplete  Culture, blood (Routine X 2) w Reflex to ID Panel     Status: None (Preliminary result)   Collection Time: 06/21/17  8:04 AM  Result Value Ref Range Status   Specimen Description BLOOD BLOOD RIGHT FOREARM  Final   Special Requests   Final    BOTTLES DRAWN AEROBIC AND ANAEROBIC Blood Culture results may not be optimal due to an excessive volume of blood received in culture bottles   Culture NO GROWTH 3 DAYS  Final   Report Status PENDING  Incomplete    Coagulation Studies: No results for input(s): LABPROT, INR in the last 72  hours.  Urinalysis: No results for input(s): COLORURINE, LABSPEC, PHURINE, GLUCOSEU, HGBUR, BILIRUBINUR, KETONESUR, PROTEINUR, UROBILINOGEN, NITRITE, LEUKOCYTESUR in the last 72 hours.  Invalid input(s): APPERANCEUR    Imaging: Dg Chest Port 1 View  Result Date: 06/24/2017 CLINICAL DATA:  Respiratory failure. EXAM: PORTABLE CHEST 1 VIEW COMPARISON:  One-view chest x-ray 06/22/2017. FINDINGS: Endotracheal tube is stable. Lung volumes remain low. Mild pulmonary vascular congestion is present. A right IJ line is stable. Bibasilar atelectasis is present. Aortic atherosclerosis is noted. IMPRESSION: 1. Stable low lung volumes with mild pulmonary vascular congestion bibasilar airspace disease, likely atelectasis. 2. Support apparatus is stable. Electronically Signed   By: San Morelle M.D.   On: 06/24/2017 07:16     Medications:    . aspirin  81 mg Per Tube Daily  . chlorhexidine gluconate (MEDLINE KIT)  15 mL Mouth Rinse BID  . furosemide  40 mg Intravenous Q12H  . heparin  5,000 Units Subcutaneous Q8H  . insulin aspart  0-15 Units Subcutaneous Q4H  . insulin detemir  10 Units Subcutaneous BID AC & HS  . ipratropium-albuterol  3 mL Nebulization Q6H  . mouth rinse  15 mL Mouth Rinse 10 times per day   bisacodyl, [DISCONTINUED] ondansetron **OR** ondansetron (ZOFRAN) IV  Assessment/ Plan:  76 y.o. caucasian male with chronic kidney disease stage IV baseline creatinine 2.5-3, coronary artery disease, hypertension, hyperlipidemia, left partial nephrectomy, gout, diabetes mellitus type II insulin dependent, diabetic peripheral neuropathy and osteoarthritis, who was admitted to [facility] on 06/20/2017 for SOB (shortness of breath) [R06.02] Hypoxia [R09.02] Acute renal failure, unspecified acute renal failure type ([place]) [N17  1.  Acute renal failure 2.  Hyperkalemia 3.  Chronic kidney stage IV 4.  Diabetes type 2 with chronic kidney disease 5.  Acute respiratory failure, requiring  ventilator support 6.  Generalized edema  Urine output has improved significantly.  Serum creatinine continues to climb slowly but is expected to start to improve in the next 1-2 days.  Maintain hemodynamic stability.For now monitor urine output.  Currently getting furosemide 40 mg IV every 12 hours. Monitor electrolytes closely Discontinue CRRT   LOS: 4 [facility] 11/14/20189:46 AM  [place], [place]

## 2017-06-24 NOTE — Progress Notes (Signed)
Patient extubated this morning, weaned down to 4L [name]. No respiratory distress throughout shift, patient placed on diet per [name], NP. Patient able to eat and drink without difficulty. Family present in room, updated on plan of care. UOP adequate for shift. [name]

## 2017-06-24 NOTE — Progress Notes (Signed)
Pt. Extubated and placed on nasal cannula at 6lnc,sat 94,no resp. Distress noted at this time.

## 2017-06-25 ENCOUNTER — Inpatient Hospital Stay: Payer: Medicare HMO

## 2017-06-25 DIAGNOSIS — J9602 Acute respiratory failure with hypercapnia: Secondary | ICD-10-CM

## 2017-06-25 LAB — GLUCOSE, CAPILLARY
Glucose-Capillary: 101 mg/dL — ABNORMAL HIGH (ref 65–99)
Glucose-Capillary: 108 mg/dL — ABNORMAL HIGH (ref 65–99)
Glucose-Capillary: 160 mg/dL — ABNORMAL HIGH (ref 65–99)
Glucose-Capillary: 151 mg/dL — ABNORMAL HIGH (ref 65–99)
Glucose-Capillary: 106 mg/dL — ABNORMAL HIGH (ref 65–99)
Glucose-Capillary: 116 mg/dL — ABNORMAL HIGH (ref 65–99)

## 2017-06-25 LAB — CBC
HCT: 27 % — ABNORMAL LOW (ref 40.0–52.0)
HEMOGLOBIN: 8.7 g/dL — AB (ref 13.0–18.0)
MCH: 31.2 pg (ref 26.0–34.0)
MCHC: 32.4 g/dL (ref 32.0–36.0)
MCV: 96.2 fL (ref 80.0–100.0)
PLATELETS: 116 10*3/uL — AB (ref 150–440)
RBC: 2.8 MIL/uL — AB (ref 4.40–5.90)
RDW: 14.5 % (ref 11.5–14.5)
WBC: 9.8 10*3/uL (ref 3.8–10.6)

## 2017-06-25 LAB — PROCALCITONIN: Procalcitonin: 1.6 ng/mL

## 2017-06-25 LAB — BASIC METABOLIC PANEL
Anion gap: 12 (ref 5–15)
BUN: 72 mg/dL — ABNORMAL HIGH (ref 6–20)
CALCIUM: 8.1 mg/dL — AB (ref 8.9–10.3)
CO2: 26 mmol/L (ref 22–32)
CREATININE: 2.91 mg/dL — AB (ref 0.61–1.24)
Chloride: 104 mmol/L (ref 101–111)
GFR, EST AFRICAN AMERICAN: 23 mL/min — AB (ref >60)
GFR, EST NON AFRICAN AMERICAN: 20 mL/min — AB (ref >60)
Glucose, Bld: 115 mg/dL — ABNORMAL HIGH (ref 65–99)
Potassium: 4 mmol/L (ref 3.5–5.1)
SODIUM: 142 mmol/L (ref 135–145)

## 2017-06-25 MED ORDER — IPRATROPIUM-ALBUTEROL 0.5-2.5 (3) MG/3ML IN SOLN: 3.0000 mL | RESPIRATORY_TRACT | Status: DC | PRN

## 2017-06-25 MED ORDER — METOPROLOL TARTRATE 25 MG PO TABS
25.0000 mg | ORAL_TABLET | Freq: Two times a day (BID) | ORAL | Status: DC
  Administered 2017-06-25 – 2017-06-27 (×5): 25 mg via ORAL
  Filled 2017-06-25 (×5): qty 1

## 2017-06-25 MED ORDER — INSULIN ASPART 100 UNIT/ML ~~LOC~~ SOLN
0.0000 [IU] | Freq: Every day | SUBCUTANEOUS | Status: DC
  Administered 2017-06-30 – 2017-07-01 (×2): 2 [IU] via SUBCUTANEOUS
  Filled 2017-06-25 (×2): qty 1

## 2017-06-25 MED ORDER — PREMIER PROTEIN SHAKE
11.0000 [oz_av] | Freq: Two times a day (BID) | ORAL | Status: DC
  Administered 2017-06-26 – 2017-06-29 (×5): 11 [oz_av] via ORAL

## 2017-06-25 MED ORDER — TEMAZEPAM 7.5 MG PO CAPS
7.5000 mg | ORAL_CAPSULE | Freq: Every evening | ORAL | Status: DC | PRN
  Administered 2017-06-25: 7.5 mg via ORAL
  Filled 2017-06-25: qty 1

## 2017-06-25 MED ORDER — INSULIN ASPART 100 UNIT/ML ~~LOC~~ SOLN
0.0000 [IU] | Freq: Three times a day (TID) | SUBCUTANEOUS | Status: DC
  Administered 2017-06-25: 3 [IU] via SUBCUTANEOUS
  Administered 2017-06-26 – 2017-06-27 (×5): 2 [IU] via SUBCUTANEOUS
  Administered 2017-06-28: 3 [IU] via SUBCUTANEOUS
  Administered 2017-06-29: 2 [IU] via SUBCUTANEOUS
  Administered 2017-06-29: 3 [IU] via SUBCUTANEOUS
  Administered 2017-06-30: 2 [IU] via SUBCUTANEOUS
  Administered 2017-06-30 – 2017-07-01 (×2): 8 [IU] via SUBCUTANEOUS
  Administered 2017-07-01: 2 [IU] via SUBCUTANEOUS
  Administered 2017-07-02 – 2017-07-04 (×4): 3 [IU] via SUBCUTANEOUS
  Administered 2017-07-04: 2 [IU] via SUBCUTANEOUS
  Administered 2017-07-05: 5 [IU] via SUBCUTANEOUS
  Administered 2017-07-06: 2 [IU] via SUBCUTANEOUS
  Administered 2017-07-06: 3 [IU] via SUBCUTANEOUS
  Administered 2017-07-07: 2 [IU] via SUBCUTANEOUS
  Filled 2017-06-25 (×23): qty 1

## 2017-06-25 NOTE — Progress Notes (Signed)
[employer] [facility] [place] [facility]   PATIENT NAME: Christopher GambleRalph Neider    MR#:  [id number]  DATE OF BIRTH:  12/09/1940  SUBJECTIVE:  CHIEF COMPLAINT:   Chief Complaint  Patient presents with  . Shortness of Breath    Came with respi distress, had pulm edema, intubated in night and started on HD as have worsened renal func.   successfully extubated today.  REVIEW OF SYSTEMS:     Review of Systems  Constitutional: Negative for fever and malaise/fatigue.  HENT: Negative for congestion, ear discharge and tinnitus.   Eyes: Negative for blurred vision.  Respiratory: Negative for cough, shortness of breath and wheezing.   Cardiovascular: Positive for leg swelling. Negative for chest pain, palpitations and orthopnea.  Gastrointestinal: Negative for abdominal pain, diarrhea, nausea and vomiting.  Genitourinary: Negative for dysuria and urgency.  Skin: Negative for rash.  Neurological: Positive for weakness. Negative for tremors, sensory change and focal weakness.  Psychiatric/Behavioral: Negative for depression and memory loss.    DRUG ALLERGIES:   Allergies  Allergen Reactions  . Neurontin [Gabapentin] Other (See Comments)    Swelling when taking everyday    VITALS:  Blood pressure 131/78, pulse (!) 101, temperature 99.7 F (37.6 C), temperature source Axillary, resp. rate (!) 21, height 5\' 8"  (1.727 m), weight 101.1 kg (222 lb 14.2 oz), SpO2 90 %.  PHYSICAL EXAMINATION:  GENERAL:  76 y.o.-year-old patient lying in the bed with no acute distress.  EYES: Pupils equal, round, reactive to light and accommodation. No scleral icterus. Extraocular muscles intact.  HEENT: Head atraumatic, normocephalic. Oropharynx and nasopharynx clear. NECK:  Supple, no jugular venous distention. No thyroid enlargement, no tenderness.  LUNGS: Normal breath sounds bilaterally, no wheezing, rales,rhonchi or crepitation. No use of accessory muscles of respiration.  CARDIOVASCULAR: S1, S2  normal. No murmurs, rubs, or gallops.  ABDOMEN: Soft, nontender, nondistended. Bowel sounds present. No organomegaly or mass.  EXTREMITIES: b/l pedal edema,no cyanosis, or clubbing. Right groin HD cath in place. NEUROLOGIC: Cranial nerves intact, Moves all 4 limbs spontaneously and follows commands, generalized weakness .  PSYCHIATRIC: The patient is alert and oriented  SKIN: No obvious rash, lesion, or ulcer.   Physical Exam LABORATORY PANEL:   CBC Recent Labs  Lab 06/25/17 0441  WBC 9.8  HGB 8.7*  HCT 27.0*  PLT 116*   ------------------------------------------------------------------------------------------------------------------  Chemistries  Recent Labs  Lab 06/21/17 0605  06/24/17 0756  06/25/17 0441  NA 139   < > 138   < > 142  K 5.9*   < > 3.7   < > 4.0  CL 112*   < > 103   < > 104  CO2 18*   < > 24   < > 26  GLUCOSE 276*   < > 187*   < > 115*  BUN 150*   < > 75*   < > 72*  CREATININE 5.82*   < > 2.65*   < > 2.91*  CALCIUM 7.2*   < > 7.8*   < > 8.1*  MG 2.5*   < > 2.0  --   --   AST 14*  --   --   --   --   ALT 17  --   --   --   --   ALKPHOS 75  --   --   --   --   BILITOT 0.5  --   --   --   --    < > =  values in this interval not displayed.   ------------------------------------------------------------------------------------------------------------------  Cardiac Enzymes Recent Labs  Lab 06/20/17 1445 06/21/17 0608  TROPONINI <0.03 0.03*   ------------------------------------------------------------------------------------------------------------------  RADIOLOGY:  Dg Chest Port 1 View  Result Date: 06/25/2017 CLINICAL DATA:  Respiratory failure EXAM: PORTABLE CHEST 1 VIEW COMPARISON:  Yesterday FINDINGS: Low volume chest with streaky opacities that are stable from yesterday. Stable heart size and mediastinal contours. Status post CABG. Right IJ catheter with tip at the upper cavoatrial junction. No edema, effusion, or pneumothorax. Postoperative  cervical spine and right shoulder. IMPRESSION: Lower volumes after extubation. Similar appearing atelectasis at the bases. Electronically Signed   By: Marnee SpringJonathon  Watts M.D.   On: 06/25/2017 07:05   Dg Chest Port 1 View  Result Date: 06/24/2017 CLINICAL DATA:  Respiratory failure. EXAM: PORTABLE CHEST 1 VIEW COMPARISON:  One-view chest x-ray 06/22/2017. FINDINGS: Endotracheal tube is stable. Lung volumes remain low. Mild pulmonary vascular congestion is present. A right IJ line is stable. Bibasilar atelectasis is present. Aortic atherosclerosis is noted. IMPRESSION: 1. Stable low lung volumes with mild pulmonary vascular congestion bibasilar airspace disease, likely atelectasis. 2. Support apparatus is stable. Electronically Signed   By: Marin Robertshristopher  Mattern M.D.   On: 06/24/2017 07:16    ASSESSMENT AND PLAN:   Active Problems:   Acute renal failure superimposed on stage 4 chronic kidney disease (HCC)   Acute respiratory failure (HCC)   Acute renal failure (HCC)  1.  Ac respi failure due to Ac diastolic CHF    Concern for aspiration     Fluid overload due to Renal failure: Acute on chronic renal failure stage 4.      Patient is fluid overloaded resulting in hypoxia.     Appreciated nephro help to initiate CRRT. On IV lasix.      Intubated on vent support.    Received Abx for 1 day, but stopped. Monitor.   nephro to decide continued need for HD.   Extubated 06/24/17. 2.  Diabetes mellitus type 2: Continue NPH per home regimen.  Gabapentin for neuropathic pain 3.  Coronary artery disease: Stable: Continue aspirin 4.  Hypertension: Controlled; Hold amlodipine and metoprolol. 5.  Gout: Controlled; hold allopurinol 6.  DVT prophylaxis: Heparin 7.  GI prophylaxis: None   All the records are reviewed and case discussed with Care Management/Social Workerr. Management plans discussed with the patient, family and they are in agreement.  CODE STATUS: Full.  TOTAL TIME TAKING CARE OF THIS  PATIENT: 35 minutes.   Discussed with wife in room.  POSSIBLE D/C IN 2-3 DAYS, DEPENDING ON CLINICAL CONDITION.   Altamese DillingVACHHANI, Brandyn Thien M.D on 06/25/2017   Between 7am to 6pm - Pager - [phone]  After 6pm go to [email or web address] - password [email or web address]  [facility]  Office  [phone]  CC: Primary care physician; System, Provider Not In  Note: This dictation was prepared with Dragon dictation along with smaller phrase technology. Any transcriptional errors that result from this process are unintentional.

## 2017-06-25 NOTE — Progress Notes (Signed)
Nutrition Follow-up  DOCUMENTATION CODES:   Obesity unspecified  INTERVENTION:  Provide Premier Protein po BID, each supplement provides 160 kcal and 30 grams of protein.   Encouraged adequate intake of calories and protein at meals.  NUTRITION DIAGNOSIS:   Inadequate oral intake related to decreased appetite as evidenced by per patient/family report.  Ongoing.  GOAL:   Patient will meet greater than or equal to 90% of their needs  Progressing.   MONITOR:   PO intake, Supplement acceptance, Labs, Weight trends, I & O's  REASON FOR ASSESSMENT:   Ventilator    ASSESSMENT:   76 year old male with PMHx of HTN, anxiety, CAD, hx MI 2003, HLD, DM type 2, chronic back pain, chronic kidney disease stage IV, hx partial nephrectomy in 2000 who presented with shortness of breath found to have acute on chronic kidney disease with volume overload resulting in hypoxia. Rapid response was called 11/11 due to low oxygen saturation and decreased responsiveness and was emergently intubated.  -Patient was extubated on 11/14 after passing SBT. No longer on CRRT. -Rectal tube was placed 11/14. -Diet was advanced to dysphagia 3 with thin liquids on 11/14.  Met with patient and his wife at bedside. Patient is currently on 4L [place]. He reports he was eating well PTA and his weight had been stable. He reports his appetite is starting to improve but is decreased from normal. He is finishing about 25% of meals per his report. He is not eating much protein and is having mainly soft foods. Per chart he only finished 15% of his breakfast this morning.  Medications reviewed and include: Lasix 40 mg Q12hrs, Novolog 0-15 units TID, Novolog 0-5 units QHS, Levemir 10 units BID.  Labs reviewed: CBG 101-160, BUN 72, Creatinine 2.91.  Weight trend: 101.1 kg on 11/15; weight stable from admission weight  Discussed with RN.  Diet Order:  DIET DYS 3 Room service appropriate? Yes; Fluid consistency:  Thin  EDUCATION NEEDS:   No education needs have been identified at this time  Skin:  Skin Assessment: Reviewed RN Assessment(abrasions to legs and knees)  Last BM:  06/25/2017 - medium type 7 rectal tube  Height:   Ht Readings from Last 1 Encounters:  06/20/17 '5\' 8"'$  (1.727 m)    Weight:   Wt Readings from Last 1 Encounters:  06/25/17 222 lb 14.2 oz (101.1 kg)    Ideal Body Weight:  70 kg  BMI:  Body mass index is 33.89 kg/m.  Estimated Nutritional Needs:   Kcal:  2075-2250 (MSJ x 1.2-1.3)  Protein:  100-120 grams (1-1.2 grams/kg)  Fluid:  1.7-2.1 L/day (25-30 mL/kg IBW)  Willey Blade, MS, RD, LDN Office: [phone] Pager: [phone] After Hours/Weekend Pager: [phone]

## 2017-06-25 NOTE — Progress Notes (Signed)
This RN was finishing documentation on this patient and saw an order to insert peripheral IV and d/c central line that had been entered after report had been called, and the patient had been transferred. Kenney Housemananya, RN was contacted and advised to call me and I would come remove central line if she would like for me to.

## 2017-06-25 NOTE — Progress Notes (Signed)
Transferred out from ICU 5.  A&O x3, no distress on 4LO2 per [place].  Cardiac monitor placed on pt and verified with Candace, CNA.  Pt denies chest pain.  Lungs diminished lower lobes bil.  Abrasion noted to lt knee.  Foley patent with yellow urine.  Flexiseal to drainage bag.  RT groin perm cath in place.  RT neck triple lumen in place.  Family at bedside.  POC reviewed with pt and family.  Denies need. CB in reach, SR up x 2.

## 2017-06-25 NOTE — Progress Notes (Signed)
Pharmacy Electrolyte Monitoring Consult  Pharmacy consulted to assist in monitoring and replacing electrolytes in this 76 y.o.  male admitted with Shortness of Breath Patient is no longer on CRRT. Patient is still receiving lasix 40mg  Q12H.   Labs: Sodium (mmol/L)  Date Value  06/25/2017 142   Potassium (mmol/L)  Date Value  06/25/2017 4.0   Magnesium (mg/dL)  Date Value  69/62/952811/14/2018 2.0   Phosphorus (mg/dL)  Date Value  41/32/440111/14/2018 5.3 (H)   Calcium (mg/dL)  Date Value  02/72/536611/15/2018 8.1 (L)   Albumin (g/dL)  Date Value  44/03/474211/14/2018 2.5 (L)    Plan:  K = 3.7, MD replaced with oral potassium 30mEq once. No other replacement needed at this time. Pharmacy will continue to follow and replace as needed.   11/15 AM electrolyte panel. No replacement indicated. Recheck BMP with AM labs.  Yolanda BonineHannah Lifsey, PharmD Pharmacy Resident 06/25/17 6:05 AM

## 2017-06-25 NOTE — Progress Notes (Signed)
[facility] [place], [place] 06/25/17  Subjective:   Patient remains critically ill, but doing a lot better Extubated Urine output at 2600 cc Serum creatinine slightly higher at 2.91  Objective:  Vital signs in last 24 hours:  Temp:  [98.5 F (36.9 C)-100.5 F (38.1 C)] 99.6 F (37.6 C) (11/15 0800) Pulse Rate:  [101-122] 114 (11/15 0944) Resp:  [20-35] 20 (11/15 0900) BP: (129-164)/(65-95) 144/65 (11/15 0944) SpO2:  [90 %-95 %] 93 % (11/15 0900) Weight:  [101.1 kg (222 lb 14.2 oz)] 101.1 kg (222 lb 14.2 oz) (11/15 0427)  Weight change: 0.4 kg (14.1 oz) Filed Weights   06/23/17 0500 06/24/17 0500 06/25/17 0427  Weight: 99.4 kg (219 lb 2.2 oz) 100.7 kg (222 lb 0.1 oz) 101.1 kg (222 lb 14.2 oz)    Intake/Output:    Intake/Output Summary (Last 24 hours) at 06/25/2017 0954 Last data filed at 06/25/2017 0800 Gross per 24 hour  Intake 156 ml  Output 2125 ml  Net -1969 ml     Physical Exam: General:  Critically ill-appearing  HEENT  nasal cannula oxygen   Neck  no masses  Pulm/lungs  coarse breath sounds bilaterally  CVS/Heart  no rub or gallop  Abdomen:   Soft  Extremities: ++ Significant dependent edema  Neurologic:  patient is alert and is able to follow simple commands     Access:  Temporary dialysis catheter; Rt femoral       Basic Metabolic Panel:  Recent Labs  Lab 06/23/17 1554 06/23/17 2107 06/23/17 2354 06/24/17 0430 06/24/17 0432 06/24/17 0756 06/24/17 1501 06/25/17 0441  NA 139 139 138 139 139 138 141 142  K 4.4 4.3 4.1 3.9 3.9 3.7 3.9 4.0  CL 105 105 105 106 106 103 103 104  CO2 24 23 23  21* 25 24 24 26   GLUCOSE 142* 173* 196* 192* 195* 187* 132* 115*  BUN 60* 65* 66* 73* 73* 75* 76* 72*  CREATININE 2.19* 2.34* 2.45* 2.61* 2.45* 2.65* 2.82* 2.91*  CALCIUM 8.0* 7.8* 7.9* 7.7* 7.7* 7.8* 8.0* 8.1*  MG 2.0 2.0 2.0  --  2.0 2.0  --   --   PHOS 4.4 4.6 4.9* 5.1*  --  5.3*  --   --      CBC: Recent Labs  Lab  06/20/17 1445 06/21/17 0605 06/21/17 0700 06/23/17 0424 06/24/17 0432 06/25/17 0441  WBC 7.1 9.6 7.2 7.8 8.6 9.8  NEUTROABS 5.2  --   --  5.2  --   --   HGB 10.4* 6.1* 9.5* 8.5* 8.2* 8.7*  HCT 32.9* 19.4* 29.8* 26.6* 25.2* 27.0*  MCV 99.2 100.2* 98.6 97.0 96.5 96.2  PLT 164 184 150 93* 48* 116*     No results found for: HEPBSAG, HEPBSAB, HEPBIGM    Microbiology:  Recent Results (from the past 240 hour(s))  MRSA PCR Screening     Status: None   Collection Time: 06/21/17  5:00 AM  Result Value Ref Range Status   MRSA by PCR NEGATIVE NEGATIVE Final    Comment:        The GeneXpert MRSA Assay (FDA approved for NASAL specimens only), is one component of a comprehensive MRSA colonization surveillance program. It is not intended to diagnose MRSA infection nor to guide or monitor treatment for MRSA infections.   Culture, blood (Routine X 2) w Reflex to ID Panel     Status: None (Preliminary result)   Collection Time: 06/21/17  8:00 AM  Result Value Ref  Range Status   Specimen Description BLOOD RIGHT ANTECUBITAL  Final   Special Requests   Final    BOTTLES DRAWN AEROBIC AND ANAEROBIC Blood Culture results may not be optimal due to an excessive volume of blood received in culture bottles   Culture NO GROWTH 4 DAYS  Final   Report Status PENDING  Incomplete  Culture, blood (Routine X 2) w Reflex to ID Panel     Status: None (Preliminary result)   Collection Time: 06/21/17  8:04 AM  Result Value Ref Range Status   Specimen Description BLOOD BLOOD RIGHT FOREARM  Final   Special Requests   Final    BOTTLES DRAWN AEROBIC AND ANAEROBIC Blood Culture results may not be optimal due to an excessive volume of blood received in culture bottles   Culture NO GROWTH 4 DAYS  Final   Report Status PENDING  Incomplete    Coagulation Studies: No results for input(s): LABPROT, INR in the last 72 hours.  Urinalysis: No results for input(s): COLORURINE, LABSPEC, PHURINE, GLUCOSEU, HGBUR,  BILIRUBINUR, KETONESUR, PROTEINUR, UROBILINOGEN, NITRITE, LEUKOCYTESUR in the last 72 hours.  Invalid input(s): APPERANCEUR    Imaging: Dg Chest Port 1 View  Result Date: 06/25/2017 CLINICAL DATA:  Respiratory failure EXAM: PORTABLE CHEST 1 VIEW COMPARISON:  Yesterday FINDINGS: Low volume chest with streaky opacities that are stable from yesterday. Stable heart size and mediastinal contours. Status post CABG. Right IJ catheter with tip at the upper cavoatrial junction. No edema, effusion, or pneumothorax. Postoperative cervical spine and right shoulder. IMPRESSION: Lower volumes after extubation. Similar appearing atelectasis at the bases. Electronically Signed   By: Marnee SpringJonathon  Watts M.D.   On: 06/25/2017 07:05   Dg Chest Port 1 View  Result Date: 06/24/2017 CLINICAL DATA:  Respiratory failure. EXAM: PORTABLE CHEST 1 VIEW COMPARISON:  One-view chest x-ray 06/22/2017. FINDINGS: Endotracheal tube is stable. Lung volumes remain low. Mild pulmonary vascular congestion is present. A right IJ line is stable. Bibasilar atelectasis is present. Aortic atherosclerosis is noted. IMPRESSION: 1. Stable low lung volumes with mild pulmonary vascular congestion bibasilar airspace disease, likely atelectasis. 2. Support apparatus is stable. Electronically Signed   By: Marin Robertshristopher  Mattern M.D.   On: 06/24/2017 07:16     Medications:    . acetaminophen      . aspirin  81 mg Per Tube Daily  . furosemide  40 mg Intravenous Q12H  . insulin aspart  0-15 Units Subcutaneous TID WC  . insulin aspart  0-5 Units Subcutaneous QHS  . insulin detemir  10 Units Subcutaneous BID AC & HS  . mouth rinse  15 mL Mouth Rinse q12n4p  . metoprolol tartrate  25 mg Oral BID   acetaminophen, ipratropium-albuterol, [DISCONTINUED] ondansetron **OR** ondansetron (ZOFRAN) IV  Assessment/ Plan:  76 y.o. caucasian male with chronic kidney disease stage IV baseline creatinine 2.5-3, coronary artery disease, hypertension,  hyperlipidemia, left partial nephrectomy, gout, diabetes mellitus type II insulin dependent, diabetic peripheral neuropathy and osteoarthritis, who was [facility] to [facility] 06/20/2017 for SOB (shortness of breath) [R06.02] Hypoxia [R09.02] Acute renal failure, unspecified acute renal failure type (HCC) [N17  1.  Acute renal failure 2.  Hyperkalemia 3.  Chronic kidney stage IV 4.  Diabetes type 2 with chronic kidney disease 5.  Acute respiratory failure, requiring ventilator support 6.  Generalized edema  Urine output has significantly improved.  2600 over the last 24 hours.  Serum creatinine slightly higher at 2.91 Still has significant volume overload.  Currently getting Lasix IV 40  mg twice a day Continue to monitor electrolytes and serum creatinine We will follow   LOS: 5 [facility] 11/15/20189:54 AM  [address] [place], [place] [phone]

## 2017-06-25 NOTE — Progress Notes (Signed)
Pharmacy Electrolyte Monitoring Consult  Pharmacy consulted to assist in monitoring and replacing electrolytes in this 76 y.o.  male admitted with Shortness of Breath Patient is no longer on CRRT. Patient is still receiving lasix 40mg  Q12H.   Labs: Sodium (mmol/L)  Date Value  06/25/2017 142   Potassium (mmol/L)  Date Value  06/25/2017 4.0   Magnesium (mg/dL)  Date Value  40/98/119111/14/2018 2.0   Phosphorus (mg/dL)  Date Value  47/82/956211/14/2018 5.3 (H)   Calcium (mg/dL)  Date Value  13/08/657811/15/2018 8.1 (L)   Albumin (g/dL)  Date Value  46/96/295211/14/2018 2.5 (L)    Plan:  Labs within normal limits. No replacement indicated. Recheck BMP as well as Mg and Phos with AM labs.  Yolanda BonineHannah Dimples Probus, PharmD Pharmacy Resident 06/25/17 11:32 AM

## 2017-06-25 NOTE — Evaluation (Signed)
Physical Therapy Evaluation Patient Details Name: Christopher [name] MRN: [id number] DOB: 31-Jul-1941 Today's Date: 06/25/2017   History of Present Illness  Pt is a 76 y.o. male presenting to hospital 06/20/17 with SOB (pt with significant SOB coming back from fishing trip; O2 noted to be 67% and EMS initiated CPAP).  Pt admitted to hospital with acute on chronic kidney disease (fluid overloaded resulting in hypoxia).  Pt transferred to CCU 06/21/17 secondary to unresponsiveness and respiratory distress.  Pt diagnosed with acute hypoxic/hypercarbic respiratory failure, acute pulmonary edema, acute CHF exacerbation, and acute on chronic renal failure.  Pt intubated 06/21/17 and extubated 06/24/17.  CRRT initiated 06/21/17 and discontinued 06/23/17 (pt now receiving HD via temporary R HD fem cath).  PMH includes anxiety, CKD, htn, MI, anginal pain, chronic back pain, DM, anterior fusion c-spine, B RCR, and L TKA.  Clinical Impression  Prior to hospital admission, pt was modified independent ambulating with SPC.  Pt lives with his wife in 1 level home with 1 STE with railing.  Discussed pt with nursing and determined pt with temporary fem HD dialysis catheter.  Per hospital protocol for temporary fem HD dialysis catheters, unable to assess OOB mobility at this time so performed allowed in bed ex's (nursing notified).  Overall pt appearing with generalized weakness d/t medical status/hospitalization.  Pt would benefit from skilled PT to address noted impairments and functional limitations (see below for any additional details).  D/t generalized weakness, pt may benefit from STR but will monitor pt's status and update discharge recommendations once able to perform OOB mobility and pt's functional mobility is able to be assessed.    Follow Up Recommendations SNF    Equipment Recommendations  Rolling walker with 5" wheels    Recommendations for Other Services       Precautions / Restrictions  Precautions Precautions: Fall Precaution Comments: R temporary femoral HD catheter restrictions Restrictions Weight Bearing Restrictions: No      Mobility  Bed Mobility               General bed mobility comments: Deferred d/t R temporary fem HD cath restrictions  Transfers                 General transfer comment: Deferred d/t R temporary fem HD cath restrictions  Ambulation/Gait             General Gait Details: Deferred d/t R temporary fem HD cath restrictions  Stairs            Wheelchair Mobility    Modified Rankin (Stroke Patients Only)       Balance Overall balance assessment: (Deferred d/t R temporary fem HD cath restrictions)                                           Pertinent Vitals/Pain Pain Assessment: No/denies pain     Home Living Family/patient expects to be discharged to:: Private residence Living Arrangements: Spouse/significant other Available Help at Discharge: Family Type of Home: House Home Access: Stairs to enter Entrance Stairs-Rails: Right Entrance Stairs-Number of Steps: 1 Home Layout: One level(does not go to basement) Home Equipment: Walker - 2 wheels;Cane - single point      Prior Function Level of Independence: Independent with assistive device(s)         Comments: Ambulates with SPC.  Active fisherman.  Recent fall a few days  prior to admission (tripped over speed bump when walking).     Hand Dominance        Extremity/Trunk Assessment   Upper Extremity Assessment Upper Extremity Assessment: (H/o shoulder issues (baseline unable to initiate AROM shoulder flexion but AAROM to grossly 120 degrees B); unable to supinate B UE's (chronic issue); B elbow flexion/extension at least 4/5; fair B hand grip)    Lower Extremity Assessment Lower Extremity Assessment: Generalized weakness(R hip deferred d/t R temporary fem HD cath restrictions; fair B quad set; requires assist with L LE  AAROM heelslide; able to perform L LE AROM hip abducation/adduction; at least 3/5 B LE DF/PF)       Communication   Communication: No difficulties  Cognition Arousal/Alertness: Awake/alert Behavior During Therapy: WFL for tasks assessed/performed Overall Cognitive Status: (Mild increased time to respond to questions and to remember answers (required some cueing from pt's wife))                                        General Comments General comments (skin integrity, edema, etc.): R temporary fem HD cath; rectal tube; foley.  Nursing cleared pt for participation in physical therapy.  Pt agreeable to PT session.  Pt's wife present during session.    Exercises Total Joint Exercises Ankle Circles/Pumps: AROM;Strengthening;Both;Supine(10 reps x2) Quad Sets: AROM;Strengthening;Both;Supine(10 reps x2) Short Arc Quad: AROM;Strengthening;Left;Supine(10 reps x2) Heel Slides: AAROM;Strengthening;Left;10 reps;Supine(10 reps x2) Hip ABduction/ADduction: AAROM;AROM;Strengthening;Left;10 reps;Supine  Pt required vc's for above ex's.   Assessment/Plan    PT Assessment Patient needs continued PT services  PT Problem List Decreased strength;Decreased activity tolerance;Decreased mobility;Decreased knowledge of use of DME;Decreased knowledge of precautions       PT Treatment Interventions DME instruction;Gait training;Stair training;Functional mobility training;Therapeutic activities;Therapeutic exercise;Balance training;Patient/family education    PT Goals (Current goals can be found in the Care Plan section)  Acute Rehab PT Goals Patient Stated Goal: to be able to walk PT Goal Formulation: With patient/family Time For Goal Achievement: 07/09/17 Potential to Achieve Goals: Good    Frequency Min 2X/week   Barriers to discharge   Question level of assist needs    Co-evaluation               AM-PAC PT "6 Clicks" Daily Activity  Outcome Measure Difficulty turning  over in bed (including adjusting bedclothes, sheets and blankets)?: Unable Difficulty moving from lying on back to sitting on the side of the bed? : Unable Difficulty sitting down on and standing up from a chair with arms (e.g., wheelchair, bedside commode, etc,.)?: Unable Help needed moving to and from a bed to chair (including a wheelchair)?: A Lot Help needed walking in hospital room?: Total Help needed climbing 3-5 steps with a railing? : Total 6 Click Score: 7    End of Session Equipment Utilized During Treatment: Gait belt;Oxygen Activity Tolerance: Patient tolerated treatment well Patient left: in bed;with call bell/phone within reach;with bed alarm set;with family/visitor present Nurse Communication: Mobility status;Precautions;Other (comment)(R temporary fem cath restrictions) PT Visit Diagnosis: Other abnormalities of gait and mobility (R26.89);History of falling (Z91.81);Muscle weakness (generalized) (M62.81)    Time: [id number] PT Time Calculation (min) (ACUTE ONLY): 20 min   Charges:   PT Evaluation $PT Eval Low Complexity: 1 Low PT Treatments $Therapeutic Exercise: 8-22 mins   PT G Codes:   PT G-Codes **NOT FOR INPATIENT CLASS** Functional Assessment Tool Used: AM-PAC 6 Clicks Basic  Mobility Functional Limitation: Mobility: Walking and moving around Mobility: Walking and Moving Around Current Status [phone]): At least 80 percent but less than 100 percent impaired, limited or restricted Mobility: Walking and Moving Around Goal Status [phone]): 0 percent impaired, limited or restricted    Hendricks Limes, PT 06/25/17, 10:08 AM [phone]

## 2017-06-25 NOTE — Progress Notes (Signed)
Report called to Kenney Housemananya, RN on 2A. Patient transported to room 251 w/ portable telemetry and oxygen via hospital bed by this RN.

## 2017-06-25 NOTE — Progress Notes (Signed)
[employer] [facility] [place] [facility]   PATIENT NAME: Christopher GambleRalph [name]    MR#:  [id number]  DATE OF BIRTH:  Nov 22, 1940  SUBJECTIVE:  CHIEF COMPLAINT:   Chief Complaint  Patient presents with  . Shortness of Breath    Came with respi distress, had pulm edema, intubated in night and started on HD as have worsened renal func.   successfully extubated 06/24/17.   No complains.  REVIEW OF SYSTEMS:     Review of Systems  Constitutional: Negative for fever and malaise/fatigue.  HENT: Negative for congestion, ear discharge and tinnitus.   Eyes: Negative for blurred vision.  Respiratory: Negative for cough, shortness of breath and wheezing.   Cardiovascular: Positive for leg swelling. Negative for chest pain, palpitations and orthopnea.  Gastrointestinal: Negative for abdominal pain, diarrhea, nausea and vomiting.  Genitourinary: Negative for dysuria and urgency.  Skin: Negative for rash.  Neurological: Positive for weakness. Negative for tremors, sensory change and focal weakness.  Psychiatric/Behavioral: Negative for depression and memory loss.    DRUG ALLERGIES:   Allergies  Allergen Reactions  . Neurontin [Gabapentin] Other (See Comments)    Swelling when taking everyday    VITALS:  Blood pressure (!) 142/70, pulse (!) 104, temperature 98.4 F (36.9 C), temperature source Oral, resp. rate 17, height 5\' 8"  (1.727 m), weight 101.1 kg (222 lb 14.2 oz), SpO2 93 %.  PHYSICAL EXAMINATION:  GENERAL:  76 y.o.-year-old patient lying in the bed with no acute distress.  EYES: Pupils equal, round, reactive to light and accommodation. No scleral icterus. Extraocular muscles intact.  HEENT: Head atraumatic, normocephalic. Oropharynx and nasopharynx clear. NECK:  Supple, no jugular venous distention. No thyroid enlargement, no tenderness.  LUNGS: Normal breath sounds bilaterally, no wheezing, rales,rhonchi or crepitation. No use of accessory muscles of respiration.   CARDIOVASCULAR: S1, S2 normal. No murmurs, rubs, or gallops.  ABDOMEN: Soft, nontender, nondistended. Bowel sounds present. No organomegaly or mass. Rectal tube and Foley in place. EXTREMITIES: b/l pedal edema,no cyanosis, or clubbing. Right groin HD cath in place. NEUROLOGIC: Cranial nerves intact, Moves all 4 limbs spontaneously and follows commands, generalized weakness .  PSYCHIATRIC: The patient is alert and oriented X3.  SKIN: No obvious rash, lesion, or ulcer.   Physical Exam LABORATORY PANEL:   CBC Recent Labs  Lab 06/25/17 0441  WBC 9.8  HGB 8.7*  HCT 27.0*  PLT 116*   ------------------------------------------------------------------------------------------------------------------  Chemistries  Recent Labs  Lab 06/21/17 0605  06/24/17 0756  06/25/17 0441  NA 139   < > 138   < > 142  K 5.9*   < > 3.7   < > 4.0  CL 112*   < > 103   < > 104  CO2 18*   < > 24   < > 26  GLUCOSE 276*   < > 187*   < > 115*  BUN 150*   < > 75*   < > 72*  CREATININE 5.82*   < > 2.65*   < > 2.91*  CALCIUM 7.2*   < > 7.8*   < > 8.1*  MG 2.5*   < > 2.0  --   --   AST 14*  --   --   --   --   ALT 17  --   --   --   --   ALKPHOS 75  --   --   --   --   BILITOT 0.5  --   --   --   --    < > =  values in this interval not displayed.   ------------------------------------------------------------------------------------------------------------------  Cardiac Enzymes Recent Labs  Lab 06/20/17 1445 06/21/17 0608  TROPONINI <0.03 0.03*   ------------------------------------------------------------------------------------------------------------------  RADIOLOGY:  Dg Chest Port 1 View  Result Date: 06/25/2017 CLINICAL DATA:  Respiratory failure EXAM: PORTABLE CHEST 1 VIEW COMPARISON:  Yesterday FINDINGS: Low volume chest with streaky opacities that are stable from yesterday. Stable heart size and mediastinal contours. Status post CABG. Right IJ catheter with tip at the upper cavoatrial  junction. No edema, effusion, or pneumothorax. Postoperative cervical spine and right shoulder. IMPRESSION: Lower volumes after extubation. Similar appearing atelectasis at the bases. Electronically Signed   By: Marnee SpringJonathon  Watts M.D.   On: 06/25/2017 07:05   Dg Chest Port 1 View  Result Date: 06/24/2017 CLINICAL DATA:  Respiratory failure. EXAM: PORTABLE CHEST 1 VIEW COMPARISON:  One-view chest x-ray 06/22/2017. FINDINGS: Endotracheal tube is stable. Lung volumes remain low. Mild pulmonary vascular congestion is present. A right IJ line is stable. Bibasilar atelectasis is present. Aortic atherosclerosis is noted. IMPRESSION: 1. Stable low lung volumes with mild pulmonary vascular congestion bibasilar airspace disease, likely atelectasis. 2. Support apparatus is stable. Electronically Signed   By: Marin Robertshristopher  Mattern M.D.   On: 06/24/2017 07:16    ASSESSMENT AND PLAN:   Active Problems:   Acute renal failure superimposed on stage 4 chronic kidney disease (HCC)   Acute respiratory failure (HCC)   Acute renal failure (HCC)  1.  Ac respi failure due to Ac diastolic CHF    Concern for aspiration     Fluid overload due to Renal failure: Acute on chronic renal failure stage 4.      Patient is fluid overloaded resulting in hypoxia.     Appreciated nephro help to initiate CRRT. On IV lasix.      Intubated on vent support.    Received Abx for 1 day, but stopped. Monitor.   nephro to decide continued need for HD.   Extubated 06/24/17.   On nasal canula oxygen now. Try to taper to room air.   Nephrology to decide further need for HD.  2.  Diabetes mellitus type 2: Continue levemir per home regimen.  Gabapentin for neuropathic pain 3.  Coronary artery disease: Stable: Continue aspirin 4.  Hypertension: Controlled; Hold amlodipine and metoprolol. 5.  Gout: Controlled; hold allopurinol 6.  DVT prophylaxis: Heparin 7.  GI prophylaxis: None  All the records are reviewed and case discussed with Care  Management/Social Workerr. Management plans discussed with the patient, family and they are in agreement.  CODE STATUS: Full.  TOTAL TIME TAKING CARE OF THIS PATIENT: 35 minutes.   Discussed with wife in room. Need PT eval for d.c planning.  POSSIBLE D/C IN 2-3 DAYS, DEPENDING ON CLINICAL CONDITION.   Altamese DillingVaibhavkumar Deaveon Schoen M.D on 06/25/2017   Between 7am to 6pm - Pager - [phone]  After 6pm go to [email or web address] - password [email or web address]  [facility]  Office  [phone]  CC: Primary care physician; System, Provider Not In  Note: This dictation was prepared with Dragon dictation along with smaller phrase technology. Any transcriptional errors that result from this process are unintentional.

## 2017-06-25 NOTE — Progress Notes (Signed)
PULMONARY / CRITICAL CARE MEDICINE   Name: Christopher [name] MRN: [id number] DOB: April 21, 1941    ADMISSION DATE:  06/20/2017  PT PROFILE:   [age] M with PMH of CAD, CKD adm 11/10 via ED with acute hypoxemic/hypercarbic resp failure, AKI, hyperkalemia, profound volume overload, mild edema pattern on CXR, anemia  MAJOR EVENTS/TEST RESULTS: 11/10 admission as above 11/11 PCCM consultation. Intubated after failed NIPPV 11/11 Nephrology consultation. CRRT initiated 11/11 echocardiogram: LVEF 50-55%.  Mild MR. LA size ULN 11/11 Renal US: small bilateral renal cysts.  No hydronephrosis 11/13 tolerating PSV mode.  HD catheter clotted. CRRT suspended.  11/14 passed SBT and extubated.  Tolerating well initially.  Cognition intact 11/15 comfortable on [place] O2.  Cognition intact.  Urine output much improved.  Hypervolemia much improved.  Creatinine rising slowly.  Transfer to MedSurg floor ordered.  INDWELLING DEVICES:: ETT 11/11 >> 11/14 R IJ CVL 11/11 >> removal ordered 11/15 R femoral HD cath 11/11 >>   MICRO DATA: MRSA PCR 11/11 >> NEG   Blood 11/11 >> NEG  PCT 11/12 1.01          11/15 1.60   ANTIMICROBIALS:  Pip-tazo 11/11 >> 11/12 Amp-sulbactam 11/12 >> 11/14  SUBJECTIVE:  No distress, no complaints, appears comfortable on [place] O2.  Well oriented.  VITAL SIGNS: BP (!) 152/92   Pulse (!) 101   Temp 99.6 F (37.6 C) (Axillary)   Resp (!) 25   Ht 5\' 8"  (1.727 m)   Wt 101.1 kg (222 lb 14.2 oz)   SpO2 96%   BMI 33.89 kg/m   HEMODYNAMICS:    VENTILATOR SETTINGS:    INTAKE / OUTPUT: I/O last 3 completed shifts: In: 616 [NG/GT:616] Out: 3875 [Urine:3875]  PHYSICAL EXAMINATION: General: NAD Neuro: No focal deficits HEENT: NCAT, sclerae white Cardiovascular: Reg, no M  Lungs: clear anteriorly Abdomen: obese, soft, +BS Ext: 2-3+ improving B symmetric pitting LE edema Skin: chronic LE venous stasis changes  LABS:  BMET Recent Labs  Lab 06/24/17 0756 06/24/17 1501  06/25/17 0441  NA 138 141 142  K 3.7 3.9 4.0  CL 103 103 104  CO2 24 24 26   BUN 75* 76* 72*  CREATININE 2.65* 2.82* 2.91*  GLUCOSE 187* 132* 115*    Electrolytes Recent Labs  Lab 06/23/17 2354 06/24/17 0430 06/24/17 0432 06/24/17 0756 06/24/17 1501 06/25/17 0441  CALCIUM 7.9* 7.7* 7.7* 7.8* 8.0* 8.1*  MG 2.0  --  2.0 2.0  --   --   PHOS 4.9* 5.1*  --  5.3*  --   --     CBC Recent Labs  Lab 06/23/17 0424 06/24/17 0432 06/25/17 0441  WBC 7.8 8.6 9.8  HGB 8.5* 8.2* 8.7*  HCT 26.6* 25.2* 27.0*  PLT 93* 48* 116*    Coag's Recent Labs  Lab 06/21/17 0605 06/22/17 0427 06/23/17 0424 06/24/17 0601  APTT 35 37* 45* 46*  INR 1.20  --   --   --     Sepsis Markers Recent Labs  Lab 06/21/17 0607 06/22/17 1240 06/25/17 0441  LATICACIDVEN 1.0  --   --   PROCALCITON  --  1.01 1.60    ABG Recent Labs  Lab 06/21/17 0416 06/21/17 0608 06/21/17 2130  PHART 6.94* 7.19* 7.30*  PCO2ART 87* 40 43  PO2ART 70* 71* 75*    Liver Enzymes Recent Labs  Lab 06/20/17 1445 06/21/17 0605  06/23/17 2354 06/24/17 0430 06/24/17 0756  AST 21 14*  --   --   --   --  ALT 19 17  --   --   --   --   ALKPHOS 86 75  --   --   --   --   BILITOT 0.5 0.5  --   --   --   --   ALBUMIN 3.6 3.3*   < > 2.5* 2.6* 2.5*   < > = values in this interval not displayed.    Cardiac Enzymes Recent Labs  Lab 06/20/17 1445 06/21/17 0608  TROPONINI <0.03 0.03*    Glucose Recent Labs  Lab 06/24/17 1939 06/24/17 2339 06/25/17 0407 06/25/17 0714 06/25/17 1202 06/25/17 1418  GLUCAP 133* 111* 101* 108* 160* 151*    CXR: NSC low lung volumes without overt edema or infiltrates    ASSESSMENT / PLAN: Acute hypoxemic/hypercarbic respiratory failure Pulmonary edema - resolved Atelectasis H/O CAD CHFpEF Hypotension, resolved AKI, now nonoliguric CKD - baseline Cr 1.3 - 1.6 Severe hypervolemia -improving Obesity Anemia - acute on chronic Thrombocytopenia -improving.  Note:  Subcutaneous heparin discontinued 11/14 Mildly elevated PCT without obvious infection DM 2, controlled Acute hypercarbic encephalopathy -resolved Deconditioning  Transfer to MedSurg floor with cardiac monitoring Cont supplemental oxygen as needed to maintain SPO2 >92% Monitor BMET intermittently Monitor I/Os Correct electrolytes as indicated  Continue diuresis with furosemide Nephrology following Will leave HD catheter for now.  If no further dialysis needed, please remove Advance diet as able Consider SLP eval 11/15 DVT px: SCDs Monitor CBC intermittently Transfuse per usual guidelines   Continue Levemir  Continue SSI -changed to ACHS 11/15 PT following  FAMILY:: Son and wife updated at bedside  After transfer, PCCM will sign off. Please call if we can be of further assistance   Christopher Fischeravid Simonds, MD PCCM service Mobile [phone] Pager [phone] 06/25/2017, 3:04 PM

## 2017-06-26 ENCOUNTER — Inpatient Hospital Stay: Payer: Medicare HMO

## 2017-06-26 LAB — CULTURE, BLOOD (ROUTINE X 2)
CULTURE: NO GROWTH
CULTURE: NO GROWTH

## 2017-06-26 LAB — CBC
HCT: 26.6 % — ABNORMAL LOW (ref 40.0–52.0)
Hemoglobin: 8.8 g/dL — ABNORMAL LOW (ref 13.0–18.0)
MCH: 32.6 pg (ref 26.0–34.0)
MCHC: 33 g/dL (ref 32.0–36.0)
MCV: 98.8 fL (ref 80.0–100.0)
PLATELETS: 145 10*3/uL — AB (ref 150–440)
RBC: 2.69 MIL/uL — ABNORMAL LOW (ref 4.40–5.90)
RDW: 14.8 % — AB (ref 11.5–14.5)
WBC: 10.2 10*3/uL (ref 3.8–10.6)

## 2017-06-26 LAB — BASIC METABOLIC PANEL
ANION GAP: 12 (ref 5–15)
BUN: 75 mg/dL — AB (ref 6–20)
CHLORIDE: 102 mmol/L (ref 101–111)
CO2: 26 mmol/L (ref 22–32)
Calcium: 8.2 mg/dL — ABNORMAL LOW (ref 8.9–10.3)
Creatinine, Ser: 3.03 mg/dL — ABNORMAL HIGH (ref 0.61–1.24)
GFR calc Af Amer: 22 mL/min — ABNORMAL LOW (ref >60)
GFR, EST NON AFRICAN AMERICAN: 19 mL/min — AB (ref >60)
GLUCOSE: 111 mg/dL — AB (ref 65–99)
POTASSIUM: 3.7 mmol/L (ref 3.5–5.1)
Sodium: 140 mmol/L (ref 135–145)

## 2017-06-26 LAB — GLUCOSE, CAPILLARY
Glucose-Capillary: 107 mg/dL — ABNORMAL HIGH (ref 65–99)
Glucose-Capillary: 133 mg/dL — ABNORMAL HIGH (ref 65–99)
Glucose-Capillary: 145 mg/dL — ABNORMAL HIGH (ref 65–99)
Glucose-Capillary: 135 mg/dL — ABNORMAL HIGH (ref 65–99)

## 2017-06-26 LAB — MAGNESIUM: MAGNESIUM: 2 mg/dL (ref 1.7–2.4)

## 2017-06-26 LAB — PHOSPHORUS: Phosphorus: 4.6 mg/dL (ref 2.5–4.6)

## 2017-06-26 MED ORDER — FUROSEMIDE 40 MG PO TABS: 40.0000 mg | ORAL_TABLET | Freq: Two times a day (BID) | ORAL | Status: DC

## 2017-06-26 MED ORDER — ALLOPURINOL 100 MG PO TABS
100.0000 mg | ORAL_TABLET | Freq: Every day | ORAL | Status: DC
  Administered 2017-06-26 – 2017-07-07 (×11): 100 mg via ORAL
  Filled 2017-06-26 (×12): qty 1

## 2017-06-26 MED ORDER — POTASSIUM CHLORIDE CRYS ER 20 MEQ PO TBCR
20.0000 meq | EXTENDED_RELEASE_TABLET | Freq: Every day | ORAL | Status: DC
  Administered 2017-06-26 – 2017-06-29 (×4): 20 meq via ORAL
  Filled 2017-06-26 (×4): qty 1

## 2017-06-26 MED ORDER — FUROSEMIDE 40 MG PO TABS
40.0000 mg | ORAL_TABLET | Freq: Every day | ORAL | Status: DC
  Filled 2017-06-26: qty 1

## 2017-06-26 MED ORDER — AMITRIPTYLINE HCL 10 MG PO TABS
10.0000 mg | ORAL_TABLET | Freq: Every day | ORAL | Status: DC
  Administered 2017-06-26 – 2017-07-03 (×7): 10 mg via ORAL
  Filled 2017-06-26 (×10): qty 1

## 2017-06-26 MED ORDER — GABAPENTIN 100 MG PO CAPS: 100.0000 mg | ORAL_CAPSULE | Freq: Every day | ORAL | Status: DC | PRN

## 2017-06-26 MED ORDER — HEPARIN SODIUM (PORCINE) 5000 UNIT/ML IJ SOLN
5000.0000 [IU] | Freq: Three times a day (TID) | INTRAMUSCULAR | Status: DC
  Administered 2017-06-26 – 2017-07-07 (×32): 5000 [IU] via SUBCUTANEOUS
  Filled 2017-06-26 (×32): qty 1

## 2017-06-26 MED ORDER — ADULT MULTIVITAMIN W/MINERALS CH
1.0000 | ORAL_TABLET | Freq: Every day | ORAL | Status: DC
  Administered 2017-06-26 – 2017-06-29 (×4): 1 via ORAL
  Filled 2017-06-26 (×4): qty 1

## 2017-06-26 MED ORDER — GABAPENTIN 300 MG PO CAPS: 300.0000 mg | ORAL_CAPSULE | Freq: Every day | ORAL | Status: DC

## 2017-06-26 MED ORDER — AMLODIPINE BESYLATE 5 MG PO TABS
5.0000 mg | ORAL_TABLET | Freq: Every day | ORAL | Status: DC
  Administered 2017-06-26 – 2017-06-27 (×2): 5 mg via ORAL
  Filled 2017-06-26 (×2): qty 1

## 2017-06-26 MED ORDER — ACETAMINOPHEN 325 MG PO TABS
650.0000 mg | ORAL_TABLET | ORAL | Status: DC | PRN
  Administered 2017-06-26 – 2017-07-06 (×10): 650 mg via ORAL
  Filled 2017-06-26 (×10): qty 2

## 2017-06-26 MED ORDER — OMEGA-3-ACID ETHYL ESTERS 1 G PO CAPS
1.0000 g | ORAL_CAPSULE | Freq: Every day | ORAL | Status: DC
  Administered 2017-06-26 – 2017-07-07 (×12): 1 g via ORAL
  Filled 2017-06-26 (×13): qty 1

## 2017-06-26 MED ORDER — MORPHINE SULFATE (PF) 2 MG/ML IV SOLN
1.0000 mg | Freq: Once | INTRAVENOUS | Status: AC
  Administered 2017-06-26: 1 mg via INTRAVENOUS
  Filled 2017-06-26: qty 1

## 2017-06-26 NOTE — Progress Notes (Signed)
Pt was not relieved by tylenol PRN. MD was notified and he ordered Morphine 1mg  once and given. MD also ordered Venous Imaging on right lower leg. Will continue to monitor

## 2017-06-26 NOTE — Progress Notes (Addendum)
[facility] [place] at [facility]   PATIENT NAME: Christopher [name]    MR#:  [id number]  DATE OF BIRTH:  1940-08-29  SUBJECTIVE:  CHIEF COMPLAINT:   Chief Complaint  Patient presents with  . Shortness of Breath    Came with respi distress, had pulm edema, intubated in night and started on HD as have worsened renal func.   successfully extubated 06/24/17.  Have pain in right leg today.  REVIEW OF SYSTEMS:     Review of Systems  Constitutional: Negative for fever and malaise/fatigue.  HENT: Negative for congestion, ear discharge and tinnitus.   Eyes: Negative for blurred vision.  Respiratory: Negative for cough, shortness of breath and wheezing.   Cardiovascular: Positive for leg swelling. Negative for chest pain, palpitations and orthopnea.  Gastrointestinal: Negative for abdominal pain, diarrhea, nausea and vomiting.  Genitourinary: Negative for dysuria and urgency.  Skin: Negative for rash.  Neurological: Positive for weakness. Negative for tremors, sensory change and focal weakness.  Psychiatric/Behavioral: Negative for depression and memory loss.    DRUG ALLERGIES:   Allergies  Allergen Reactions  . Neurontin [Gabapentin] Other (See Comments)    Swelling when taking everyday    VITALS:  Blood pressure (!) 151/70, pulse 99, temperature 98.5 F (36.9 C), temperature source Oral, resp. rate 19, height  (1.727 m), weight 92.5 kg (204 lb), SpO2 (!) 89 %.  PHYSICAL EXAMINATION:  GENERAL:  76 y.o.-year-old patient lying in the bed with no acute distress.  EYES: Pupils equal, round, reactive to light and accommodation. No scleral icterus. Extraocular muscles intact.  HEENT: Head atraumatic, normocephalic. Oropharynx and nasopharynx clear. NECK:  Supple, no jugular venous distention. No thyroid enlargement, no tenderness.  LUNGS: Normal breath sounds bilaterally, no wheezing, rales,rhonchi or crepitation. No use of accessory muscles of respiration.   CARDIOVASCULAR: S1, S2 normal. No murmurs, rubs, or gallops.  ABDOMEN: Soft, nontender, nondistended. Bowel sounds present. No organomegaly or mass. Rectal tube and Foley in place. EXTREMITIES: b/l pedal edema,no cyanosis, or clubbing. Right groin HD cath in place. NEUROLOGIC: Cranial nerves intact, Moves all 4 limbs spontaneously and follows commands, generalized weakness .  PSYCHIATRIC: The patient is alert and oriented X3.  SKIN: No obvious rash, lesion, or ulcer.   Physical Exam LABORATORY PANEL:   CBC Recent Labs  Lab 06/26/17 0436  WBC 10.2  HGB 8.8*  HCT 26.6*  PLT 145*   ------------------------------------------------------------------------------------------------------------------  Chemistries  Recent Labs  Lab 06/21/17 0605  06/26/17 0436  NA 139   < > 140  K 5.9*   < > 3.7  CL 112*   < > 102  CO2 18*   < > 26  GLUCOSE 276*   < > 111*  BUN 150*   < > 75*  CREATININE 5.82*   < > 3.03*  CALCIUM 7.2*   < > 8.2*  MG 2.5*   < > 2.0  AST 14*  --   --   ALT 17  --   --   ALKPHOS 75  --   --   BILITOT 0.5  --   --    < > = values in this interval not displayed.   ------------------------------------------------------------------------------------------------------------------  Cardiac Enzymes Recent Labs  Lab 06/20/17 1445 06/21/17 0608  TROPONINI <0.03 0.03*   ------------------------------------------------------------------------------------------------------------------  RADIOLOGY:  Dg Chest Port 1 View  Result Date: 06/25/2017 CLINICAL DATA:  Respiratory failure EXAM: PORTABLE CHEST 1 VIEW COMPARISON:  Yesterday FINDINGS: Low volume chest with streaky opacities  that are stable from yesterday. Stable heart size and mediastinal contours. Status post CABG. Right IJ catheter with tip at the upper cavoatrial junction. No edema, effusion, or pneumothorax. Postoperative cervical spine and right shoulder. IMPRESSION: Lower volumes after extubation. Similar  appearing atelectasis at the bases. Electronically Signed   By: Marnee SpringJonathon  Watts M.D.   On: 06/25/2017 07:05    ASSESSMENT AND PLAN:   Active Problems:   Acute renal failure superimposed on stage 4 chronic kidney disease (HCC)   Acute respiratory failure (HCC)   Acute renal failure (HCC)  1.  Ac respi failure due to Ac diastolic CHF    Concern for aspiration     Fluid overload due to Renal failure: Acute on chronic renal failure stage 4.      Patient is fluid overloaded resulting in hypoxia.     Appreciated nephro help to initiate CRRT. On IV lasix.      Intubated on vent support.    Received Abx for 1 day, but stopped. Monitor.   nephro to decide continued need for HD.   Extubated 06/24/17.   On nasal canula oxygen now. Try to taper to room air.   Nephrology decided no further need for HD. Remove temp HD catheter.  2.  Diabetes mellitus type 2: Continue levemir per home regimen.  Gabapentin for neuropathic pain 3.  Coronary artery disease: Stable: Continue aspirin 4.  Hypertension: Controlled; Hold amlodipine and metoprolol. 5.  Gout: Controlled; hold allopurinol 6.  DVT prophylaxis: Heparin 7.  GI prophylaxis: None 8. Leg pain, get DVT study, cut down lasix dose.  All the records are reviewed and case discussed with Care Management/Social Workerr. Management plans discussed with the patient, family and they are in agreement.  CODE STATUS: Full.  TOTAL TIME TAKING CARE OF THIS PATIENT: 35 minutes.   Discussed with wife in room. Need PT eval for d.c planning.  POSSIBLE D/C IN 2-3 DAYS, DEPENDING ON CLINICAL CONDITION.   Altamese DillingVaibhavkumar Lilas Diefendorf M.D on 06/26/2017   Between 7am to 6pm - Pager - [phone]  After 6pm go to [email or web address] - password [email or web address]  [facility] [place] [facility]  Office  [phone]  CC: Primary care physician; System, Provider Not In  Note: This dictation was prepared with Dragon dictation along with smaller phrase technology. Any  transcriptional errors that result from this process are unintentional.

## 2017-06-26 NOTE — Care Management (Signed)
Patient transferred out of ICU to 2A. Anticipate that temporary dialysis catheter will be removed. Was seen by physical therapy 11/15 and skilled nursing facility placement was recommended. Patient and his wife are in agreement.  Patient current need for home oxygen is acute. [place] in [place] and was traveling home when he became short of breath.  If patient is able to discharge home, would also be agreeable to home health but at present skilled nursing facility will be the most appropriate disposition.  Updated CSW

## 2017-06-26 NOTE — [place] FL2 (Signed)
[place] MEDICAID FL2 LEVEL OF CARE SCREENING TOOL     IDENTIFICATION  Patient Name: Christopher [name] Birthdate: [birth year] Sex: male Admission Date (Current Location): 06/20/2017  [place]   Facility and Address:  [facility], [address], [place], [place] [address]      Provider Number: [id number]  Attending Physician Name and Address:  Altamese DillingVachhani, Vaibhavkumar, *  Relative Name and Phone Number:  Adrian ProwsMoore,[name]   [phone]     Current Level of Care: Hospital Recommended Level of Care: Skilled Nursing Facility Prior Approval Number:    Date Approved/Denied:   PASRR Number: [id number] [id number]  Discharge Plan: SNF    Current Diagnoses: Patient Active Problem List   Diagnosis Date Noted  . Acute respiratory failure (HCC)   . Acute renal failure (HCC)   . Acute renal failure superimposed on stage 4 chronic kidney disease (HCC) 06/20/2017  . Myelopathy (HCC) 08/01/2011    Orientation RESPIRATION BLADDER Height & Weight     Self, Time, Situation, Place  O2(2.5 L) Continent Weight: 204 lb (92.5 kg) Height:  5\' 8"  (172.7 cm)  BEHAVIORAL SYMPTOMS/MOOD NEUROLOGICAL BOWEL NUTRITION STATUS      Continent Diet(Dysphagia 3 diet)  AMBULATORY STATUS COMMUNICATION OF NEEDS Skin   Limited Assist Verbally Normal                       Personal Care Assistance Level of Assistance  Bathing, Feeding, Dressing Bathing Assistance: Limited assistance Feeding assistance: Independent Dressing Assistance: Limited assistance     Functional Limitations Info  Sight, Hearing, Speech Sight Info: Adequate Hearing Info: Adequate Speech Info: Adequate    SPECIAL CARE FACTORS FREQUENCY  PT (By licensed PT), OT (By licensed OT)     PT Frequency: 5x a week OT Frequency: 5x a week            Contractures Contractures Info: Not present    Additional Factors Info  Code Status, Allergies, Insulin Sliding Scale Code  Status Info: Full Code Allergies Info: NEURONTIN GABAPENTIN    Insulin Sliding Scale Info: insulin aspart (novoLOG) injection 0-15 Units        Current Medications (06/26/2017):  This is the current hospital active medication list Current Facility-Administered Medications  Medication Dose Route Frequency Provider Last Rate Last Dose  . acetaminophen (TYLENOL) tablet 650 mg  650 mg Oral Q4H PRN Altamese DillingVachhani, Vaibhavkumar, MD   650 mg at 06/26/17 1407  . allopurinol (ZYLOPRIM) tablet 100 mg  100 mg Oral Daily Altamese DillingVachhani, Vaibhavkumar, MD   100 mg at 06/26/17 1611  . amitriptyline (ELAVIL) tablet 10 mg  10 mg Oral QHS Altamese DillingVachhani, Vaibhavkumar, MD      . amLODipine (NORVASC) tablet 5 mg  5 mg Oral Daily Altamese DillingVachhani, Vaibhavkumar, MD   5 mg at 06/26/17 1226  . aspirin chewable tablet 81 mg  81 mg Per Tube Daily Merwyn KatosSimonds, David B, MD   81 mg at 06/26/17 [phone]  . furosemide (LASIX) tablet 40 mg  40 mg Oral BID Mosetta PigeonSingh, Harmeet, MD      . Melene Muller[START ON 06/27/2017] gabapentin (NEURONTIN) capsule 100 mg  100 mg Oral Daily PRN Altamese DillingVachhani, Vaibhavkumar, MD      . gabapentin (NEURONTIN) capsule 300 mg  300 mg Oral Daily Altamese DillingVachhani, Vaibhavkumar, MD      . heparin injection 5,000 Units  5,000 Units Subcutaneous Q8H Altamese DillingVachhani, Vaibhavkumar, MD   5,000 Units at 06/26/17 1612  . insulin aspart (novoLOG) injection  0-15 Units  0-15 Units Subcutaneous TID WC Merwyn KatosSimonds, David B, MD   2 Units at 06/26/17 1227  . insulin aspart (novoLOG) injection 0-5 Units  0-5 Units Subcutaneous QHS Merwyn KatosSimonds, David B, MD      . insulin detemir (LEVEMIR) injection 10 Units  10 Units Subcutaneous BID AC & HS Altamese DillingVachhani, Vaibhavkumar, MD   10 Units at 06/26/17 [phone]  . ipratropium-albuterol (DUONEB) 0.5-2.5 (3) MG/3ML nebulizer solution 3 mL  3 mL Nebulization Q4H PRN Merwyn KatosSimonds, David B, MD      . MEDLINE mouth rinse  15 mL Mouth Rinse q12n4p Merwyn KatosSimonds, David B, MD   15 mL at 06/26/17 1643  . metoprolol tartrate (LOPRESSOR) tablet 25 mg  25 mg Oral BID Merwyn KatosSimonds,  David B, MD   25 mg at 06/26/17 [phone]  . multivitamin with minerals tablet 1 tablet  1 tablet Oral Daily Altamese DillingVachhani, Vaibhavkumar, MD   1 tablet at 06/26/17 1611  . omega-3 acid ethyl esters (LOVAZA) capsule 1 g  1 g Oral Daily Altamese DillingVachhani, Vaibhavkumar, MD   1 g at 06/26/17 1611  . ondansetron (ZOFRAN) injection 4 mg  4 mg Intravenous Q6H PRN Arnaldo Nataliamond, Michael S, MD      . potassium chloride SA (K-DUR,KLOR-CON) CR tablet 20 mEq  20 mEq Oral Daily Altamese DillingVachhani, Vaibhavkumar, MD   20 mEq at 06/26/17 1611  . protein supplement (PREMIER PROTEIN) liquid  11 oz Oral BID BM Merwyn KatosSimonds, David B, MD   11 oz at 06/26/17 1024  . temazepam (RESTORIL) capsule 7.5 mg  7.5 mg Oral QHS PRN Oralia ManisWillis, David, MD   7.5 mg at 06/25/17 2246     Discharge Medications: Please see discharge summary for a list of discharge medications.  Relevant Imaging Results:  Relevant Lab Results:   Additional Information SSN [id number]  Darleene Cleavernterhaus, Arletta Lumadue R, [place]

## 2017-06-26 NOTE — Clinical Social Work Note (Deleted)
Clinical Social Work Assessment  Patient Details  Name: Christopher [name] MRN: [id number] Date of Birth: April 06, 1941  Date of referral:  06/26/17               Reason for consult:  Facility Placement                Permission sought to share information with:  Facility Medical sales representativeContact Representative, Family Supports Permission granted to share information::  Yes, Verbal Permission Granted  Name::     Fredia BeetsMoore,[name]   [phone]   Agency::  SNF admissions  Relationship::     Contact Information:     Housing/Transportation Living arrangements for the past 2 months:  Skilled Nursing Facility Source of Information:  Patient, Spouse Patient Interpreter Needed:  None Criminal Activity/Legal Involvement Pertinent to Current Situation/Hospitalization:  No - Comment as needed Significant Relationships:  Adult Children, Spouse Lives with:  Spouse Do you feel safe going back to the place where you live?  No Need for family participation in patient care:  No (Coment)  Care giving concerns:  Patient and wife feel patient may need short term rehab before returning back home.   Social Worker assessment / plan:  Patient is a 76 year old male who is alert and oriented x4.  Patient is married, his wife is at bedside, patient has been to rehab several years ago when patient was living in [place].  Patient and his wife states that they have only been living in the area since June, they moved her to be closer to their children.  Patient states he lives in [place], and has not been to rehab here.  CSW explained role of CSW and process for looking for placement.  CSW explained how insurance will pay for stay at SNF and what to expect.  CSW discussed with patient and his wife, the list of SNFs that is available to them.  Patient and his wife did not express any other questions or concerns and gave CSW permission to begin [place] search [place].  Employment status:  Retired Product/process development scientistnsurance information:  Managed  Medicare PT Recommendations:  Skilled Nursing Facility Information / Referral to community resources:  Skilled Nursing Facility  Patient/Family's Response to care:  Patient and family are in agreement to going to SNF for short term rehab.  Patient/Family's Understanding of and Emotional Response to Diagnosis, Current Treatment, and Prognosis:  Patient is hopeful that he will not have to be in rehab for very long, and also that the physicians can figure out what is going on with him.  Emotional Assessment Appearance:  Appears stated age Attitude/Demeanor/Rapport:    Affect (typically observed):  Appropriate, Calm, Pleasant Orientation:  Oriented to Self, Oriented to Place, Oriented to  Time, Oriented to Situation Alcohol / Substance use:  Not Applicable Psych involvement (Current and /or in the community):  No (Comment)  Discharge Needs  Concerns to be addressed:  Lack of Support Readmission within the last 30 days:  No Current discharge risk:  Lack of support system Barriers to Discharge:  Continued Medical Work up, [employer]urance Authorization   Darleene Cleavernterhaus, Javionna Leder R, LCSWA 06/26/2017, 6:09 PM

## 2017-06-26 NOTE — Progress Notes (Signed)
Pt states that he had an allergy to Gabapentin. MD was notified and give verbal order to d/c Gabapentin.

## 2017-06-26 NOTE — Progress Notes (Signed)
[facility] [place], [place] 06/26/17  Subjective:   patient is doing fair overall. Urine output greater than 3000 cc last 24 hours.  Serum creatinine further increased To 3.03.  Patient states he is able to eat  a little better.  No nausea or vomiting reported    Continues to have edema  Objective:  Vital signs in last 24 hours:  Temp:  [98.4 F (36.9 C)-100 F (37.8 C)] 98.8 F (37.1 C) (11/16 0456) Pulse Rate:  [94-107] 103 (11/16 0927) Resp:  [17-26] 19 (11/16 0456) BP: (140-174)/(70-92) 153/81 (11/16 0927) SpO2:  [91 %-96 %] 93 % (11/16 0927) Weight:  [92.5 kg (204 lb)] 92.5 kg (204 lb) (11/16 0500)  Weight change: -8.566 kg (-14.2 oz) Filed Weights   06/24/17 0500 06/25/17 0427 06/26/17 0500  Weight: 100.7 kg (222 lb 0.1 oz) 101.1 kg (222 lb 14.2 oz) 92.5 kg (204 lb)    Intake/Output:    Intake/Output Summary (Last 24 hours) at 06/26/2017 1026 Last data filed at 06/26/2017 1016 Gross per 24 hour  Intake 360 ml  Output 3140 ml  Net -2780 ml     Physical Exam: General:  Chronically ill-appearing  HEENT  nasal cannula oxygen   Neck  no masses  Pulm/lungs  coarse breath sounds bilaterally  CVS/Heart  no rub or gallop  Abdomen:   Soft  Extremities: ++ Significant dependent edema  Neurologic:  patient is alert and is able to follow simple commands     Access:  Temporary dialysis catheter; Rt femoral       Basic Metabolic Panel:  Recent Labs  Lab 06/23/17 2107 06/23/17 2354 06/24/17 0430 06/24/17 0432 06/24/17 0756 06/24/17 1501 06/25/17 0441 06/26/17 0436  NA 139 138 139 139 138 141 142 140  K 4.3 4.1 3.9 3.9 3.7 3.9 4.0 3.7  CL 105 105 106 106 103 103 104 102  CO2 23 23 21* 25 24 24 26 26   GLUCOSE 173* 196* 192* 195* 187* 132* 115* 111*  BUN 65* 66* 73* 73* 75* 76* 72* 75*  CREATININE 2.34* 2.45* 2.61* 2.45* 2.65* 2.82* 2.91* 3.03*  CALCIUM 7.8* 7.9* 7.7* 7.7* 7.8* 8.0* 8.1* 8.2*  MG 2.0 2.0  --  2.0 2.0  --   --  2.0   PHOS 4.6 4.9* 5.1*  --  5.3*  --   --  4.6     CBC: Recent Labs  Lab 06/20/17 1445  06/21/17 0700 06/23/17 0424 06/24/17 0432 06/25/17 0441 06/26/17 0436  WBC 7.1   < > 7.2 7.8 8.6 9.8 10.2  NEUTROABS 5.2  --   --  5.2  --   --   --   HGB 10.4*   < > 9.5* 8.5* 8.2* 8.7* 8.8*  HCT 32.9*   < > 29.8* 26.6* 25.2* 27.0* 26.6*  MCV 99.2   < > 98.6 97.0 96.5 96.2 98.8  PLT 164   < > 150 93* 48* 116* 145*   < > = values in this interval not displayed.     No results found for: HEPBSAG, HEPBSAB, HEPBIGM    Microbiology:  Recent Results (from the past 240 hour(s))  MRSA PCR Screening     Status: None   Collection Time: 06/21/17  5:00 AM  Result Value Ref Range Status   MRSA by PCR NEGATIVE NEGATIVE Final    Comment:        The GeneXpert MRSA Assay (FDA approved for NASAL specimens only), is one component of a  comprehensive MRSA colonization surveillance program. It is not intended to diagnose MRSA infection nor to guide or monitor treatment for MRSA infections.   Culture, blood (Routine X 2) w Reflex to ID Panel     Status: None   Collection Time: 06/21/17  8:00 AM  Result Value Ref Range Status   Specimen Description BLOOD RIGHT ANTECUBITAL  Final   Special Requests   Final    BOTTLES DRAWN AEROBIC AND ANAEROBIC Blood Culture results may not be optimal due to an excessive volume of blood received in culture bottles   Culture NO GROWTH 5 DAYS  Final   Report Status 06/26/2017 FINAL  Final  Culture, blood (Routine X 2) w Reflex to ID Panel     Status: None   Collection Time: 06/21/17  8:04 AM  Result Value Ref Range Status   Specimen Description BLOOD BLOOD RIGHT FOREARM  Final   Special Requests   Final    BOTTLES DRAWN AEROBIC AND ANAEROBIC Blood Culture results may not be optimal due to an excessive volume of blood received in culture bottles   Culture NO GROWTH 5 DAYS  Final   Report Status 06/26/2017 FINAL  Final    Coagulation Studies: No results for  input(s): LABPROT, INR in the last 72 hours.  Urinalysis: No results for input(s): COLORURINE, LABSPEC, PHURINE, GLUCOSEU, HGBUR, BILIRUBINUR, KETONESUR, PROTEINUR, UROBILINOGEN, NITRITE, LEUKOCYTESUR in the last 72 hours.  Invalid input(s): APPERANCEUR    Imaging: Dg Chest Port 1 View  Result Date: 06/25/2017 CLINICAL DATA:  Respiratory failure EXAM: PORTABLE CHEST 1 VIEW COMPARISON:  Yesterday FINDINGS: Low volume chest with streaky opacities that are stable from yesterday. Stable heart size and mediastinal contours. Status post CABG. Right IJ catheter with tip at the upper cavoatrial junction. No edema, effusion, or pneumothorax. Postoperative cervical spine and right shoulder. IMPRESSION: Lower volumes after extubation. Similar appearing atelectasis at the bases. Electronically Signed   By: Marnee SpringJonathon  Watts M.D.   On: 06/25/2017 07:05     Medications:    . aspirin  81 mg Per Tube Daily  . furosemide  40 mg Intravenous Q12H  . insulin aspart  0-15 Units Subcutaneous TID WC  . insulin aspart  0-5 Units Subcutaneous QHS  . insulin detemir  10 Units Subcutaneous BID AC & HS  . mouth rinse  15 mL Mouth Rinse q12n4p  . metoprolol tartrate  25 mg Oral BID  . protein supplement shake  11 oz Oral BID BM   acetaminophen, ipratropium-albuterol, [DISCONTINUED] ondansetron **OR** ondansetron (ZOFRAN) IV, temazepam  Assessment/ Plan:  76 y.o. caucasian male with chronic kidney disease stage IV baseline creatinine 2.5-3, coronary artery disease, hypertension, hyperlipidemia, left partial nephrectomy, gout, diabetes mellitus type II insulin dependent, diabetic peripheral neuropathy and osteoarthritis, who [facility]2017 for SOB (shortness of breath) [R06.02] Hypoxia [R09.02] Acute renal failure, unspecified acute renal failure type (HCC) [N17  1.  Acute renal failure 2.  Hyperkalemia 3.  Chronic kidney stage IV 4.  Diabetes type 2 with chronic kidney disease 5.  Acute  respiratory failure, requiring ventilator support 6.  Generalized edema  Urine output has significantly improved.  3000 over the last 24 hours.  Serum creatinine slightly higher at 3.03 Still has significant volume overload.  Currently getting Lasix IV 40 mg twice a day- Change to Lasix 40 mg orally BID Continue to monitor electrolytes and serum creatinine May remove the dialysis catheter    LOS: 6 Gaige Sebo 11/16/201810:26 AM  [place], [place]

## 2017-06-26 NOTE — Progress Notes (Addendum)
Physical Therapy Treatment Patient Details Name: Maralyn SagoRalph T Winiecki MRN: [id number] DOB: 06/12/1941 Today's Date: 06/26/2017    History of Present Illness Pt is a 76 y.o. male presenting to hospital 06/20/17 with SOB (pt with significant SOB coming back from fishing trip; O2 noted to be 67% and EMS initiated CPAP).  Pt admitted to hospital with acute on chronic kidney disease (fluid overloaded resulting in hypoxia).  Pt transferred to CCU 06/21/17 secondary to unresponsiveness and respiratory distress.  Pt diagnosed with acute hypoxic/hypercarbic respiratory failure, acute pulmonary edema, acute CHF exacerbation, and acute on chronic renal failure.  Pt intubated 06/21/17 and extubated 06/24/17.  CRRT initiated 06/21/17 and discontinued 06/23/17 (pt now receiving HD via temporary R HD fem cath).  PMH includes anxiety, CKD, htn, MI, anginal pain, chronic back pain, DM, anterior fusion c-spine, B RCR, and L TKA.    PT Comments    Spoke with RN prior to treatment and reported he continues to have a R femoral cath, and to continue with hospital precautions regarding getting OOB. Nurse stated he has orders for it to be discontinued but she hasn't had time to remove it. Continued focus on bil LE mobility and strengthening in all planes to prevent muscle tightness and deconditioning while in bed. Pt reported 7/10 pain in the R ankle with report of improvement following exercises today. Nurse in room at end or session and was  notified of pt progress and pain response/location.   Follow Up Recommendations        Equipment Recommendations  Rolling walker with 5" wheels    Recommendations for Other Services       Precautions / Restrictions Restrictions Weight Bearing Restrictions: No    Mobility  Bed Mobility Overal bed mobility: (unable to assess )                Transfers Overall transfer level: (unable to assess due to precuations of fem cath)                  Ambulation/Gait                  Stairs            Wheelchair Mobility    Modified Rankin (Stroke Patients Only)       Balance                                            Cognition Arousal/Alertness: Awake/alert Behavior During Therapy: WFL for tasks assessed/performed                                          Exercises Total Joint Exercises Ankle Circles/Pumps: AROM;Strengthening;Both;Supine Quad Sets: AROM;Strengthening;Both;Supine Short Arc Quad: AROM;Strengthening;Left;Supine Heel Slides: AAROM;Strengthening;Left;10 reps;Supine Hip ABduction/ADduction: AAROM;AROM;Strengthening;Left;10 reps;Supine Other Exercises Other Exercises: glute set 2 x 10 Other Exercises: resisted ankle PF/DF bil 2 x 10    General Comments        Pertinent Vitals/Pain Pain Assessment: 0-10 Pain Score: 7  Pain Location: R ankle (unable to note specific location) Pain Descriptors / Indicators: Aching;Sore;Tightness    Home Living                      Prior Function  PT Goals (current goals can now be found in the care plan section) Acute Rehab PT Goals Patient Stated Goal: to be able to walk PT Goal Formulation: With patient/family Progress towards PT goals: Progressing toward goals    Frequency    Min 2X/week      PT Plan      Co-evaluation              AM-PAC PT "6 Clicks" Daily Activity  Outcome Measure  Difficulty turning over in bed (including adjusting bedclothes, sheets and blankets)?: Unable Difficulty moving from lying on back to sitting on the side of the bed? : Unable Difficulty sitting down on and standing up from a chair with arms (e.g., wheelchair, bedside commode, etc,.)?: Unable Help needed moving to and from a bed to chair (including a wheelchair)?: A Lot Help needed walking in hospital room?: Total Help needed climbing 3-5 steps with a railing? : Total 6 Click Score: 7    End of Session Equipment  Utilized During Treatment: Oxygen Activity Tolerance: Patient tolerated treatment well Patient left: in bed;with call bell/phone within reach;with bed alarm set;with family/visitor present Nurse Communication: Mobility status;Precautions;Other (comment) PT Visit Diagnosis: Other abnormalities of gait and mobility (R26.89);History of falling (Z91.81);Muscle weakness (generalized) (M62.81)     Time: [id number] PT Time Calculation (min) (ACUTE ONLY): 40 min  Charges:  $Therapeutic Exercise: 38-52 mins                    G Codes:       Sirius Woodford PT, DPT, LAT, ATC  06/26/17  5:20 PM        Anna-Marie Coller 06/26/2017, 5:20 PM

## 2017-06-26 NOTE — Progress Notes (Signed)
Pt was complaining of right leg pain and was swollen than on the left leg. MD notified and ordered tylenol every 4 hours. Will continue to monitor.

## 2017-06-26 NOTE — Progress Notes (Signed)
Pharmacy Electrolyte Monitoring Consult  Pharmacy consulted to assist in monitoring and replacing electrolytes in this 76 y.o.  male admitted with Shortness of Breath Patient is no longer on CRRT. Patient is still receiving lasix 40mg  Q12H.   Labs: Sodium (mmol/L)  Date Value  06/26/2017 140   Potassium (mmol/L)  Date Value  06/26/2017 3.7   Magnesium (mg/dL)  Date Value  40/98/119111/16/2018 2.0   Phosphorus (mg/dL)  Date Value  47/82/956211/16/2018 4.6   Calcium (mg/dL)  Date Value  13/08/657811/16/2018 8.2 (L)   Albumin (g/dL)  Date Value  46/96/295211/14/2018 2.5 (L)    Plan:  Labs within normal limits. No replacement indicated. Recheck BMP as well as Mg and Phos with AM labs.  06/26/2017 0436 K 3.7, Phos 4.6, Mg 2. No replacement indicated. Will recheck electrolytes in 2 days.  Leighana Neyman A. [place], [place]arm.D., BCPS Clinical Pharmacist 06/26/17 7:13 AM

## 2017-06-26 NOTE — Care Management Important Message (Signed)
Important Message  Patient Details  Name: Maralyn SagoRalph T Kley MRN: [id number] Date of Birth: 1940-10-21   Medicare Important Message Given:  Yes Signed IM notice given   Eber HongGreene, Daviel Allegretto R, RN 06/26/2017, 4:35 PM

## 2017-06-27 ENCOUNTER — Inpatient Hospital Stay: Payer: Medicare HMO

## 2017-06-27 LAB — CBC
HEMATOCRIT: 29.8 % — AB (ref 40.0–52.0)
Hemoglobin: 9.7 g/dL — ABNORMAL LOW (ref 13.0–18.0)
MCH: 31.6 pg (ref 26.0–34.0)
MCHC: 32.7 g/dL (ref 32.0–36.0)
MCV: 96.7 fL (ref 80.0–100.0)
Platelets: 185 10*3/uL (ref 150–440)
RBC: 3.08 MIL/uL — ABNORMAL LOW (ref 4.40–5.90)
RDW: 14.2 % (ref 11.5–14.5)
WBC: 10.8 10*3/uL — AB (ref 3.8–10.6)

## 2017-06-27 LAB — BASIC METABOLIC PANEL
ANION GAP: 15 (ref 5–15)
BUN: 80 mg/dL — ABNORMAL HIGH (ref 6–20)
CALCIUM: 8.4 mg/dL — AB (ref 8.9–10.3)
CHLORIDE: 100 mmol/L — AB (ref 101–111)
CO2: 25 mmol/L (ref 22–32)
Creatinine, Ser: 2.94 mg/dL — ABNORMAL HIGH (ref 0.61–1.24)
GFR calc Af Amer: 23 mL/min — ABNORMAL LOW (ref >60)
GFR calc non Af Amer: 20 mL/min — ABNORMAL LOW (ref >60)
Glucose, Bld: 123 mg/dL — ABNORMAL HIGH (ref 65–99)
Potassium: 3.9 mmol/L (ref 3.5–5.1)
Sodium: 140 mmol/L (ref 135–145)

## 2017-06-27 LAB — GLUCOSE, CAPILLARY
Glucose-Capillary: 128 mg/dL — ABNORMAL HIGH (ref 65–99)
Glucose-Capillary: 134 mg/dL — ABNORMAL HIGH (ref 65–99)
Glucose-Capillary: 139 mg/dL — ABNORMAL HIGH (ref 65–99)
Glucose-Capillary: 143 mg/dL — ABNORMAL HIGH (ref 65–99)

## 2017-06-27 MED ORDER — AMLODIPINE BESYLATE 10 MG PO TABS
10.0000 mg | ORAL_TABLET | Freq: Every day | ORAL | Status: DC
  Administered 2017-06-28 – 2017-07-02 (×5): 10 mg via ORAL
  Filled 2017-06-27 (×6): qty 1

## 2017-06-27 MED ORDER — FUROSEMIDE 40 MG PO TABS
80.0000 mg | ORAL_TABLET | Freq: Every day | ORAL | Status: DC
  Administered 2017-06-27: 80 mg via ORAL
  Filled 2017-06-27: qty 2

## 2017-06-27 MED ORDER — SODIUM CHLORIDE 0.9% FLUSH
3.0000 mL | Freq: Two times a day (BID) | INTRAVENOUS | Status: DC
  Administered 2017-06-28 – 2017-07-07 (×15): 3 mL via INTRAVENOUS

## 2017-06-27 MED ORDER — FUROSEMIDE 40 MG PO TABS
40.0000 mg | ORAL_TABLET | Freq: Two times a day (BID) | ORAL | Status: DC
Start: 2017-06-27 — End: 2017-07-07
  Administered 2017-06-27 – 2017-07-07 (×16): 40 mg via ORAL
  Filled 2017-06-27 (×16): qty 1

## 2017-06-27 MED ORDER — METOPROLOL TARTRATE 50 MG PO TABS
50.0000 mg | ORAL_TABLET | Freq: Two times a day (BID) | ORAL | Status: DC
  Administered 2017-06-27 – 2017-07-02 (×10): 50 mg via ORAL
  Filled 2017-06-27 (×10): qty 1

## 2017-06-27 NOTE — Plan of Care (Signed)
Encourage nutritional supplement between meals. Request family to bring food that patient enjoys.  Perform foot exercises, to prevent foot drop.

## 2017-06-27 NOTE — Progress Notes (Signed)
Clinical Social Worker (CSW) met with patient and his wife Arbie Cookey and son Carry were at bedside. CSW presented SNF bed offers. Wife and son reported that they would like a SNF closer to where they live in [place] or [place]. Wife reported that they live in [place] and patient was on the way back from a fishing trip when he got sick and came to [facility]. CSW extended bed search to [facility] and [facility]. CSW explained that EMS transportation may be a barrier. Wife reported that she is hoping patient will progress enough to get home with home health. CSW will continue to follow and assist as needed.   [employer], LCSW [phone]

## 2017-06-27 NOTE — Evaluation (Signed)
Occupational Therapy Evaluation Patient Details Name: Christopher [name] MRN: [id number] DOB: Sep 03, 1940 Today's Date: 06/27/2017    History of Present Illness Pt is a 76 y.o. male presenting to hospital 06/20/17 with SOB (pt with significant SOB coming back from fishing trip; O2 noted to be 67% and EMS initiated CPAP).  Pt admitted to hospital with acute on chronic kidney disease (fluid overloaded resulting in hypoxia).  Pt transferred to CCU 06/21/17 secondary to unresponsiveness and respiratory distress.  Pt diagnosed with acute hypoxic/hypercarbic respiratory failure, acute pulmonary edema, acute CHF exacerbation, and acute on chronic renal failure.  Pt intubated 06/21/17 and extubated 06/24/17.  CRRT initiated 06/21/17 and discontinued 06/23/17 (pt now receiving HD via temporary R HD fem cath).  PMH includes anxiety, CKD, htn, MI, anginal pain, chronic back pain, DM, anterior fusion c-spine, B RCR, and L TKA.   Clinical Impression   Patient seen for OT evaluation this date, son and wife present during evaluation.  Patient appears slightly confused at times this date and slow to respond to questions and with information inaccurate according to wife.  Patient was previously independent with basic self care tasks and she assisted with homemaking and cooking.  Patient now presents with muscle weakness, decreased ability to perform transfers, functional mobility and decreased ability to perform self care tasks.  He is now on oxygen 2.5L.  Patient requires +2 assist for transfers.  He would benefit from skilled OT to maximize his safety and independence in daily tasks.  He will likely require STR prior to returning home given the amount of assistance he is requiring currently.    Follow Up Recommendations  SNF    Equipment Recommendations       Recommendations for Other Services       Precautions / Restrictions Precautions Precautions: Fall Restrictions Weight Bearing Restrictions: No       Mobility Bed Mobility Overal bed mobility: Needs Assistance             General bed mobility comments: unable to get to edge of bed with one person, will need +2 assist  Transfers                      Balance                                           ADL either performed or assessed with clinical judgement   ADL Overall ADL's : Independent;Needs assistance/impaired Eating/Feeding: Minimal assistance   Grooming: Moderate assistance   Upper Body Bathing: Moderate assistance   Lower Body Bathing: Maximal assistance   Upper Body Dressing : Moderate assistance   Lower Body Dressing: Maximal assistance                 General ADL Comments: Patient unable to transfer to EOB this date with one person, will need +2 assist.     Vision Baseline Vision/History: No visual deficits       Perception     Praxis      Pertinent Vitals/Pain Pain Assessment: No/denies pain Pain Score: 0-No pain     Hand Dominance Right   Extremity/Trunk Assessment Upper Extremity Assessment Upper Extremity Assessment: RUE deficits/detail;LUE deficits/detail RUE Deficits / Details: Prior RTC injury with surgery, able to perform shoulder flexion to 90 degrees. LUE Deficits / Details: prior RTC injury with surgery, unable to raise left arm  this date, able to move wrist, hand and digits   Lower Extremity Assessment Lower Extremity Assessment: Defer to PT evaluation       Communication Communication Communication: No difficulties   Cognition Arousal/Alertness: Awake/alert Behavior During Therapy: WFL for tasks assessed/performed                                   General Comments: slow to respond to questions and appears confused at times   General Comments       Exercises     Shoulder Instructions      Home Living Family/patient expects to be discharged to:: Private residence Living Arrangements: Spouse/significant  other Available Help at Discharge: Family Type of Home: House Home Access: Stairs to enter   Entrance Stairs-Rails: Right Home Layout: One level     Bathroom Shower/Tub: Walk-in shower;Door   Bathroom Toilet: Handicapped height Bathroom Accessibility: Yes   Home Equipment: Environmental consultantWalker - 2 wheels;Cane - single point          Prior Functioning/Environment Level of Independence: Independent with assistive device(s)        Comments: Ambulates with SPC.  Active fisherman.  Recent fall a few days prior to admission (tripped over speed bump when walking).        OT Problem List: Decreased strength;Impaired balance (sitting and/or standing);Decreased range of motion;Decreased activity tolerance;Decreased knowledge of use of DME or AE;Impaired UE functional use;Decreased cognition      OT Treatment/Interventions: Self-care/ADL training;DME and/or AE instruction;Therapeutic activities;Balance training;Therapeutic exercise;Neuromuscular education;Patient/family education;Cognitive remediation/compensation    OT Goals(Current goals can be found in the care plan section) Acute Rehab OT Goals Patient Stated Goal: to do as much as possible, fish again OT Goal Formulation: With patient/family Time For Goal Achievement: 07/10/17 Potential to Achieve Goals: Good ADL Goals Pt Will Perform Lower Body Dressing: with mod assist Pt Will Transfer to Toilet: with mod assist  OT Frequency: Min 1X/week   Barriers to D/C:            Co-evaluation              AM-PAC PT "6 Clicks" Daily Activity     Outcome Measure Help from another person eating meals?: A Little Help from another person taking care of personal grooming?: A Lot Help from another person toileting, which includes using toliet, bedpan, or urinal?: Total Help from another person bathing (including washing, rinsing, drying)?: A Lot Help from another person to put on and taking off regular upper body clothing?: A Lot Help from  another person to put on and taking off regular lower body clothing?: Total 6 Click Score: 11   End of Session Equipment Utilized During Treatment: Oxygen  Activity Tolerance: Patient limited by lethargy Patient left: in bed;with call bell/phone within reach;with bed alarm set;with family/visitor present  OT Visit Diagnosis: History of falling (Z91.81);Muscle weakness (generalized) (M62.81)                Time: 1040-1102 OT Time Calculation (min): 22 min Charges:  OT General Charges $OT Visit: 1 Visit OT Evaluation $OT Eval Low Complexity: 1 Low G-Codes: OT G-codes **NOT FOR INPATIENT CLASS** Functional Assessment Tool Used: AM-PAC 6 Clicks Daily Activity Functional Limitation: Self care Self Care Current Status (Y7829(G8987): At least 80 percent but less than 100 percent impaired, limited or restricted Self Care Goal Status (F6213(G8988): At least 40 percent but less than 60 percent impaired, limited or  restricted   Dvaughn Fickle T Margarite Vessel, OTR/L, CLT   Jerricka Carvey 06/27/2017, 11:17 AM

## 2017-06-27 NOTE — Progress Notes (Signed)
[facility] [place], [place] 06/27/17  Subjective:   Patient is doing okay.  Urine output 2200 cc.  Lasix was switched to oral form yesterday. Complains of some pain over his right shin.  Improves with Tylenol Temporary dialysis catheter has been removed  Objective:  Vital signs in last 24 hours:  Temp:  [98.2 F (36.8 C)-98.7 F (37.1 C)] 98.7 F (37.1 C) (11/17 0813) Pulse Rate:  [86-101] 101 (11/17 0813) Resp:  [18-19] 18 (11/17 0450) BP: (139-164)/(70-82) 156/81 (11/17 0813) SpO2:  [89 %-94 %] 92 % (11/17 0813) Weight:  [91.1 kg (200 lb 12.8 oz)] 91.1 kg (200 lb 12.8 oz) (11/17 0500)  Weight change: -1.452 kg (-3.2 oz) Filed Weights   06/25/17 0427 06/26/17 0500 06/27/17 0500  Weight: 101.1 kg (222 lb 14.2 oz) 92.5 kg (204 lb) 91.1 kg (200 lb 12.8 oz)    Intake/Output:    Intake/Output Summary (Last 24 hours) at 06/27/2017 1059 Last data filed at 06/27/2017 [id number] Gross per 24 hour  Intake 480 ml  Output 2375 ml  Net -1895 ml     Physical Exam: General:  Chronically ill-appearing  HEENT  nasal cannula oxygen   Neck  no masses  Pulm/lungs  coarse breath sounds bilaterally  CVS/Heart  no rub or gallop  Abdomen:   Soft  Extremities: + dependent edema  Neurologic:  patient is alert and is able to follow simple commands             Basic Metabolic Panel:  Recent Labs  Lab 06/23/17 2107 06/23/17 2354 06/24/17 0430 06/24/17 0432 06/24/17 0756 06/24/17 1501 06/25/17 0441 06/26/17 0436 06/27/17 0546  NA 139 138 139 139 138 141 142 140 140  K 4.3 4.1 3.9 3.9 3.7 3.9 4.0 3.7 3.9  CL 105 105 106 106 103 103 104 102 100*  CO2 23 23 21* 25 24 24 26 26 25   GLUCOSE 173* 196* 192* 195* 187* 132* 115* 111* 123*  BUN 65* 66* 73* 73* 75* 76* 72* 75* 80*  CREATININE 2.34* 2.45* 2.61* 2.45* 2.65* 2.82* 2.91* 3.03* 2.94*  CALCIUM 7.8* 7.9* 7.7* 7.7* 7.8* 8.0* 8.1* 8.2* 8.4*  MG 2.0 2.0  --  2.0 2.0  --   --  2.0  --   PHOS 4.6 4.9* 5.1*  --   5.3*  --   --  4.6  --      CBC: Recent Labs  Lab 06/20/17 1445  06/23/17 0424 06/24/17 0432 06/25/17 0441 06/26/17 0436 06/27/17 0546  WBC 7.1   < > 7.8 8.6 9.8 10.2 10.8*  NEUTROABS 5.2  --  5.2  --   --   --   --   HGB 10.4*   < > 8.5* 8.2* 8.7* 8.8* 9.7*  HCT 32.9*   < > 26.6* 25.2* 27.0* 26.6* 29.8*  MCV 99.2   < > 97.0 96.5 96.2 98.8 96.7  PLT 164   < > 93* 48* 116* 145* 185   < > = values in this interval not displayed.     No results found for: HEPBSAG, HEPBSAB, HEPBIGM    Microbiology:  Recent Results (from the past 240 hour(s))  MRSA PCR Screening     Status: None   Collection Time: 06/21/17  5:00 AM  Result Value Ref Range Status   MRSA by PCR NEGATIVE NEGATIVE Final    Comment:        The GeneXpert MRSA Assay (FDA approved for NASAL specimens only), is one  component of a comprehensive MRSA colonization surveillance program. It is not intended to diagnose MRSA infection nor to guide or monitor treatment for MRSA infections.   Culture, blood (Routine X 2) w Reflex to ID Panel     Status: None   Collection Time: 06/21/17  8:00 AM  Result Value Ref Range Status   Specimen Description BLOOD RIGHT ANTECUBITAL  Final   Special Requests   Final    BOTTLES DRAWN AEROBIC AND ANAEROBIC Blood Culture results may not be optimal due to an excessive volume of blood received in culture bottles   Culture NO GROWTH 5 DAYS  Final   Report Status 06/26/2017 FINAL  Final  Culture, blood (Routine X 2) w Reflex to ID Panel     Status: None   Collection Time: 06/21/17  8:04 AM  Result Value Ref Range Status   Specimen Description BLOOD BLOOD RIGHT FOREARM  Final   Special Requests   Final    BOTTLES DRAWN AEROBIC AND ANAEROBIC Blood Culture results may not be optimal due to an excessive volume of blood received in culture bottles   Culture NO GROWTH 5 DAYS  Final   Report Status 06/26/2017 FINAL  Final    Coagulation Studies: No results for input(s): LABPROT, INR in  the last 72 hours.  Urinalysis: No results for input(s): COLORURINE, LABSPEC, PHURINE, GLUCOSEU, HGBUR, BILIRUBINUR, KETONESUR, PROTEINUR, UROBILINOGEN, NITRITE, LEUKOCYTESUR in the last 72 hours.  Invalid input(s): APPERANCEUR    Imaging: US Venous Img Lower Unilateral Right  Result Date: 06/27/2017 CLINICAL DATA:  Swelling x4 days EXAM: RIGHT LOWER EXTREMITY VENOUS DOPPLER ULTRASOUND TECHNIQUE: Gray-scale sonography with compression, as well as color and duplex ultrasound, were performed to evaluate the deep venous system from the level of the common femoral vein through the popliteal and proximal calf veins. COMPARISON:  None FINDINGS: Normal compressibility of the common femoral, superficial femoral, and popliteal veins, as well as the proximal calf veins. No filling defects to suggest DVT on grayscale or color Doppler imaging. Doppler waveforms show normal direction of venous flow, normal respiratory phasicity and response to augmentation. There is a complex 3.7 x 1.4 x 0.8 cm fluid collection in the posterior popliteal fossa. Survey views of the contralateral common femoral vein are unremarkable. IMPRESSION: No evidence of  lower extremity deep vein thrombosis, right. Right Baker's cyst. Electronically Signed   By: Corlis Leak M.D.   On: 06/27/2017 08:22   Dg Knee Complete 4 Views Right  Result Date: 06/27/2017 CLINICAL DATA:  Right knee pain. EXAM: RIGHT KNEE - COMPLETE 4+ VIEW COMPARISON:  None. FINDINGS: Mild chondrocalcinosis. Mild spurring and joint space narrowing. Moderate joint effusion. No acute bony abnormality. Specifically, no fracture, subluxation, or dislocation. Soft tissues are intact. Vascular calcifications noted. 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 Moderate joint effusion. No acute bony abnormality. Electronically Signed   By: Charlett Nose M.D.   On: 06/27/2017 10:44     Medications:    . allopurinol  100 mg Oral Daily  .  amitriptyline  10 mg Oral QHS  . [START ON 06/28/2017] amLODipine  10 mg Oral Daily  . aspirin  81 mg Per Tube Daily  . furosemide  40 mg Oral BID  . heparin subcutaneous  5,000 Units Subcutaneous Q8H  . insulin aspart  0-15 Units Subcutaneous TID WC  . insulin aspart  0-5 Units Subcutaneous QHS  . insulin detemir  10 Units Subcutaneous BID AC & HS  . mouth rinse  15 mL  Mouth Rinse q12n4p  . metoprolol tartrate  50 mg Oral BID  . multivitamin with minerals  1 tablet Oral Daily  . omega-3 acid ethyl esters  1 g Oral Daily  . potassium chloride  20 mEq Oral Daily  . protein supplement shake  11 oz Oral BID BM   acetaminophen, ipratropium-albuterol, [DISCONTINUED] ondansetron **OR** ondansetron (ZOFRAN) IV  Assessment/ Plan:  76 y.o. caucasian male with chronic kidney disease stage IV baseline creatinine 2.5-3, coronary artery disease, hypertension, hyperlipidemia, left partial nephrectomy, gout, diabetes mellitus type II insulin dependent, diabetic peripheral neuropathy and osteoarthritis, who [facility]2017 for SOB (shortness of breath) [R06.02] Hypoxia [R09.02] Acute renal failure, unspecified acute renal failure type (HCC) [N17  1.  Acute renal failure 2.  Hyperkalemia 3.  Chronic kidney stage IV 4.  Diabetes type 2 with chronic kidney disease 5.  Acute respiratory failure, requiring ventilator support 6.  Generalized edema  Urine output has significantly improved.  2200 over the last 24 hours.  Serum creatinine has started to improve.  Down from 3.03-2.94. Still has significant volume overload.  Continue Lasix 40 mg orally BID Continue to monitor electrolytes and serum creatinine    LOS: 7 [facility],Spyros Winch 11/17/201810:59 AM  [address] [facility] [place], [place] [phone]

## 2017-06-27 NOTE — Progress Notes (Signed)
[employer] - [place] [facility]   PATIENT NAME: Augusto GambleRalph Benak    MR#:  [id number]  DATE OF BIRTH:  1941-06-29  SUBJECTIVE:  CHIEF COMPLAINT:   Chief Complaint  Patient presents with  . Shortness of Breath   Admitted for congestive heart failure/pulmonary edema which improved with hemodialysis.  Temporary femoral hemodialysis catheter removed.  Good urine output.  Afebrile.  Feels weak. On 2 L oxygen.  REVIEW OF SYSTEMS:     Review of Systems  Constitutional: Negative for fever and malaise/fatigue.  HENT: Negative for congestion, ear discharge and tinnitus.   Eyes: Negative for blurred vision.  Respiratory: Negative for cough, shortness of breath and wheezing.   Cardiovascular: Positive for leg swelling. Negative for chest pain, palpitations and orthopnea.  Gastrointestinal: Negative for abdominal pain, diarrhea, nausea and vomiting.  Genitourinary: Negative for dysuria and urgency.  Skin: Negative for rash.  Neurological: Positive for weakness. Negative for tremors, sensory change and focal weakness.  Psychiatric/Behavioral: Negative for depression and memory loss.    DRUG ALLERGIES:   Allergies  Allergen Reactions  . Neurontin [Gabapentin] Other (See Comments)    Swelling when taking everyday    VITALS:  Blood pressure (!) 156/81, pulse (!) 101, temperature 98.7 F (37.1 C), temperature source Oral, resp. rate 18, height 5\' 8"  (1.727 m), weight 91.1 kg (200 lb 12.8 oz), SpO2 92 %.  PHYSICAL EXAMINATION:  GENERAL:  76 y.o.-year-old patient lying in the bed with no acute distress.  EYES: Pupils equal, round, reactive to light and accommodation. No scleral icterus. Extraocular muscles intact.  HEENT: Head atraumatic, normocephalic. Oropharynx and nasopharynx clear. NECK:  Supple, no jugular venous distention. No thyroid enlargement, no tenderness.  LUNGS: Normal breath sounds bilaterally, no wheezing, rales,rhonchi or crepitation. No use of accessory  muscles of respiration.  CARDIOVASCULAR: S1, S2 normal. No murmurs, rubs, or gallops.  ABDOMEN: Soft, nontender, nondistended. Bowel sounds present. No organomegaly or mass. Rectal tube and Foley in place. EXTREMITIES: b/l pedal edema,no cyanosis, or clubbing. Right groin HD cath in place. NEUROLOGIC: Cranial nerves intact, Moves all 4 limbs spontaneously and follows commands, generalized weakness .  PSYCHIATRIC: The patient is alert and oriented X3.  SKIN: No obvious rash, lesion, or ulcer.   Physical Exam LABORATORY PANEL:   CBC Recent Labs  Lab 06/27/17 0546  WBC 10.8*  HGB 9.7*  HCT 29.8*  PLT 185   ------------------------------------------------------------------------------------------------------------------  Chemistries  Recent Labs  Lab 06/21/17 0605  06/26/17 0436 06/27/17 0546  NA 139   < > 140 140  K 5.9*   < > 3.7 3.9  CL 112*   < > 102 100*  CO2 18*   < > 26 25  GLUCOSE 276*   < > 111* 123*  BUN 150*   < > 75* 80*  CREATININE 5.82*   < > 3.03* 2.94*  CALCIUM 7.2*   < > 8.2* 8.4*  MG 2.5*   < > 2.0  --   AST 14*  --   --   --   ALT 17  --   --   --   ALKPHOS 75  --   --   --   BILITOT 0.5  --   --   --    < > = values in this interval not displayed.   ------------------------------------------------------------------------------------------------------------------  Cardiac Enzymes Recent Labs  Lab 06/20/17 1445 06/21/17 0608  TROPONINI <0.03 0.03*   ------------------------------------------------------------------------------------------------------------------  RADIOLOGY:  [place] Venous Img Lower Unilateral  Right  Result Date: 06/27/2017 CLINICAL DATA:  Swelling x4 days EXAM: RIGHT LOWER EXTREMITY VENOUS DOPPLER ULTRASOUND TECHNIQUE: Gray-scale sonography with compression, as well as color and duplex ultrasound, were performed to evaluate the deep venous system from the level of the common femoral vein through the popliteal and proximal calf  veins. COMPARISON:  None FINDINGS: Normal compressibility of the common femoral, superficial femoral, and popliteal veins, as well as the proximal calf veins. No filling defects to suggest DVT on grayscale or color Doppler imaging. Doppler waveforms show normal direction of venous flow, normal respiratory phasicity and response to augmentation. There is a complex 3.7 x 1.4 x 0.8 cm fluid collection in the posterior popliteal fossa. Survey views of the contralateral common femoral vein are unremarkable. IMPRESSION: No evidence of  lower extremity deep vein thrombosis, right. Right Baker's cyst. Electronically Signed   By: Corlis Leak  Hassell M.D.   On: 06/27/2017 08:22    ASSESSMENT AND PLAN:   Active Problems:   Acute renal failure superimposed on stage 4 chronic kidney disease (HCC)   Acute respiratory failure (HCC)   Acute renal failure (HCC)  1.  Acute respiratory failure due to Acute on chronic diastolic CHF Needed full ventilatory support and later extubated.  Fluid overload improved with temporary hemodialysis.  Hemodialysis catheter removed at this time.  Will start Lasix.  Discussed with nephrology Dr. Thedore MinsSingh.  2.  Diabetes mellitus type 2: Continue levemir per home regimen.  3.  Coronary artery disease: Stable: Continue aspirin 4.  Hypertension Uncontrolled.  Will increase doses of Norvasc and metoprolol.  5.  Right knee pain.  No DVT on ultrasound.  Has right Baker's cyst.  Will get x-ray due to fall prior to admission.  Physical therapy consulted.  Home with home health versus skilled nursing facility.  All the records are reviewed and case discussed with Care Management/Social Workerr. Management plans discussed with the patient, family and they are in agreement.  CODE STATUS: Full.  TOTAL TIME TAKING CARE OF THIS PATIENT: 35 minutes.   POSSIBLE D/C IN 1-2 DAYS, DEPENDING ON CLINICAL CONDITION.  Orie FishermanSrikar R Agapita Savarino M.D on 06/27/2017   Between 7am to 6pm - Pager - [phone]  After  6pm go to [email or web address] - password [email or web address]  [facility]  Office  [phone]  CC: Primary care physician; System, Provider Not In  Note: This dictation was prepared with Dragon dictation along with smaller phrase technology. Any transcriptional errors that result from this process are unintentional.

## 2017-06-27 NOTE — Plan of Care (Signed)
  Progressing Education: Knowledge of General Education information will improve 06/27/2017 1148 - Progressing by Tomie ChinaJackson, Ireene Ballowe Cecelie, RN Health Behavior/Discharge Planning: Ability to manage health-related needs will improve 06/27/2017 1148 - Progressing by Tomie ChinaJackson, Eyvonne Burchfield Cecelie, RN Clinical Measurements: Ability to maintain clinical measurements within normal limits will improve 06/27/2017 1148 - Progressing by Tomie ChinaJackson, Toyoko Silos Cecelie, RN Will remain free from infection 06/27/2017 1148 - Progressing by Tomie ChinaJackson, Brissa Asante Cecelie, RN Diagnostic test results will improve 06/27/2017 1148 - Progressing by Tomie ChinaJackson, Chelbie Jarnagin Cecelie, RN Respiratory complications will improve 06/27/2017 1148 - Progressing by Tomie ChinaJackson, Annita Ratliff Cecelie, RN Cardiovascular complication will be avoided 06/27/2017 1148 - Progressing by Tomie ChinaJackson, Marsa Matteo Cecelie, RN Activity: Risk for activity intolerance will decrease 06/27/2017 1148 - Progressing by Tomie ChinaJackson, Jame Seelig Cecelie, RN Nutrition: Adequate nutrition will be maintained 06/27/2017 1148 - Progressing by Tomie ChinaJackson, Avalina Benko Cecelie, RN Coping: Level of anxiety will decrease 06/27/2017 1148 - Progressing by Tomie ChinaJackson, Montrae Braithwaite Cecelie, RN Elimination: Will not experience complications related to bowel motility 06/27/2017 1148 - Progressing by Tomie ChinaJackson, Jadzia Ibsen Cecelie, RN Will not experience complications related to urinary retention 06/27/2017 1148 - Progressing by Tomie ChinaJackson, Nikia Levels Cecelie, RN Pain Managment: General experience of comfort will improve 06/27/2017 1148 - Progressing by Tomie ChinaJackson, Wilkin Lippy Cecelie, RN Safety: Ability to remain free from injury will improve 06/27/2017 1148 - Progressing by Tomie ChinaJackson, Kemoni Ortega Cecelie, RN Skin Integrity: Risk for impaired skin integrity will decrease 06/27/2017 1148 - Progressing by Tomie ChinaJackson, Callen Zuba Cecelie, RN

## 2017-06-28 LAB — PHOSPHORUS: PHOSPHORUS: 5.9 mg/dL — AB (ref 2.5–4.6)

## 2017-06-28 LAB — GLUCOSE, CAPILLARY
Glucose-Capillary: 116 mg/dL — ABNORMAL HIGH (ref 65–99)
Glucose-Capillary: 181 mg/dL — ABNORMAL HIGH (ref 65–99)
Glucose-Capillary: 114 mg/dL — ABNORMAL HIGH (ref 65–99)
Glucose-Capillary: 178 mg/dL — ABNORMAL HIGH (ref 65–99)

## 2017-06-28 LAB — BASIC METABOLIC PANEL
ANION GAP: 11 (ref 5–15)
BUN: 96 mg/dL — ABNORMAL HIGH (ref 6–20)
CHLORIDE: 99 mmol/L — AB (ref 101–111)
CO2: 28 mmol/L (ref 22–32)
Calcium: 8.5 mg/dL — ABNORMAL LOW (ref 8.9–10.3)
Creatinine, Ser: 2.9 mg/dL — ABNORMAL HIGH (ref 0.61–1.24)
GFR calc non Af Amer: 20 mL/min — ABNORMAL LOW (ref >60)
GFR, EST AFRICAN AMERICAN: 23 mL/min — AB (ref >60)
Glucose, Bld: 113 mg/dL — ABNORMAL HIGH (ref 65–99)
POTASSIUM: 4 mmol/L (ref 3.5–5.1)
SODIUM: 138 mmol/L (ref 135–145)

## 2017-06-28 LAB — MAGNESIUM: MAGNESIUM: 2.2 mg/dL (ref 1.7–2.4)

## 2017-06-28 NOTE — Progress Notes (Signed)
Pharmacy Electrolyte Monitoring Consult  Pharmacy consulted to assist in monitoring and replacing electrolytes in this 76 y.o.  male admitted with Shortness of Breath Patient is no longer on CRRT. Patient is still receiving lasix 40mg  Q12H.   Labs: Sodium (mmol/L)  Date Value  06/28/2017 138   Potassium (mmol/L)  Date Value  06/28/2017 4.0   Magnesium (mg/dL)  Date Value  40/98/119111/18/2018 2.2   Phosphorus (mg/dL)  Date Value  47/82/956211/18/2018 5.9 (H)   Calcium (mg/dL)  Date Value  13/08/657811/18/2018 8.5 (L)   Albumin (g/dL)  Date Value  46/96/295211/14/2018 2.5 (L)    Plan:  Labs within normal limits. No replacement indicated. Recheck BMP as well as Mg and Phos with AM labs.  06/26/2017 0436 K 3.7, Phos 4.6, Mg 2. No replacement indicated. Will recheck electrolytes in 2 days.  11/18:  K 4.0, Mag 2.2, Phos 5.9.  Scr 2.90  CKD 4 No supplementation indicated. Nephrology following. Patient on lasix 40 po bid , KDUR 20 meq PO daily.  MD ordered BMP for am.  Bari MantisKristin Annaya Bangert PharmD Clinical Pharmacist 06/28/2017

## 2017-06-28 NOTE — Progress Notes (Signed)
[facility] [place], [place] 06/28/17  Subjective:   Patient is doing okay.  Urine output 1325 cc.  Lasix continue in oral form Temporary dialysis catheter has been removed Denies any acute shortness of breath Continues to have significant edema Able to eat without nausea or vomiting  Objective:  Vital signs in last 24 hours:  Temp:  [97.1 F (36.2 C)-98.1 F (36.7 C)] 97.1 F (36.2 C) (11/18 0813) Pulse Rate:  [95-100] 98 (11/18 0813) Resp:  [18-22] 22 (11/18 0343) BP: (143-169)/(71-83) 143/71 (11/18 0813) SpO2:  [91 %-95 %] 94 % (11/18 0813) Weight:  [91.1 kg (200 lb 12.8 oz)] 91.1 kg (200 lb 12.8 oz) (11/18 0343)  Weight change: 0 kg (0 lb) Filed Weights   06/26/17 0500 06/27/17 0500 06/28/17 0343  Weight: 92.5 kg (204 lb) 91.1 kg (200 lb 12.8 oz) 91.1 kg (200 lb 12.8 oz)    Intake/Output:    Intake/Output Summary (Last 24 hours) at 06/28/2017 1321 Last data filed at 06/28/2017 1100 Gross per 24 hour  Intake 240 ml  Output 1250 ml  Net -1010 ml     Physical Exam: General:  Chronically ill-appearing  HEENT  nasal cannula oxygen   Neck  no masses  Pulm/lungs  coarse breath sounds bilaterally  CVS/Heart  no rub or gallop  Abdomen:   Soft  Extremities: + Generalized dependent edema  Neurologic:  patient is alert and is able to follow simple commands             Basic Metabolic Panel:  Recent Labs  Lab 06/23/17 2354 06/24/17 0430 06/24/17 0432 06/24/17 0756 06/24/17 1501 06/25/17 0441 06/26/17 0436 06/27/17 0546 06/28/17 0442  NA 138 139 139 138 141 142 140 140 138  K 4.1 3.9 3.9 3.7 3.9 4.0 3.7 3.9 4.0  CL 105 106 106 103 103 104 102 100* 99*  CO2 23 21* 25 24 24 26 26 25 28   GLUCOSE 196* 192* 195* 187* 132* 115* 111* 123* 113*  BUN 66* 73* 73* 75* 76* 72* 75* 80* 96*  CREATININE 2.45* 2.61* 2.45* 2.65* 2.82* 2.91* 3.03* 2.94* 2.90*  CALCIUM 7.9* 7.7* 7.7* 7.8* 8.0* 8.1* 8.2* 8.4* 8.5*  MG 2.0  --  2.0 2.0  --   --  2.0   --  2.2  PHOS 4.9* 5.1*  --  5.3*  --   --  4.6  --  5.9*     CBC: Recent Labs  Lab 06/23/17 0424 06/24/17 0432 06/25/17 0441 06/26/17 0436 06/27/17 0546  WBC 7.8 8.6 9.8 10.2 10.8*  NEUTROABS 5.2  --   --   --   --   HGB 8.5* 8.2* 8.7* 8.8* 9.7*  HCT 26.6* 25.2* 27.0* 26.6* 29.8*  MCV 97.0 96.5 96.2 98.8 96.7  PLT 93* 48* 116* 145* 185     No results found for: HEPBSAG, HEPBSAB, HEPBIGM    Microbiology:  Recent Results (from the past 240 hour(s))  MRSA PCR Screening     Status: None   Collection Time: 06/21/17  5:00 AM  Result Value Ref Range Status   MRSA by PCR NEGATIVE NEGATIVE Final    Comment:        The GeneXpert MRSA Assay (FDA approved for NASAL specimens only), is one component of a comprehensive MRSA colonization surveillance program. It is not intended to diagnose MRSA infection nor to guide or monitor treatment for MRSA infections.   Culture, blood (Routine X 2) w Reflex to ID Panel  Status: None   Collection Time: 06/21/17  8:00 AM  Result Value Ref Range Status   Specimen Description BLOOD RIGHT ANTECUBITAL  Final   Special Requests   Final    BOTTLES DRAWN AEROBIC AND ANAEROBIC Blood Culture results may not be optimal due to an excessive volume of blood received in culture bottles   Culture NO GROWTH 5 DAYS  Final   Report Status 06/26/2017 FINAL  Final  Culture, blood (Routine X 2) w Reflex to ID Panel     Status: None   Collection Time: 06/21/17  8:04 AM  Result Value Ref Range Status   Specimen Description BLOOD BLOOD RIGHT FOREARM  Final   Special Requests   Final    BOTTLES DRAWN AEROBIC AND ANAEROBIC Blood Culture results may not be optimal due to an excessive volume of blood received in culture bottles   Culture NO GROWTH 5 DAYS  Final   Report Status 06/26/2017 FINAL  Final    Coagulation Studies: No results for input(s): LABPROT, INR in the last 72 hours.  Urinalysis: No results for input(s): COLORURINE, LABSPEC, PHURINE,  GLUCOSEU, HGBUR, BILIRUBINUR, KETONESUR, PROTEINUR, UROBILINOGEN, NITRITE, LEUKOCYTESUR in the last 72 hours.  Invalid input(s): APPERANCEUR    Imaging: US Venous Img Lower Unilateral Right  Result Date: 06/27/2017 CLINICAL DATA:  Swelling x4 days EXAM: RIGHT LOWER EXTREMITY VENOUS DOPPLER ULTRASOUND TECHNIQUE: Gray-scale sonography with compression, as well as color and duplex ultrasound, were performed to evaluate the deep venous system from the level of the common femoral vein through the popliteal and proximal calf veins. COMPARISON:  None FINDINGS: Normal compressibility of the common femoral, superficial femoral, and popliteal veins, as well as the proximal calf veins. No filling defects to suggest DVT on grayscale or color Doppler imaging. Doppler waveforms show normal direction of venous flow, normal respiratory phasicity and response to augmentation. There is a complex 3.7 x 1.4 x 0.8 cm fluid collection in the posterior popliteal fossa. Survey views of the contralateral common femoral vein are unremarkable. IMPRESSION: No evidence of  lower extremity deep vein thrombosis, right. Right Baker's cyst. Electronically Signed   By: Corlis Leak M.D.   On: 06/27/2017 08:22   Dg Knee Complete 4 Views Right  Result Date: 06/27/2017 CLINICAL DATA:  Right knee pain. EXAM: RIGHT KNEE - COMPLETE 4+ VIEW COMPARISON:  None. FINDINGS: Mild chondrocalcinosis. Mild spurring and joint space narrowing. Moderate joint effusion. No acute bony abnormality. Specifically, no fracture, subluxation, or dislocation. Soft tissues are intact. Vascular calcifications noted. 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 Moderate joint effusion. No acute bony abnormality. Electronically Signed   By: Charlett Nose M.D.   On: 06/27/2017 10:44     Medications:    . allopurinol  100 mg Oral Daily  . amitriptyline  10 mg Oral QHS  . amLODipine  10 mg Oral Daily  . aspirin  81 mg Per Tube  Daily  . furosemide  40 mg Oral BID  . heparin subcutaneous  5,000 Units Subcutaneous Q8H  . insulin aspart  0-15 Units Subcutaneous TID WC  . insulin aspart  0-5 Units Subcutaneous QHS  . insulin detemir  10 Units Subcutaneous BID AC & HS  . mouth rinse  15 mL Mouth Rinse q12n4p  . metoprolol tartrate  50 mg Oral BID  . multivitamin with minerals  1 tablet Oral Daily  . omega-3 acid ethyl esters  1 g Oral Daily  . potassium chloride  20 mEq Oral Daily  .  protein supplement shake  11 oz Oral BID BM  . sodium chloride flush  3 mL Intravenous Q12H   acetaminophen, ipratropium-albuterol, [DISCONTINUED] ondansetron **OR** ondansetron (ZOFRAN) IV  Assessment/ Plan:  76 y.o. caucasian male with chronic kidney disease stage IV baseline creatinine 2.5-3, coronary artery disease, hypertension, hyperlipidemia, left partial nephrectomy, gout, diabetes mellitus type II insulin dependent, diabetic peripheral neuropathy and osteoarthritis, who was [facility] 06[facility]/2017 for SOB (shortness of breath) [R06.02] Hypoxia [R09.02] Acute renal failure, unspecified acute renal failure type (HCC) [N17  1.  Acute renal failure 2.  Hyperkalemia 3.  Chronic kidney stage IV 4.  Diabetes type 2 with chronic kidney disease 5.  Acute respiratory failure, requiring ventilator support, now on nasal cannula oxygen 6.  Generalized edema  Urine output has significantly improved.  However, he continues to have significant volume overload.  Continue Lasix 40 mg orally BID.  Serum creatinine stable at 2.90 today.  BUN high at 96.  Monitor electrolytes and serum creatinine No acute indication for dialysis at present.  If lab parameters continue to worsen, may require a PermCath    LOS: 8 [facility],Leotis Isham 11/18/20181:21 PM  [address] [facility] [facility] [place], [place] [phone]

## 2017-06-28 NOTE — Progress Notes (Signed)
[employer] - [place] [facility]   PATIENT NAME: Christopher [name]    MR#:  [id number]  DATE OF BIRTH:  05/01/1941  SUBJECTIVE:  CHIEF COMPLAINT:   Chief Complaint  Patient presents with  . Shortness of Breath   Admitted for congestive heart failure/pulmonary edema which improved with hemodialysis.  Temporary femoral hemodialysis catheter removed.  Good urine output.  Afebrile.    Still feels weak  On 2 L oxygen.  Right knee pain is improved  REVIEW OF SYSTEMS:     Review of Systems  Constitutional: Negative for fever and malaise/fatigue.  HENT: Negative for congestion, ear discharge and tinnitus.   Eyes: Negative for blurred vision.  Respiratory: Negative for cough, shortness of breath and wheezing.   Cardiovascular: Positive for leg swelling. Negative for chest pain, palpitations and orthopnea.  Gastrointestinal: Negative for abdominal pain, diarrhea, nausea and vomiting.  Genitourinary: Negative for dysuria and urgency.  Skin: Negative for rash.  Neurological: Positive for weakness. Negative for tremors, sensory change and focal weakness.  Psychiatric/Behavioral: Negative for depression and memory loss.    DRUG ALLERGIES:   Allergies  Allergen Reactions  . Neurontin [Gabapentin] Other (See Comments)    Swelling when taking everyday    VITALS:  Blood pressure (!) 143/71, pulse 98, temperature (!) 97.1 F (36.2 C), resp. rate (!) 22, height 5\' 8"  (1.727 m), weight 91.1 kg (200 lb 12.8 oz), SpO2 94 %.  PHYSICAL EXAMINATION:  GENERAL:  76 y.o.-year-old patient lying in the bed with no acute distress.  EYES: Pupils equal, round, reactive to light and accommodation. No scleral icterus. Extraocular muscles intact.  HEENT: Head atraumatic, normocephalic. Oropharynx and nasopharynx clear. NECK:  Supple, no jugular venous distention. No thyroid enlargement, no tenderness.  LUNGS: Normal breath sounds bilaterally, no wheezing, rales,rhonchi or crepitation.  No use of accessory muscles of respiration.  CARDIOVASCULAR: S1, S2 normal. No murmurs, rubs, or gallops.  ABDOMEN: Soft, nontender, nondistended. Bowel sounds present. No organomegaly or mass. Rectal tube and Foley in place. EXTREMITIES: b/l pedal edema,no cyanosis, or clubbing.  NEUROLOGIC: Cranial nerves intact, Moves all 4 limbs spontaneously and follows commands, generalized weakness .  PSYCHIATRIC: The patient is alert and oriented X3.  SKIN: No obvious rash, lesion, or ulcer.   Physical Exam LABORATORY PANEL:   CBC Recent Labs  Lab 06/27/17 0546  WBC 10.8*  HGB 9.7*  HCT 29.8*  PLT 185   ------------------------------------------------------------------------------------------------------------------  Chemistries  Recent Labs  Lab 06/28/17 0442  NA 138  K 4.0  CL 99*  CO2 28  GLUCOSE 113*  BUN 96*  CREATININE 2.90*  CALCIUM 8.5*  MG 2.2   ------------------------------------------------------------------------------------------------------------------  Cardiac Enzymes No results for input(s): TROPONINI in the last 168 hours. ------------------------------------------------------------------------------------------------------------------  RADIOLOGY:  [place] Venous Img Lower Unilateral Right  Result Date: 06/27/2017 CLINICAL DATA:  Swelling x4 days EXAM: RIGHT LOWER EXTREMITY VENOUS DOPPLER ULTRASOUND TECHNIQUE: Gray-scale sonography with compression, as well as color and duplex ultrasound, were performed to evaluate the deep venous system from the level of the common femoral vein through the popliteal and proximal calf veins. COMPARISON:  None FINDINGS: Normal compressibility of the common femoral, superficial femoral, and popliteal veins, as well as the proximal calf veins. No filling defects to suggest DVT on grayscale or color Doppler imaging. Doppler waveforms show normal direction of venous flow, normal respiratory phasicity and response to augmentation. There is  a complex 3.7 x 1.4 x 0.8 cm fluid collection in the posterior popliteal fossa. Survey  views of the contralateral common femoral vein are unremarkable. IMPRESSION: No evidence of  lower extremity deep vein thrombosis, right. Right Baker's cyst. Electronically Signed   By: Corlis Leak  Hassell M.D.   On: 06/27/2017 08:22   Dg Knee Complete 4 Views Right  Result Date: 06/27/2017 CLINICAL DATA:  Right knee pain. EXAM: RIGHT KNEE - COMPLETE 4+ VIEW COMPARISON:  None. FINDINGS: Mild chondrocalcinosis. Mild spurring and joint space narrowing. Moderate joint effusion. No acute bony abnormality. Specifically, no fracture, subluxation, or dislocation. Soft tissues are intact. Vascular calcifications noted. 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 Moderate joint effusion. No acute bony abnormality. Electronically Signed   By: Charlett NoseKevin  Dover M.D.   On: 06/27/2017 10:44    ASSESSMENT AND PLAN:   Active Problems:   Acute renal failure superimposed on stage 4 chronic kidney disease (HCC)   Acute respiratory failure (HCC)   Acute renal failure (HCC)  1.  Acute respiratory failure due to Acute on chronic diastolic CHF Needed full ventilatory support and later extubated.  Fluid overload improved with temporary hemodialysis.  Hemodialysis catheter removed at this time. Discussed with nephrology Dr. Thedore MinsSingh. ON Lasix PO 40 mg bid  2.  Diabetes mellitus type 2: Continue levemir per home regimen.  3.  Coronary artery disease: Stable: Continue aspirin 4.  Hypertension Uncontrolled.  INcreased doses of Norvasc and metoprolol. Improving  5.  Right knee pain.  No DVT on ultrasound.  Has right Baker's cyst.  Xray shows arthritis and some effusion  Severe weakness.  Physical therapy evaluation.  SNF at discharge.  All the records are reviewed and case discussed with Care Management/Social Worker Management plans discussed with the patient, family and they are in agreement.  CODE STATUS:  Full.  TOTAL TIME TAKING CARE OF THIS PATIENT: 35 minutes.   POSSIBLE D/C IN 1-2 DAYS, DEPENDING ON CLINICAL CONDITION.  Orie FishermanSrikar R Calvin Jablonowski M.D on 06/28/2017   Between 7am to 6pm - Pager - [phone]  After 6pm go to [email or web address] - password [email or web address]  [facility]  Office  [phone]  CC: Primary care physician; System, Provider Not In  Note: This dictation was prepared with Dragon dictation along with smaller phrase technology. Any transcriptional errors that result from this process are unintentional.

## 2017-06-28 NOTE — Care Management Note (Signed)
Case Management Note  Patient Details  Name: Christopher [name] MRN: [id number] Date of Birth: 07-Feb-1941  Subjective/Objective:      [place], [place].               Action/Plan:   Expected Discharge Date:                  Expected Discharge Plan:     In-House Referral:     Discharge planning Services     Post Acute Care Choice:    Choice offered to:     DME Arranged:    DME Agency:     HH Arranged:    HH Agency:     Status of Service:     If discussed at [employer] Length of Stay Meetings, dates discussed:    Additional Comments:  [name] A, RN 06/28/2017, 1:04 PM

## 2017-06-28 NOTE — Plan of Care (Signed)
Encourage patient to get up into recliner while awake and perform exercises while in the bed.

## 2017-06-29 ENCOUNTER — Encounter
Admission: EM | Disposition: A | Discharge status: Skilled Nursing Facility | Payer: Self-pay | Source: Home / Self Care | Attending: Internal Medicine

## 2017-06-29 ENCOUNTER — Encounter: Payer: Self-pay | Admitting: *Deleted

## 2017-06-29 DIAGNOSIS — E119 Type 2 diabetes mellitus without complications: Secondary | ICD-10-CM

## 2017-06-29 DIAGNOSIS — I10 Essential (primary) hypertension: Secondary | ICD-10-CM

## 2017-06-29 DIAGNOSIS — R0602 Shortness of breath: Secondary | ICD-10-CM

## 2017-06-29 DIAGNOSIS — N189 Chronic kidney disease, unspecified: Secondary | ICD-10-CM

## 2017-06-29 HISTORY — PX: DIALYSIS/PERMA CATHETER INSERTION: CATH118288

## 2017-06-29 LAB — BASIC METABOLIC PANEL
Anion gap: 13 (ref 5–15)
BUN: 109 mg/dL — AB (ref 6–20)
CALCIUM: 8.4 mg/dL — AB (ref 8.9–10.3)
CHLORIDE: 102 mmol/L (ref 101–111)
CO2: 26 mmol/L (ref 22–32)
CREATININE: 3.18 mg/dL — AB (ref 0.61–1.24)
GFR, EST AFRICAN AMERICAN: 21 mL/min — AB (ref >60)
GFR, EST NON AFRICAN AMERICAN: 18 mL/min — AB (ref >60)
Glucose, Bld: 153 mg/dL — ABNORMAL HIGH (ref 65–99)
Potassium: 4.5 mmol/L (ref 3.5–5.1)
SODIUM: 141 mmol/L (ref 135–145)

## 2017-06-29 LAB — GLUCOSE, CAPILLARY
Glucose-Capillary: 145 mg/dL — ABNORMAL HIGH (ref 65–99)
Glucose-Capillary: 187 mg/dL — ABNORMAL HIGH (ref 65–99)
Glucose-Capillary: 164 mg/dL — ABNORMAL HIGH (ref 65–99)
Glucose-Capillary: 154 mg/dL — ABNORMAL HIGH (ref 65–99)
Glucose-Capillary: 147 mg/dL — ABNORMAL HIGH (ref 65–99)

## 2017-06-29 SURGERY — DIALYSIS/PERMA CATHETER INSERTION
Anesthesia: Moderate Sedation

## 2017-06-29 MED ORDER — PREDNISONE 50 MG PO TABS
50.0000 mg | ORAL_TABLET | Freq: Every day | ORAL | Status: AC
  Administered 2017-06-29 – 2017-07-01 (×3): 50 mg via ORAL
  Filled 2017-06-29 (×4): qty 1

## 2017-06-29 MED ORDER — TUBERCULIN PPD 5 UNIT/0.1ML ID SOLN
5.0000 [IU] | Freq: Once | INTRADERMAL | Status: AC
  Administered 2017-06-30: 5 [IU] via INTRADERMAL
  Filled 2017-06-29: qty 0.1

## 2017-06-29 MED ORDER — SODIUM CHLORIDE 0.9 % IV SOLN: 100.0000 mL | INTRAVENOUS | Status: DC | PRN

## 2017-06-29 MED ORDER — LIDOCAINE-EPINEPHRINE (PF) 1 %-1:200000 IJ SOLN
INTRAMUSCULAR | Status: AC
  Filled 2017-06-29: qty 30

## 2017-06-29 MED ORDER — HEPARIN SODIUM (PORCINE) 1000 UNIT/ML DIALYSIS
1000.0000 [IU] | INTRAMUSCULAR | Status: DC | PRN
  Filled 2017-06-29: qty 1

## 2017-06-29 MED ORDER — HEPARIN SODIUM (PORCINE) 10000 UNIT/ML IJ SOLN
INTRAMUSCULAR | Status: AC
  Filled 2017-06-29: qty 1

## 2017-06-29 MED ORDER — DEXTROSE 5 % IV SOLN: 1.5000 g | INTRAVENOUS | Status: DC

## 2017-06-29 MED ORDER — MIDAZOLAM HCL 5 MG/5ML IJ SOLN
INTRAMUSCULAR | Status: AC
  Filled 2017-06-29: qty 5

## 2017-06-29 MED ORDER — DEXTROSE 5 % IV SOLN
2000.0000 mg | Freq: Once | INTRAVENOUS | Status: AC
  Administered 2017-06-29: 2000 mg via INTRAVENOUS
  Filled 2017-06-29: qty 20

## 2017-06-29 MED ORDER — MIDAZOLAM HCL 2 MG/2ML IJ SOLN
INTRAMUSCULAR | Status: DC | PRN
  Administered 2017-06-29 (×2): 2 mg via INTRAVENOUS

## 2017-06-29 MED ORDER — LIDOCAINE HCL (PF) 1 % IJ SOLN
5.0000 mL | INTRAMUSCULAR | Status: DC | PRN
  Filled 2017-06-29: qty 5

## 2017-06-29 MED ORDER — HEPARIN (PORCINE) IN NACL 2-0.9 UNIT/ML-% IJ SOLN
INTRAMUSCULAR | Status: AC
Start: 2017-06-29 — End: ?
  Filled 2017-06-29: qty 500

## 2017-06-29 MED ORDER — LIDOCAINE-PRILOCAINE 2.5-2.5 % EX CREA
1.0000 "application " | TOPICAL_CREAM | CUTANEOUS | Status: DC | PRN
  Filled 2017-06-29: qty 5

## 2017-06-29 MED ORDER — ALTEPLASE 2 MG IJ SOLR: 2.0000 mg | Freq: Once | INTRAMUSCULAR | Status: DC | PRN

## 2017-06-29 MED ORDER — PENTAFLUOROPROP-TETRAFLUOROETH EX AERO
1.0000 "application " | INHALATION_SPRAY | CUTANEOUS | Status: DC | PRN
  Filled 2017-06-29: qty 30

## 2017-06-29 MED ORDER — SODIUM CHLORIDE 0.9 % IV SOLN: INTRAVENOUS | Status: DC

## 2017-06-29 MED ORDER — FUROSEMIDE 10 MG/ML IJ SOLN
60.0000 mg | Freq: Once | INTRAMUSCULAR | Status: AC
  Administered 2017-06-29: 60 mg via INTRAVENOUS
  Filled 2017-06-29: qty 6

## 2017-06-29 MED ORDER — HYDROCODONE-ACETAMINOPHEN 5-325 MG PO TABS
1.0000 | ORAL_TABLET | ORAL | Status: DC | PRN
  Administered 2017-06-29 – 2017-07-06 (×3): 1 via ORAL
  Filled 2017-06-29 (×3): qty 1

## 2017-06-29 SURGICAL SUPPLY — 6 items
BIOPATCH RED 1 DISK 7.0 (GAUZE/BANDAGES/DRESSINGS) ×1 IMPLANT
BIOPATCH RED 1IN DISK 7.0MM (GAUZE/BANDAGES/DRESSINGS) ×1
CATH PALINDROME RT-P 15FX19CM (CATHETERS) ×2 IMPLANT
PACK ANGIOGRAPHY (CUSTOM PROCEDURE TRAY) ×2 IMPLANT
SUT MNCRL AB 4-0 PS2 18 (SUTURE) ×2 IMPLANT
SUT PROLENE 0 CT 1 30 (SUTURE) ×2 IMPLANT

## 2017-06-29 NOTE — Progress Notes (Signed)
Pre hd 

## 2017-06-29 NOTE — Clinical Social Work Note (Signed)
CSW presented bed offers to patient and he chose [facility] in [place].  CSW contacted SNF and they can accept patient once he is medically ready for discharge and orders have received.  CSW to continue to follow patient's progress throughout discharge planning.  Ervin KnackEric R. Hassan Rowannterhaus, MSW, Theresia MajorsLCSWA [phone]  06/29/2017 4:48 PM

## 2017-06-29 NOTE — Progress Notes (Signed)
[facility] [place], [place] 06/29/17  Subjective:  Urine output was only 775 cc over the preceding 24 hours. BUN up to 109 with a creatinine of 3.1. Patient is agreeable to having PermCath placed so that dialysis can be done.   Objective:  Vital signs in last 24 hours:  Temp:  [97.7 F (36.5 C)-98.6 F (37 C)] 97.7 F (36.5 C) (11/19 1132) Pulse Rate:  [91-102] 94 (11/19 1132) Resp:  [18] 18 (11/19 1132) BP: (141-157)/(70-81) 149/70 (11/19 1132) SpO2:  [91 %-96 %] 94 % (11/19 1132) Weight:  [85.8 kg (189 lb 3.2 oz)] 85.8 kg (189 lb 3.2 oz) (11/19 0522)  Weight change: -5.262 kg (-9.6 oz) Filed Weights   06/27/17 0500 06/28/17 0343 06/29/17 0522  Weight: 91.1 kg (200 lb 12.8 oz) 91.1 kg (200 lb 12.8 oz) 85.8 kg (189 lb 3.2 oz)    Intake/Output:    Intake/Output Summary (Last 24 hours) at 06/29/2017 1158 Last data filed at 06/29/2017 1134 Gross per 24 hour  Intake 3 ml  Output 275 ml  Net -272 ml     Physical Exam: General:  Chronically ill-appearing  HEENT  nasal cannula oxygen, hearing intact, OM moist  Neck  supple  Pulm/lungs  coarse breath sounds bilaterally  CVS/Heart  S1S2 no rubs  Abdomen:   Soft NTND BS present  Extremities: + Generalized dependent edema  Neurologic:   Awake, alert, follows commands             Basic Metabolic Panel:  Recent Labs  Lab 06/23/17 2354 06/24/17 0430 06/24/17 0432 06/24/17 0756  06/25/17 0441 06/26/17 0436 06/27/17 0546 06/28/17 0442 06/29/17 0542  NA 138 139 139 138   < > 142 140 140 138 141  K 4.1 3.9 3.9 3.7   < > 4.0 3.7 3.9 4.0 4.5  CL 105 106 106 103   < > 104 102 100* 99* 102  CO2 23 21* 25 24   < > 26 26 25 28 26   GLUCOSE 196* 192* 195* 187*   < > 115* 111* 123* 113* 153*  BUN 66* 73* 73* 75*   < > 72* 75* 80* 96* 109*  CREATININE 2.45* 2.61* 2.45* 2.65*   < > 2.91* 3.03* 2.94* 2.90* 3.18*  CALCIUM 7.9* 7.7* 7.7* 7.8*   < > 8.1* 8.2* 8.4* 8.5* 8.4*  MG 2.0  --  2.0 2.0  --   --   2.0  --  2.2  --   PHOS 4.9* 5.1*  --  5.3*  --   --  4.6  --  5.9*  --    < > = values in this interval not displayed.     CBC: Recent Labs  Lab 06/23/17 0424 06/24/17 0432 06/25/17 0441 06/26/17 0436 06/27/17 0546  WBC 7.8 8.6 9.8 10.2 10.8*  NEUTROABS 5.2  --   --   --   --   HGB 8.5* 8.2* 8.7* 8.8* 9.7*  HCT 26.6* 25.2* 27.0* 26.6* 29.8*  MCV 97.0 96.5 96.2 98.8 96.7  PLT 93* 48* 116* 145* 185     No results found for: HEPBSAG, HEPBSAB, HEPBIGM    Microbiology:  Recent Results (from the past 240 hour(s))  MRSA PCR Screening     Status: None   Collection Time: 06/21/17  5:00 AM  Result Value Ref Range Status   MRSA by PCR NEGATIVE NEGATIVE Final    Comment:        The GeneXpert MRSA Assay (FDA  approved for NASAL specimens only), is one component of a comprehensive MRSA colonization surveillance program. It is not intended to diagnose MRSA infection nor to guide or monitor treatment for MRSA infections.   Culture, blood (Routine X 2) w Reflex to ID Panel     Status: None   Collection Time: 06/21/17  8:00 AM  Result Value Ref Range Status   Specimen Description BLOOD RIGHT ANTECUBITAL  Final   Special Requests   Final    BOTTLES DRAWN AEROBIC AND ANAEROBIC Blood Culture results may not be optimal due to an excessive volume of blood received in culture bottles   Culture NO GROWTH 5 DAYS  Final   Report Status 06/26/2017 FINAL  Final  Culture, blood (Routine X 2) w Reflex to ID Panel     Status: None   Collection Time: 06/21/17  8:04 AM  Result Value Ref Range Status   Specimen Description BLOOD BLOOD RIGHT FOREARM  Final   Special Requests   Final    BOTTLES DRAWN AEROBIC AND ANAEROBIC Blood Culture results may not be optimal due to an excessive volume of blood received in culture bottles   Culture NO GROWTH 5 DAYS  Final   Report Status 06/26/2017 FINAL  Final    Coagulation Studies: No results for input(s): LABPROT, INR in the last 72  hours.  Urinalysis: No results for input(s): COLORURINE, LABSPEC, PHURINE, GLUCOSEU, HGBUR, BILIRUBINUR, KETONESUR, PROTEINUR, UROBILINOGEN, NITRITE, LEUKOCYTESUR in the last 72 hours.  Invalid input(s): APPERANCEUR    Imaging: No results found.   Medications:    . allopurinol  100 mg Oral Daily  . amitriptyline  10 mg Oral QHS  . amLODipine  10 mg Oral Daily  . aspirin  81 mg Per Tube Daily  . furosemide  40 mg Oral BID  . heparin subcutaneous  5,000 Units Subcutaneous Q8H  . insulin aspart  0-15 Units Subcutaneous TID WC  . insulin aspart  0-5 Units Subcutaneous QHS  . insulin detemir  10 Units Subcutaneous BID AC & HS  . mouth rinse  15 mL Mouth Rinse q12n4p  . metoprolol tartrate  50 mg Oral BID  . multivitamin with minerals  1 tablet Oral Daily  . omega-3 acid ethyl esters  1 g Oral Daily  . potassium chloride  20 mEq Oral Daily  . predniSONE  50 mg Oral Q breakfast  . protein supplement shake  11 oz Oral BID BM  . sodium chloride flush  3 mL Intravenous Q12H   acetaminophen, ipratropium-albuterol, [DISCONTINUED] ondansetron **OR** ondansetron (ZOFRAN) IV  Assessment/ Plan:  76 y.o. caucasian male with chronic kidney disease stage IV baseline creatinine 2.5-3, coronary artery disease, hypertension, hyperlipidemia, left partial nephrectomy, gout, diabetes mellitus type II insulin dependent, diabetic peripheral neuropathy and osteoarthritis, who was admitted to [facility] on 06/20/2017 for SOB (shortness of breath) [R06.02] Hypoxia [R09.02] Acute renal failure, unspecified acute renal failure type (HCC) [N17  1.  Acute renal failure 2.  Hyperkalemia 3.  Chronic kidney stage IV 4.  Diabetes type 2 with chronic kidney disease 5.  Acute respiratory failure, requiring ventilator support, now on nasal cannula oxygen 6.  Generalized edema  - Creatinine has risen to 3.18 with a BUN of 109.  Urine output was only 775 cc over the preceding 24 hours.  Therefore we recommend that  dialysis be reinitiated.  We will request vascular surgery to place a PermCath given the fact that dialysis has been rather prolonged.  We will also begin the  process for potential placement into an outpatient dialysis center.  We will continue to monitor progress closely.    LOS: 9 Ahlivia Salahuddin 11/19/201811:58 AM  [address] [place], [place] [phone]

## 2017-06-29 NOTE — Progress Notes (Signed)
OT Cancellation Note  Patient Details Name: Christopher [name] MRN: [id number] DOB: 1941/07/17   Cancelled Treatment:    Reason Eval/Treat Not Completed: Patient at procedure or test/ unavailable. Upon attempt, pt out of room for procedure. Had R IJ permcath placed for long term dialysis. Will re-attempt OT treatment next date.  Richrd PrimeJamie Stiller, MPH, MS, OTR/L ascom [phone] 06/29/17, 2:13 PM

## 2017-06-29 NOTE — Care Management (Signed)
It has been determined that patient is going to require dialysis after discharge. Obtained order for PPD and Christopher [name] with [facility] is aware

## 2017-06-29 NOTE — Progress Notes (Signed)
HD tx ended, system clotted x2.  Pt 1st TX, will notify MD of possible use of heparin next treatment. Pt does not appear to be in any distress. UF 1 liter removed. Pt denies sob or any other discomforts.

## 2017-06-29 NOTE — Consult Note (Signed)
[facility] Vascular Consult Note  MRN : [id number]  Maralyn SagoRalph T Sonneborn is a 76 y.o. ([birth year]) male who presents with chief complaint of  Chief Complaint  Patient presents with  . Shortness of Breath  .  History of Present Illness: I am asked by Dr. Elpidio AnisSudini and Dr. Cherylann RatelLateef to see the patient regarding permcath placement.  The patient has had prolonged renal failure for well over a week.  He initially had some improvement and had his dialysis catheter 2 days ago.  This was a temporary dialysis catheter.  Over that time, his urine output has been poor and his renal function has worsened.  His BUN is now up over 100 with a creatinine of 3.1.  It is clear that he needs continued dialysis, and he likely is now either end-stage renal disease or having very long termed renal failure with prolonged recovery.  Either way, he will need a tunneled dialysis catheter for dialysis now and at discharge.  No fevers or chills.  No chest pain.  Generalized weakness and poor appetite is present.  Current Facility-Administered Medications  Medication Dose Route Frequency Provider Last Rate Last Dose  . 0.9 %  sodium chloride infusion   Intravenous Continuous Annice Needyew, Zebulun Deman S, MD      . acetaminophen (TYLENOL) tablet 650 mg  650 mg Oral Q4H PRN Altamese DillingVachhani, Vaibhavkumar, MD   650 mg at 06/28/17 1949  . allopurinol (ZYLOPRIM) tablet 100 mg  100 mg Oral Daily Altamese DillingVachhani, Vaibhavkumar, MD   100 mg at 06/29/17 1134  . amitriptyline (ELAVIL) tablet 10 mg  10 mg Oral QHS Altamese DillingVachhani, Vaibhavkumar, MD   10 mg at 06/28/17 2155  . amLODipine (NORVASC) tablet 10 mg  10 mg Oral Daily Milagros LollSudini, Srikar, MD   10 mg at 06/29/17 1133  . aspirin chewable tablet 81 mg  81 mg Per Tube Daily Merwyn KatosSimonds, David B, MD   81 mg at 06/29/17 1134  . ceFAZolin (ANCEF) 2,000 mg in dextrose 5 % 50 mL IVPB  2,000 mg Intravenous Once Annice Needyew, Shoaib Siefker S, MD      . furosemide (LASIX) tablet 40 mg  40 mg Oral BID Milagros LollSudini, Srikar, MD   40 mg at 06/29/17  0842  . heparin injection 5,000 Units  5,000 Units Subcutaneous Q8H Altamese DillingVachhani, Vaibhavkumar, MD   5,000 Units at 06/29/17 [phone]  . insulin aspart (novoLOG) injection 0-15 Units  0-15 Units Subcutaneous TID WC Merwyn KatosSimonds, David B, MD   3 Units at 06/29/17 1156  . insulin aspart (novoLOG) injection 0-5 Units  0-5 Units Subcutaneous QHS Merwyn KatosSimonds, David B, MD      . insulin detemir (LEVEMIR) injection 10 Units  10 Units Subcutaneous BID AC & HS Altamese DillingVachhani, Vaibhavkumar, MD   10 Units at 06/29/17 0840  . ipratropium-albuterol (DUONEB) 0.5-2.5 (3) MG/3ML nebulizer solution 3 mL  3 mL Nebulization Q4H PRN Merwyn KatosSimonds, David B, MD      . MEDLINE mouth rinse  15 mL Mouth Rinse q12n4p Merwyn KatosSimonds, David B, MD   15 mL at 06/27/17 1646  . metoprolol tartrate (LOPRESSOR) tablet 50 mg  50 mg Oral BID Milagros LollSudini, Srikar, MD   50 mg at 06/29/17 1134  . midazolam (VERSED) injection    PRN Annice Needyew, Tenoch Mcclure S, MD   2 mg at 06/29/17 1249  . multivitamin with minerals tablet 1 tablet  1 tablet Oral Daily Altamese DillingVachhani, Vaibhavkumar, MD   1 tablet at 06/29/17 1133  . omega-3 acid ethyl esters (LOVAZA) capsule 1 g  1 g Oral Daily Altamese Dilling, MD   1 g at 06/29/17 1133  . ondansetron (ZOFRAN) injection 4 mg  4 mg Intravenous Q6H PRN Arnaldo Natal, MD   4 mg at 06/27/17 1343  . potassium chloride SA (K-DUR,KLOR-CON) CR tablet 20 mEq  20 mEq Oral Daily Altamese Dilling, MD   20 mEq at 06/29/17 1133  . predniSONE (DELTASONE) tablet 50 mg  50 mg Oral Q breakfast Milagros Loll, MD   50 mg at 06/29/17 1157  . protein supplement (PREMIER PROTEIN) liquid  11 oz Oral BID BM Merwyn Katos, MD   11 oz at 06/29/17 1141  . sodium chloride flush (NS) 0.9 % injection 3 mL  3 mL Intravenous Q12H Milagros Loll, MD   3 mL at 06/29/17 1134    Past Medical History:  Diagnosis Date  . Anginal pain (HCC)   . Anxiety   . Arthritis    "all over"  . Chronic back pain greater than 3 months duration   . 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 Coronary artery disease    [facility]  . Hyperlipemia   . Hypertension    stress test done    [facility] in [place]  . Kidney stones   . Myocardial infarction (HCC) 2003  . 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 Pneumonia    "when I was a baby"  . Type II diabetes mellitus (HCC)    "take a pill for it"  . Urination, excessive at night     Past Surgical History:  Procedure Laterality Date  . ANTERIOR FUSION CERVICAL SPINE  2005  . BACK SURGERY    . CORONARY ANGIOPLASTY     2003      [facility]  . INCISION AND DRAINAGE OF WOUND  ~ 2003   "stuck welding tip in my thump; left; got staph infection in it"  . JOINT REPLACEMENT    . LUMBAR DISC SURGERY  1970's   "put serum made out of papaya fruit; took it off the market"  . PARTIAL NEPHRECTOMY  ~ 2000   left; "took tumor out"  . POSTERIOR CERVICAL FUSION/FORAMINOTOMY LEVEL 1 N/A 07/25/2011   Performed by Karn Cassis, MD at [facility]URO ORS  . ROTATOR CUFF REPAIR  1980's   bilateral  . THORACIC DISCECTOMY  04/07/2012  . THORACIC DISCECTOMY N/A 04/07/2012   Performed by Karn Cassis, MD at [facility] NEURO ORS  . TOTAL KNEE ARTHROPLASTY  2005   left    Social History Social History   Tobacco Use  . Smoking status: Former Smoker    Packs/day: 2.00    Years: 30.00    Pack years: 60.00    Types: Cigarettes    Last attempt to quit: 07/17/1985    Years since quitting: 31.9  . Smokeless tobacco: Former Neurosurgeon    Types: Chew  . Tobacco comment: 04/07/2012 "stopped chewing back in the 1980's"  Substance Use Topics  . Alcohol use: Yes    Comment: 04/07/2012 "'bout a beer a month if that"  . Drug use: No    Family History No bleeding disorders, clotting disorders, autoimmune diseases, or aneurysms  Allergies  Allergen Reactions  . Neurontin [Gabapentin] Other (See Comments)    Swelling when taking everyday     REVIEW OF SYSTEMS (Negative unless  checked)  Constitutional: [] Weight loss  [] Fever  [] Chills Cardiac: [] Chest  pain   [] Chest pressure   [x] Palpitations   [x] Shortness of breath when laying flat   [] Shortness of breath at rest   [x] Shortness of breath with exertion. Vascular:  [] Pain in legs with walking   [] Pain in legs at rest   [] Pain in legs when laying flat   [] Claudication   [] Pain in feet when walking  [] Pain in feet at rest  [] Pain in feet when laying flat   [] History of DVT   [] Phlebitis   [x] Swelling in legs   [] Varicose veins   [] Non-healing ulcers Pulmonary:   [] Uses home oxygen   [] Productive cough   [] Hemoptysis   [] Wheeze  [] COPD   [] Asthma Neurologic:  [] Dizziness  [] Blackouts   [] Seizures   [] History of stroke   [] History of TIA  [] Aphasia   [] Temporary blindness   [] Dysphagia   [] Weakness or numbness in arms   [] Weakness or numbness in legs Musculoskeletal:  [x] Arthritis   [] Joint swelling   [x] Joint pain   [x] Low back pain Hematologic:  [] Easy bruising  [] Easy bleeding   [] Hypercoagulable state   [] Anemic  [] Hepatitis Gastrointestinal:  [] Blood in stool   [] Vomiting blood  [] Gastroesophageal reflux/heartburn   [] Difficulty swallowing. Genitourinary:  [x] Chronic kidney disease   [] Difficult urination  [] Frequent urination  [] Burning with urination   [] Blood in urine Skin:  [] Rashes   [] Ulcers   [] Wounds Psychological:  [x] History of anxiety   [x]  History of major depression.  Physical Examination  Vitals:   06/28/17 1952 06/29/17 0522 06/29/17 1132 06/29/17 1229  BP: (!) 141/72 (!) 157/81 (!) 149/70 139/74  Pulse: 97 (!) 102 94 77  Resp:  18 18 14   Temp: 97.9 F (36.6 C) 98.6 F (37 C) 97.7 F (36.5 C) 97.8 F (36.6 C)  TempSrc: Oral Oral Oral Oral  SpO2: 93% 91% 94% 94%  Weight:  85.8 kg (189 lb 3.2 oz)  85.7 kg (189 lb)  Height:    5\' 8"  (1.727 m)   Body mass index is 28.74 kg/m. Gen:  WD/WN, NAD Head: [place]/AT, No temporalis wasting.  Ear/Nose/Throat: Hearing grossly intact, nares w/o erythema or  drainage, oropharynx w/o Erythema/Exudate Eyes: Sclera non-icteric, conjunctiva clear Neck: Trachea midline.  No JVD.  Pulmonary:  Good air movement, respirations not labored, equal bilaterally.  Cardiac: tachycardic and slightly irregular Vascular:  Vessel Right Left  Radial Palpable Palpable                                    Musculoskeletal: M/S 5/5 throughout.  Extremities without ischemic changes.  No deformity or atrophy. Mild to moderate LE edema. Neurologic: Sensation grossly intact in extremities.  Symmetrical.  Speech is fluent. Motor exam as listed above. Psychiatric: Judgment intact, Mood & affect appropriate for pt's clinical situation. Dermatologic: No rashes or ulcers noted.  No cellulitis or open wounds.       CBC Lab Results  Component Value Date   WBC 10.8 (H) 06/27/2017   HGB 9.7 (L) 06/27/2017   HCT 29.8 (L) 06/27/2017   MCV 96.7 06/27/2017   PLT 185 06/27/2017    BMET    Component Value Date/Time   NA 141 06/29/2017 0542   K 4.5 06/29/2017 0542   CL 102 06/29/2017 0542   CO2 26 06/29/2017 0542   GLUCOSE 153 (H) 06/29/2017 0542   BUN 109 (H) 06/29/2017 0542   CREATININE 3.18 (H) 06/29/2017 0542   CALCIUM 8.4 (  L) 06/29/2017 0542   GFRNONAA 18 (L) 06/29/2017 0542   GFRAA 21 (L) 06/29/2017 0542   Estimated Creatinine Clearance: 21.7 mL/min (A) (by C-G formula based on SCr of 3.18 mg/dL (H)).  COAG Lab Results  Component Value Date   INR 1.20 06/21/2017    Radiology Dg Abd 1 View  Result Date: 06/21/2017 CLINICAL DATA:  Nasogastric tube placement. EXAM: ABDOMEN - 1 VIEW COMPARISON:  Lumbar spine radiographs performed 04/17/2017 FINDINGS: The patient's enteric tube is noted ending overlying the body of the stomach. The visualized bowel gas pattern is grossly unremarkable. No free intra-abdominal air is seen, though evaluation for free air is limited on a single supine view. Mild degenerative change is noted along the lumbar spine. The  patient is status post median sternotomy. IMPRESSION: Enteric tube noted ending overlying the body of the stomach. Electronically Signed   By: Roanna Raider M.D.   On: 06/21/2017 06:27   US Renal  Result Date: 06/21/2017 CLINICAL DATA:  Acute renal failure EXAM: RENAL ULTRASOUND COMPARISON:  None. FINDINGS: Right Kidney: Length: 12.2 cm. Echogenicity and renal cortical thickness are within normal limits. No perinephric fluid or hydronephrosis visualized. There is a cyst in the mid right kidney measuring 2.1 x 2.3 x 2.2 cm. There is a second nearby cyst measuring 1.8 x 1.5 x 1.5 cm. No sonographically demonstrable calculus or ureterectasis. Left Kidney: Length: 12.3 cm. Echogenicity and renal cortical thickness are within normal limits. No perinephric fluid or hydronephrosis visualized. There is a cyst in the lower pole left kidney measuring 2.2 x 2.5 x 2.4 cm. There is an adjacent lower pole left renal cyst measuring 2.4 x 1.8 x 1.8 cm. There is a smaller cyst more superiorly on the left measuring 1.4 x 1.4 x 1.4 cm. No sonographically demonstrable calculus or ureterectasis. Bladder: Foley catheter in place. Urinary bladder empty and cannot be assessed. IMPRESSION: There are small renal cysts bilaterally. No noncystic renal masses evident. No obstructing focus in either kidney. The renal cortical thickness and echogenicity are normal bilaterally. Urinary bladder decompressed and cannot be assessed. Electronically Signed   By: Bretta Bang III M.D.   On: 06/21/2017 11:28   US Venous Img Lower Unilateral Right  Result Date: 06/27/2017 CLINICAL DATA:  Swelling x4 days EXAM: RIGHT LOWER EXTREMITY VENOUS DOPPLER ULTRASOUND TECHNIQUE: Gray-scale sonography with compression, as well as color and duplex ultrasound, were performed to evaluate the deep venous system from the level of the common femoral vein through the popliteal and proximal calf veins. COMPARISON:  None FINDINGS: Normal compressibility of the  common femoral, superficial femoral, and popliteal veins, as well as the proximal calf veins. No filling defects to suggest DVT on grayscale or color Doppler imaging. Doppler waveforms show normal direction of venous flow, normal respiratory phasicity and response to augmentation. There is a complex 3.7 x 1.4 x 0.8 cm fluid collection in the posterior popliteal fossa. Survey views of the contralateral common femoral vein are unremarkable. IMPRESSION: No evidence of  lower extremity deep vein thrombosis, right. Right Baker's cyst. Electronically Signed   By: Corlis Leak M.D.   On: 06/27/2017 08:22   Dg Chest Port 1 View  Result Date: 06/25/2017 CLINICAL DATA:  Respiratory failure EXAM: PORTABLE CHEST 1 VIEW COMPARISON:  Yesterday FINDINGS: Low volume chest with streaky opacities that are stable from yesterday. Stable heart size and mediastinal contours. Status post CABG. Right IJ catheter with tip at the upper cavoatrial junction. No edema, effusion, or pneumothorax. Postoperative cervical  spine and right shoulder. IMPRESSION: Lower volumes after extubation. Similar appearing atelectasis at the bases. Electronically Signed   By: Marnee SpringJonathon  Watts M.D.   On: 06/25/2017 07:05   Dg Chest Port 1 View  Result Date: 06/24/2017 CLINICAL DATA:  Respiratory failure. EXAM: PORTABLE CHEST 1 VIEW COMPARISON:  One-view chest x-ray 06/22/2017. FINDINGS: Endotracheal tube is stable. Lung volumes remain low. Mild pulmonary vascular congestion is present. A right IJ line is stable. Bibasilar atelectasis is present. Aortic atherosclerosis is noted. IMPRESSION: 1. Stable low lung volumes with mild pulmonary vascular congestion bibasilar airspace disease, likely atelectasis. 2. Support apparatus is stable. Electronically Signed   By: Marin Robertshristopher  Mattern M.D.   On: 06/24/2017 07:16   Portable Chest Xray  Result Date: 06/22/2017 CLINICAL DATA:  Acute respiratory failure. EXAM: PORTABLE CHEST 1 VIEW COMPARISON:  One-view chest  x-ray 06/21/2017. FINDINGS: The endotracheal tube is stable. A right IJ line is stable. NG tube courses off the inferior border of the film. Lung volumes remain low. Small bilateral pleural effusions and associated airspace disease are stable. Aeration is slightly improved. Aortic atherosclerosis is again noted. IMPRESSION: 1. Slight improved aeration and decreasing pulmonary vascular congestion. 2. Persistent low lung volumes with bilateral pleural effusions and basilar airspace disease, likely atelectasis. 3. The support apparatus is stable. Electronically Signed   By: Marin Robertshristopher  Mattern M.D.   On: 06/22/2017 07:13   Dg Chest Port 1 View  Result Date: 06/21/2017 CLINICAL DATA:  Nasogastric tube placement. EXAM: PORTABLE CHEST 1 VIEW COMPARISON:  Chest radiograph performed 06/20/2017 FINDINGS: The patient's enteric tube is noted extending below the diaphragm. The patient's endotracheal tube is seen ending 5 cm above the carina. The lungs are hypoexpanded. Small bilateral pleural effusions are seen. Bibasilar airspace opacities may reflect mild interstitial edema or atelectasis. No pneumothorax is seen. The cardiomediastinal silhouette is normal in size. The patient is status post median sternotomy, with evidence of prior CABG. No acute osseous abnormalities are identified. A right IJ line is noted ending about the mid SVC. Cervical spinal fusion hardware is noted. IMPRESSION: 1. Enteric tube noted extending below the diaphragm. 2. Endotracheal tube noted ending 5 cm above the carina. 3. Lungs hypoexpanded. Small bilateral pleural effusions noted. Bibasilar airspace opacities may reflect mild interstitial edema or atelectasis. Electronically Signed   By: Roanna RaiderJeffery  Chang M.D.   On: 06/21/2017 06:26   Dg Chest Portable 1 View  Result Date: 06/20/2017 CLINICAL DATA:  Shortness of Breath EXAM: PORTABLE CHEST 1 VIEW COMPARISON:  August 01, 2011 FINDINGS: There is atelectatic change in the bases. The lungs  elsewhere are clear. Heart is upper normal in size with pulmonary vascularity within normal limits. Patient is status post coronary artery bypass grafting. There is aortic atherosclerosis. There is postoperative change in the lower cervical spine. There is also postoperative change in the right shoulder. There is degenerative change in each shoulder. IMPRESSION: Bibasilar atelectasis. No edema or consolidation. Heart size upper normal with pulmonary vascularity normal. Status post coronary artery bypass grafting. There is aortic atherosclerosis. Aortic Atherosclerosis (ICD10-I70.0). Electronically Signed   By: Bretta BangWilliam  Woodruff III M.D.   On: 06/20/2017 15:10   Dg Knee Complete 4 Views Right  Result Date: 06/27/2017 CLINICAL DATA:  Right knee pain. EXAM: RIGHT KNEE - COMPLETE 4+ VIEW COMPARISON:  None. FINDINGS: Mild chondrocalcinosis. Mild spurring and joint space narrowing. Moderate joint effusion. No acute bony abnormality. Specifically, no fracture, subluxation, or dislocation. Soft tissues are intact. Vascular calcifications noted. IMPRESSION: Chondrocalcinosis with mild tricompartment  degenerative changes. Moderate joint effusion. No acute bony abnormality. Electronically Signed   By: Charlett Nose M.D.   On: 06/27/2017 10:44      Assessment/Plan 1. Renal failure.  Likely ESRD at this point or very long term recovery.  As such, we have been requested to place a tunneled HD catheter today.  Risks and benefits have been discussed with the patient and he is agreeable to proceed.  We can assess him as an outpatient  and if his renal function does not improve, he may need to be evaluated for fistula creation 2.  Hypertension.  Likely an underlying cause of long-term renal disease and blood pressure control important in reducing the progression of atherosclerotic disease. On appropriate oral medications. 3.  Diabetes.  Likely underlying cause of long-term renal disease and blood glucose control important  in reducing the progression of atherosclerotic disease. Also, involved in wound healing. On appropriate medications.    Festus Barren, MD  06/29/2017 12:52 PM    This note was created with Dragon medical transcription system.  Any error is purely unintentional

## 2017-06-29 NOTE — Op Note (Signed)
OPERATIVE NOTE    PRE-OPERATIVE DIAGNOSIS: 1. Prolonged renal failure, likely ESRD   POST-OPERATIVE DIAGNOSIS: same as above  PROCEDURE: 1. Ultrasound guidance for vascular access to the right internal jugular vein 2. Fluoroscopic guidance for placement of catheter 3. Placement of a 19 cm tip to cuff tunneled hemodialysis catheter via the right internal jugular vein  SURGEON: Festus BarrenJason , MD  ANESTHESIA:  Local with sedation for approximately 15 minutes using 4 mg of Versed   ESTIMATED BLOOD LOSS: 15 cc  FLUORO TIME: less than one minute  CONTRAST: none  FINDING(S): 1.  Patent right internal jugular vein  SPECIMEN(S):  None  INDICATIONS:   Christopher SagoRalph T Zuleta is a 76 y.o.male who presents with renal failure that has not improved in over a week.  A temporary catheter was placed and then removed, but his BUN and Cr continue to climb and his output has been poor.  The patient needs long term dialysis access for their ESRD, and a Permcath is necessary.  Risks and benefits are discussed and informed consent is obtained.    DESCRIPTION: After obtaining full informed written consent, the patient was brought back to the vascular suited. The patient's right neck and chest were sterilely prepped and draped in a sterile surgical field was created. Moderate conscious sedation was administered during a face to face encounter with the patient throughout the procedure with my supervision of the RN administering medicines and monitoring the patient's vital signs, pulse oximetry, telemetry and mental status throughout from the start of the procedure until the patient was taken to the recovery room.  The right internal jugular vein was visualized with ultrasound and found to be patent. It was then accessed under direct ultrasound guidance and a permanent image was recorded. A wire was placed. After skin nick and dilatation, the peel-away sheath was placed over the wire. I then turned my attention to an area  under the clavicle. Approximately 1-2 fingerbreadths below the clavicle a small counterincision was created and tunneled from the subclavicular incision to the access site. Using fluoroscopic guidance, a 19 centimeter tip to cuff tunneled hemodialysis catheter was selected, and tunneled from the subclavicular incision to the access site. It was then placed through the peel-away sheath and the peel-away sheath was removed. Using fluoroscopic guidance the catheter tips were parked in the right atrium. The appropriate distal connectors were placed. It withdrew blood well and flushed easily with heparinized saline and a concentrated heparin solution was then placed. It was secured to the chest wall with 2 Prolene sutures. The access incision was closed single 4-0 Monocryl. A 4-0 Monocryl pursestring suture was placed around the exit site. Sterile dressings were placed. The patient tolerated the procedure well and was taken to the recovery room in stable condition.  COMPLICATIONS: None  CONDITION: Stable  Festus BarrenJason , MD 06/29/2017 1:12 PM   This note was created with Dragon Medical transcription system. Any errors in dictation are purely unintentional.

## 2017-06-29 NOTE — Progress Notes (Signed)
Physical Therapy Treatment Patient Details Name: Christopher SagoRalph T Fischler MRN: [id number] DOB: [birth year] Today's Date: 06/29/2017    History of Present Illness Pt is a 76 y.o. male presenting to hospital 06/20/17 with SOB (pt with significant SOB coming back from fishing trip; O2 noted to be 67% and EMS initiated CPAP).  Pt admitted to hospital with acute on chronic kidney disease (fluid overloaded resulting in hypoxia).  Pt transferred to CCU 06/21/17 secondary to unresponsiveness and respiratory distress.  Pt diagnosed with acute hypoxic/hypercarbic respiratory failure, acute pulmonary edema, acute CHF exacerbation, and acute on chronic renal failure.  Pt intubated 06/21/17 and extubated 06/24/17.  CRRT initiated 06/21/17 and discontinued 06/23/17, pt was receiving HD via temporary R HD fem cath, since removed.  PMH includes anxiety, CKD, htn, MI, anginal pain, chronic back pain, DM, anterior fusion c-spine, B RCR, and L TKA.    PT Comments    Mr. Christell ConstantMoore was very pleasant and agreeable to work with PT.  Pt puts forth good effort with therapeutic exercises but requires assist with exercises due to generalized BLE weakness and fatigue.  Attempted sit>supine initially with +1 assist (required total assist to achieve partial trunk elevation), and attempted again with +2 assist, requiring total assist. Pt's ability to achieve supine>sit significantly limited by pt's fear of falling OOB which he was unable to overcome despite max encouragement and reassurance that he will not fall.  Pt continued to demonstrate a strong posterior push at his trunk and ultimately was assisted back to supine.  SNF remains most appropriate d/c plan at this time.     Follow Up Recommendations  SNF     Equipment Recommendations  Rolling walker with 5" wheels    Recommendations for Other Services       Precautions / Restrictions Precautions Precautions: Fall;Other (comment) Precaution Comments: Monitor O2 Restrictions Weight  Bearing Restrictions: No    Mobility  Bed Mobility Overal bed mobility: Needs Assistance Bed Mobility: Supine to Sit;Sit to Supine     Supine to sit: Total assist;HOB elevated Sit to supine: Total assist;+2 for physical assistance   General bed mobility comments: Attempted Supine>sit with +1 assist, pt instructed to use bed rail to assist in pulling into sitting but pt unable to assist this PT and requires total assist to achieve partial trunk elevation.  Pt reports a fear of falling OOB and pushes against this PT despite max encouragement and reassurance that this PT will not allow him to fall.  Attempted supine>sit with +2 assist and pt again with posterior pushing at trunk despite max verbal cues and total +2 assist.  Pt was assisted back to supine.   Transfers                 General transfer comment: Unable to attempt  Ambulation/Gait                 Stairs            Wheelchair Mobility    Modified Rankin (Stroke Patients Only)       Balance Overall balance assessment: Needs assistance;History of Falls Sitting-balance support: Bilateral upper extremity supported;Feet supported Sitting balance-Leahy Scale: Zero Sitting balance - Comments: Pt requires total assist in near sitting position due to posterior lean and pushing Postural control: Posterior lean                                  Cognition Arousal/Alertness:  Awake/alert Behavior During Therapy: WFL for tasks assessed/performed Overall Cognitive Status: Within Functional Limits for tasks assessed                                        Exercises Total Joint Exercises Ankle Circles/Pumps: AROM;Both;10 reps;Supine Quad Sets: Strengthening;Both;10 reps;Supine Short Arc Quad: Strengthening;Both;10 reps;Supine Hip ABduction/ADduction: Strengthening;AAROM;Both;10 reps;Supine General Exercises - Lower Extremity Straight Leg Raises: Strengthening;AAROM;Both;10  reps;Supine Other Exercises Other Exercises: Cues and demonstration for pursed lip breathing throughout entire session    General Comments General comments (skin integrity, edema, etc.): SpO2 briefly down to 86% on 3L O2 with bed mobility, otherwise remains at or above 88% on 3L O2 with activity.       Pertinent Vitals/Pain Pain Assessment: Faces Faces Pain Scale: Hurts little more Pain Location: R knee and ankle (due to arthritis() Pain Descriptors / Indicators: Aching;Guarding Pain Intervention(s): Limited activity within patient's tolerance;Monitored during session    Home Living                      Prior Function            PT Goals (current goals can now be found in the care plan section) Acute Rehab PT Goals Patient Stated Goal: to get stronger PT Goal Formulation: With patient Time For Goal Achievement: 07/09/17 Potential to Achieve Goals: Fair Progress towards PT goals: Progressing toward goals(very modestly)    Frequency    Min 2X/week      PT Plan Current plan remains appropriate    Co-evaluation              AM-PAC PT "6 Clicks" Daily Activity  Outcome Measure  Difficulty turning over in bed (including adjusting bedclothes, sheets and blankets)?: Unable Difficulty moving from lying on back to sitting on the side of the bed? : Unable Difficulty sitting down on and standing up from a chair with arms (e.g., wheelchair, bedside commode, etc,.)?: Unable Help needed moving to and from a bed to chair (including a wheelchair)?: Total Help needed walking in hospital room?: Total Help needed climbing 3-5 steps with a railing? : Total 6 Click Score: 6    End of Session Equipment Utilized During Treatment: Oxygen Activity Tolerance: Patient limited by fatigue;Other (comment)(limited by pt's fear of falling) Patient left: in bed;with call bell/phone within reach;with bed alarm set Nurse Communication: Mobility status PT Visit Diagnosis: Other  abnormalities of gait and mobility (R26.89);History of falling (Z91.81);Muscle weakness (generalized) (M62.81)     Time: 1005-1036 PT Time Calculation (min) (ACUTE ONLY): 31 min  Charges:  $Therapeutic Exercise: 8-22 mins $Therapeutic Activity: 8-22 mins                    G Codes:       Encarnacion ChuAshley Abashian PT, DPT 06/29/2017, 10:51 AM

## 2017-06-29 NOTE — Progress Notes (Signed)
MD paged to notify about increased oxygen demands after getting perm catheter placed. New orders received. Will continue to monitor.

## 2017-06-29 NOTE — Progress Notes (Signed)
[facility] [place] [facility]   PATIENT NAME: Christopher [name]    MR#:  [id number]  DATE OF BIRTH:  08/21/1940  SUBJECTIVE:  CHIEF COMPLAINT:   Chief Complaint  Patient presents with  . Shortness of Breath   Admitted for congestive heart failure/pulmonary edema which improved with hemodialysis.  Temporary femoral hemodialysis catheter removed 06/26/2017.    Poor urine output overnight.  Worsening BUN and creatinine.  Continues to feel weak.  REVIEW OF SYSTEMS:   Review of Systems  Constitutional: Negative for fever and malaise/fatigue.  HENT: Negative for congestion, ear discharge and tinnitus.   Eyes: Negative for blurred vision.  Respiratory: Negative for cough, shortness of breath and wheezing.   Cardiovascular: Positive for leg swelling. Negative for chest pain, palpitations and orthopnea.  Gastrointestinal: Negative for abdominal pain, diarrhea, nausea and vomiting.  Genitourinary: Negative for dysuria and urgency.  Skin: Negative for rash.  Neurological: Positive for weakness. Negative for tremors, sensory change and focal weakness.  Psychiatric/Behavioral: Negative for depression and memory loss.    DRUG ALLERGIES:   Allergies  Allergen Reactions  . Neurontin [Gabapentin] Other (See Comments)    Swelling when taking everyday    VITALS:  Blood pressure (!) 157/81, pulse (!) 102, temperature 98.6 F (37 C), temperature source Oral, resp. rate 18, height 5\' 8"  (1.727 m), weight 85.8 kg (189 lb 3.2 oz), SpO2 91 %.  PHYSICAL EXAMINATION:  GENERAL:  76 y.o.-year-old patient lying in the bed with no acute distress.  EYES: Pupils equal, round, reactive to light and accommodation. No scleral icterus. Extraocular muscles intact.  HEENT: Head atraumatic, normocephalic. Oropharynx and nasopharynx clear. NECK:  Supple, no jugular venous distention. No thyroid enlargement, no tenderness.  LUNGS: Normal breath sounds bilaterally, no wheezing, rales,rhonchi or  crepitation. No use of accessory muscles of respiration.  CARDIOVASCULAR: S1, S2 normal. No murmurs, rubs, or gallops.  ABDOMEN: Soft, nontender, nondistended. Bowel sounds present. No organomegaly or mass. Rectal tube and Foley in place. EXTREMITIES: b/l pedal edema,no cyanosis, or clubbing.  NEUROLOGIC: Cranial nerves intact, Moves all 4 limbs spontaneously and follows commands, generalized weakness .  PSYCHIATRIC: The patient is alert and oriented X3.  SKIN: No obvious rash, lesion, or ulcer.   Physical Exam LABORATORY PANEL:   CBC Recent Labs  Lab 06/27/17 0546  WBC 10.8*  HGB 9.7*  HCT 29.8*  PLT 185   ------------------------------------------------------------------------------------------------------------------  Chemistries  Recent Labs  Lab 06/28/17 0442 06/29/17 0542  NA 138 141  K 4.0 4.5  CL 99* 102  CO2 28 26  GLUCOSE 113* 153*  BUN 96* 109*  CREATININE 2.90* 3.18*  CALCIUM 8.5* 8.4*  MG 2.2  --    ------------------------------------------------------------------------------------------------------------------  Cardiac Enzymes No results for input(s): TROPONINI in the last 168 hours. ------------------------------------------------------------------------------------------------------------------  RADIOLOGY:  No results found.  ASSESSMENT AND PLAN:   Active Problems:   Acute renal failure superimposed on stage 4 chronic kidney disease (HCC)   Acute respiratory failure (HCC)   Acute renal failure (HCC)  1.  Acute respiratory failure due to Acute on chronic diastolic CHF Needed full ventilatory support and later extubated.  Fluid overload improved with temporary hemodialysis.  Hemodialysis catheter removed 06/26/2018 On Lasix PO 40 mg bid. Discussed with Dr. Trenton GammonLaeef.  Poor urine output and worsening BUN and creatinine.  Will have tunneled dialysis catheter place and start hemodialysis.  2.  Diabetes mellitus type 2: Continue levemir per home  regimen.  3.  Coronary artery disease: Stable: Continue  aspirin 4.  Hypertension Uncontrolled.  Increased doses of Norvasc and metoprolol. Improving  5.  Right knee pain.  No DVT on ultrasound.  Has right Baker's cyst.  Xray shows arthritis and some effusion. Will start short course of  prednisone  Severe weakness.  Physical therapy evaluation.  SNF at discharge.  All the records are reviewed and case discussed with Care Management/Social Worker Management plans discussed with the patient, family and they are in agreement.  CODE STATUS: Full.  TOTAL TIME TAKING CARE OF THIS PATIENT: 35 minutes.   POSSIBLE D/C IN 1-2 DAYS, DEPENDING ON CLINICAL CONDITION.  Christopher [name] M.D on 06/29/2017   Between 7am to 6pm - Pager - [phone]  After 6pm go to [email or web address] - password [email or web address]  [facility]  Office  [phone]  CC: Primary care physician; System, Provider Not In  Note: This dictation was prepared with Dragon dictation along with smaller phrase technology. Any transcriptional errors that result from this process are unintentional.

## 2017-06-29 NOTE — Progress Notes (Signed)
HD initiated via R Chest HD cath without issue. Hepatitis labs sent. Patient currently sleeping. Denies complaints.

## 2017-06-29 NOTE — Progress Notes (Addendum)
Pt now on nasal cannula . 4L of O2 and sat of  96%. Will continue to monitor.

## 2017-06-29 NOTE — Clinical Social Work Note (Signed)
Clinical Social Work Assessment  Patient Details  Name: Christopher [name] MRN: [id number] Date of Birth: 01/02/1941  Date of referral:  06/29/17               Reason for consult:  Facility Placement                Permission sought to share information with:  Facility Medical sales representativeContact Representative, Family Supports Permission granted to share information::  Yes, Verbal Permission Granted  Name::     Christopher [name],Christopher [name]   [phone]   Agency::  SNF admissions  Relationship::     Contact Information:     Housing/Transportation Living arrangements for the past 2 months:  Skilled Nursing Facility Source of Information:  Patient, [name] Patient Interpreter Needed:  None Criminal Activity/Legal Involvement Pertinent to Current Situation/Hospitalization:  No - Comment as needed Significant Relationships:  Adult Children, [name] Lives with:  [name], Adult Children Do you feel safe going back to the place where you live?  No Need for family participation in patient care:  No (Coment)  Care giving concerns:  Patient and his family feel he needs short term rehab before he is able to return back home.  Social Worker assessment / plan:  Patient is a 76 year old male who is married and lives with his wife and son.  Patient states he has not been to SNF before, CSW explained what to expect at SNF and what the process is for looking for placement.  CSW explained how insurance will pay for stay at SNF.  Patient was informed what the role of CSW is and what to expect for discharge planning from SNF.  Patient and family did not have any other questions or concerns, and gave CSW permission to begin bed search in [place].   Employment status:  Retired Database administratornsurance information:  Managed Medicare PT Recommendations:  Skilled Nursing Facility Information / Referral to community resources:  Skilled Nursing Facility  Patient/Family's Response to care:  Patient and family are agreeable to going to  SNF  Patient/Family's Understanding of and Emotional Response to Diagnosis, Current Treatment, and Prognosis:  Patient and family are [facility]  Emotional Assessment Appearance:  Appears stated age Attitude/Demeanor/Rapport:    Affect (typically observed):  Appropriate, Calm, Pleasant Orientation:  Oriented to Self, Oriented to Place, Oriented to  Time, Oriented to Situation Alcohol / Substance use:  Not Applicable Psych involvement (Current and /or in the community):  No (Comment)  Discharge Needs  Concerns to be addressed:  Lack of Support Readmission within the last 30 days:  No Current discharge risk:  Lack of support system Barriers to Discharge:  Continued Medical Work up, [employer] Authorization   Christopher Constablenterhaus, Christopher [name], LCSWA 06/29/2017, 4:24 PM

## 2017-06-30 ENCOUNTER — Encounter: Payer: Self-pay | Admitting: Vascular Surgery

## 2017-06-30 LAB — BASIC METABOLIC PANEL
ANION GAP: 9 (ref 5–15)
BUN: 92 mg/dL — AB (ref 6–20)
CO2: 29 mmol/L (ref 22–32)
Calcium: 8.2 mg/dL — ABNORMAL LOW (ref 8.9–10.3)
Chloride: 103 mmol/L (ref 101–111)
Creatinine, Ser: 2.59 mg/dL — ABNORMAL HIGH (ref 0.61–1.24)
GFR, EST AFRICAN AMERICAN: 26 mL/min — AB (ref >60)
GFR, EST NON AFRICAN AMERICAN: 23 mL/min — AB (ref >60)
Glucose, Bld: 140 mg/dL — ABNORMAL HIGH (ref 65–99)
Potassium: 4.7 mmol/L (ref 3.5–5.1)
Sodium: 141 mmol/L (ref 135–145)

## 2017-06-30 LAB — GLUCOSE, CAPILLARY
Glucose-Capillary: 126 mg/dL — ABNORMAL HIGH (ref 65–99)
Glucose-Capillary: 123 mg/dL — ABNORMAL HIGH (ref 65–99)
Glucose-Capillary: 262 mg/dL — ABNORMAL HIGH (ref 65–99)
Glucose-Capillary: 222 mg/dL — ABNORMAL HIGH (ref 65–99)

## 2017-06-30 LAB — HEPATITIS B SURFACE ANTIGEN: HEP B S AG: NEGATIVE

## 2017-06-30 LAB — HEPATITIS B CORE ANTIBODY, TOTAL: HEP B C TOTAL AB: NEGATIVE

## 2017-06-30 LAB — PHOSPHORUS: PHOSPHORUS: 4.6 mg/dL (ref 2.5–4.6)

## 2017-06-30 LAB — HEPATITIS B SURFACE ANTIBODY, QUANTITATIVE

## 2017-06-30 MED ORDER — RENA-VITE PO TABS
1.0000 | ORAL_TABLET | Freq: Every day | ORAL | Status: DC
  Administered 2017-06-30 – 2017-07-06 (×7): 1 via ORAL
  Filled 2017-06-30 (×9): qty 1

## 2017-06-30 MED ORDER — NEPRO/CARBSTEADY PO LIQD
237.0000 mL | Freq: Two times a day (BID) | ORAL | Status: DC
  Administered 2017-06-30 – 2017-07-07 (×9): 237 mL via ORAL

## 2017-06-30 MED ORDER — ADULT MULTIVITAMIN W/MINERALS CH
1.0000 | ORAL_TABLET | ORAL | Status: DC
  Administered 2017-07-02 – 2017-07-07 (×2): 1 via ORAL
  Filled 2017-06-30 (×2): qty 1

## 2017-06-30 NOTE — Progress Notes (Signed)
OT Cancellation Note  Patient Details Name: Maralyn SagoRalph T Gellner MRN: [id number] DOB: 07-Feb-1941   Cancelled Treatment:    Reason Eval/Treat Not Completed: Patient at procedure or test/ unavailable. Pt receiving dialysis, unavailable for OT treatment. Will re-attempt next date.  Richrd PrimeJamie Stiller, MPH, MS, OTR/L ascom [phone] 06/30/17, 1:01 PM

## 2017-06-30 NOTE — Progress Notes (Signed)
Pt is having episodes of confusion this AM, calling his wife.MD made aware.

## 2017-06-30 NOTE — Progress Notes (Signed)
Post HD assessment unchanged  

## 2017-06-30 NOTE — Progress Notes (Signed)
Physical Therapy Treatment Patient Details Name: Christopher [name] MRN: [id number] DOB: 15-Jul-1941 Today's Date: 06/30/2017    History of Present Illness Pt is a 76 y.o. male presenting to hospital 06/20/17 with SOB (pt with significant SOB coming back from fishing trip; O2 noted to be 67% and EMS initiated CPAP).  Pt admitted to hospital with acute on chronic kidney disease (fluid overloaded resulting in hypoxia).  Pt transferred to CCU 06/21/17 secondary to unresponsiveness and respiratory distress.  Pt diagnosed with acute hypoxic/hypercarbic respiratory failure, acute pulmonary edema, acute CHF exacerbation, and acute on chronic renal failure.  Pt intubated 06/21/17 and extubated 06/24/17.  CRRT initiated 06/21/17 and discontinued 06/23/17, pt was receiving HD via temporary R HD fem cath, since removed.  PMH includes anxiety, CKD, htn, MI, anginal pain, chronic back pain, DM, anterior fusion c-spine, B RCR, and L TKA.    PT Comments    Pt very functionally limited but motivated to do what he could.  He is feeling weak generally as well as from having dialysis this AM, but ultimately he is very functionally limited, especially when compared to his baseline.  Overall pt was able to do more exercises and tolerate longer sitting with less assist (once situated after max assist to get to sitting EOB and then positioned to be able to sit w/o constant physical assist).  Pt pleasant, though somewhat confused t/o session, but willing to attempt all that was asked of him.    Follow Up Recommendations  SNF     Equipment Recommendations  Rolling walker with 5" wheels    Recommendations for Other Services       Precautions / Restrictions Precautions Precautions: Fall Restrictions Weight Bearing Restrictions: No    Mobility  Bed Mobility Overal bed mobility: Needs Assistance Bed Mobility: Supine to Sit;Sit to Supine     Supine to sit: Max assist Sit to supine: Max assist   General bed  mobility comments: Pt showed great effort with getting to EOB but needed heavy assist and could not use UEs much at all.  Very limited with initially getting situated to even maintain balance at EOB, needing heavy assist to get to maintain balance.  Ultimately once positioned he was able to sit at EOB for >5 minutes w/ only CGA, but was fatigued with the effort.   Transfers                 General transfer comment: unsafe/unable to attempt  Ambulation/Gait                 Stairs            Wheelchair Mobility    Modified Rankin (Stroke Patients Only)       Balance Overall balance assessment: History of Falls;Needs assistance Sitting-balance support: Bilateral upper extremity supported;Feet supported Sitting balance-Leahy Scale: Fair Sitting balance - Comments: unable to maintain balance until positioned at EOB with great effort and assist.  Able to maintain sitting balance with CGA once in position Postural control: Posterior lean                                  Cognition Arousal/Alertness: Awake/alert Behavior During Therapy: WFL for tasks assessed/performed Overall Cognitive Status: Within Functional Limits for tasks assessed  General Comments: slow to respond to questions and appears confused at times but pleasant and willing to participate      Exercises General Exercises - Lower Extremity Ankle Circles/Pumps: Strengthening;10 reps Long Arc Quad: Left;10 reps(R knee ROM limitations preclude this) Heel Slides: Right;10 reps(L ROM limitations preculde this activity) Hip ABduction/ADduction: Strengthening;20 reps Straight Leg Raises: AAROM;10 reps Hip Flexion/Marching: Strengthening;10 reps;AROM    General Comments        Pertinent Vitals/Pain Pain Assessment: (minimal pain at rest, any R knee ROM very pain ful)    Home Living                      Prior Function             PT Goals (current goals can now be found in the care plan section) Progress towards PT goals: Progressing toward goals    Frequency    Min 2X/week      PT Plan Current plan remains appropriate    Co-evaluation              AM-PAC PT "6 Clicks" Daily Activity  Outcome Measure  Difficulty turning over in bed (including adjusting bedclothes, sheets and blankets)?: Unable Difficulty moving from lying on back to sitting on the side of the bed? : Unable Difficulty sitting down on and standing up from a chair with arms (e.g., wheelchair, bedside commode, etc,.)?: Unable Help needed moving to and from a bed to chair (including a wheelchair)?: Total Help needed walking in hospital room?: Total Help needed climbing 3-5 steps with a railing? : Total 6 Click Score: 6    End of Session Equipment Utilized During Treatment: Oxygen Activity Tolerance: Patient limited by fatigue;Other (comment) Patient left: in bed;with call bell/phone within reach;with bed alarm set   PT Visit Diagnosis: Other abnormalities of gait and mobility (R26.89);History of falling (Z91.81);Muscle weakness (generalized) (M62.81)     Time: [id number] PT Time Calculation (min) (ACUTE ONLY): 32 min  Charges:  $Therapeutic Exercise: 8-22 mins $Therapeutic Activity: 8-22 mins                    G Codes:       Christopher [name], DPT 06/30/2017, 5:27 PM

## 2017-06-30 NOTE — Progress Notes (Signed)
Pre HD  

## 2017-06-30 NOTE — Progress Notes (Signed)
To dialysis via bed.

## 2017-06-30 NOTE — Progress Notes (Signed)
TB vaccine administered on left arm at 0400 06/30/2017

## 2017-06-30 NOTE — Progress Notes (Signed)
Nutrition Follow-up  DOCUMENTATION CODES:   Obesity unspecified  INTERVENTION:  Recommend check vitamin D labs   Discontinue Premier Protein  Add Nepro Shake po BID, each supplement provides 425 kcal and 19 grams protein  MVI twice weekly  Renal MVI daily   NUTRITION DIAGNOSIS:   Inadequate oral intake related to decreased appetite as evidenced by per patient/family report.  -improving  GOAL:   Patient will meet greater than or equal to 90% of their needs  Progressing.   MONITOR:   PO intake, Supplement acceptance, Labs, Weight trends, I & O's  ASSESSMENT:   76 year old male with PMHx of HTN, anxiety, CAD, hx MI 2003, HLD, DM type 2, chronic back pain, chronic kidney disease stage IV, hx partial nephrectomy in 2000 who presented with shortness of breath found to have acute on chronic kidney disease with volume overload resulting in hypoxia. Rapid response was called 11/11 due to low oxygen saturation and decreased responsiveness and was emergently intubated.   Pt's appetite improving; eating 50-100% of meals and drinking Premier Protein. Pt will elevated Phosphorus; RD will switch pt to Nepro supplement. If pt does not like Nepro, can switch back to Christopher [name]. Pt with ESRD and will continue with HD. Pt with RIJ cath placed 11/19 and s/p HD 11/19. Will add renal MVI daily. Per chart, pt with 34lb weight loss since admit r/t fluid changes. Pt now 188lbs which is about 20lbs less than pt's UBW; RD is unsure if pt has lost any true weight. RD will continue to monitor oral intake; can liberalize diet if needed as pt with now be on HD. Recommend check vitamin D labs as maintaining proper vitamin D levels will suppress PTH production and promote good bone health.         Medications reviewed and include: allopurinol, aspirin, lasix, heparin, insulin, Omega 3, KCl, prednisone  Labs reviewed: BUN 92(H), creat 2.59(H), Ca 8.2(L) P 5.9(H), Mg 2.2 wnl BNP- 404(H)- 11/10 WBC- 10.8(H),  hgb 9.7(L), Hct 29.8(L) cbgs- 113, 153, 140 x 48hrs AIC 7.3(H)- 11/11  Diet Order:  Diet renal/carb modified with fluid restriction Diet-HS Snack? Nothing; Room service appropriate? Yes; Fluid consistency: Thin  EDUCATION NEEDS:   No education needs have been identified at this time  Skin:  Reviewed RN Assessment(abrasions to legs and knees)  Last BM:  11/20- type 7  Height:   Ht Readings from Last 1 Encounters:  06/29/17 5\' 8"  (1.727 m)    Weight:   Wt Readings from Last 1 Encounters:  06/30/17 188 lb (85.3 kg)    Ideal Body Weight:  70 kg  BMI:  Body mass index is 28.59 kg/m.  Estimated Nutritional Needs:   Kcal:  2075-2250 (MSJ x 1.2-1.3)  Protein:  100-120 grams (1-1.2 grams/kg)  Fluid:  1.7-2.1 L/day (25-30 mL/kg IBW)  Christopher Holidayasey Jhaden Pizzuto MS, RD, LDN Pager #- [phone] After Hours Pager: [phone]

## 2017-06-30 NOTE — Plan of Care (Signed)
  Progressing Education: Knowledge of General Education information will improve 06/30/2017 1253 - Progressing by Donald ProseBerry, Trigo Winterbottom L, RN Clinical Measurements: Will remain free from infection 06/30/2017 1253 - Progressing by Donald ProseBerry, Capri Veals L, RN Note Patient remains free of infection Safety: Ability to remain free from injury will improve 06/30/2017 1253 - Progressing by Donald ProseBerry, Lyrik Dockstader L, RN Note Fall precautions in place, non skid socks when oob Cardiac: Ability to achieve and maintain adequate cardiopulmonary perfusion will improve 06/30/2017 1253 - Progressing by Donald ProseBerry, Juston Goheen L, RN Note PO Lasix, daily weights, intake and output   Not Progressing Activity: Risk for activity intolerance will decrease 06/30/2017 1253 - Not Progressing by Donald ProseBerry, Mckynlee Luse L, RN Note Pt appears weak/deconditioned, PT working with pt

## 2017-06-30 NOTE — Progress Notes (Signed)
Treatment completed. Patient tolerated well. Goal met.

## 2017-06-30 NOTE — Progress Notes (Signed)
[employer] [facility] [place] [facility]   PATIENT NAME: Christopher GambleRalph Pomales    MR#:  [id number]  DATE OF BIRTH:  05/08/1941  SUBJECTIVE:  CHIEF COMPLAINT:   Chief Complaint  Patient presents with  . Shortness of Breath   Admitted for congestive heart failure/pulmonary edema which improved with hemodialysis.  Temporary femoral hemodialysis catheter removed 06/26/2017.    Poor urine output and SOB. Some confusion overnight and now better  Wife and son in room  REVIEW OF SYSTEMS:   Review of Systems  Constitutional: Negative for fever and malaise/fatigue.  HENT: Negative for congestion, ear discharge and tinnitus.   Eyes: Negative for blurred vision.  Respiratory: Negative for cough, shortness of breath and wheezing.   Cardiovascular: Positive for leg swelling. Negative for chest pain, palpitations and orthopnea.  Gastrointestinal: Negative for abdominal pain, diarrhea, nausea and vomiting.  Genitourinary: Negative for dysuria and urgency.  Skin: Negative for rash.  Neurological: Positive for weakness. Negative for tremors, sensory change and focal weakness.  Psychiatric/Behavioral: Negative for depression and memory loss.    DRUG ALLERGIES:   Allergies  Allergen Reactions  . Neurontin [Gabapentin] Other (See Comments)    Swelling when taking everyday    VITALS:  Blood pressure (!) 124/98, pulse 94, temperature 98 F (36.7 C), temperature source Oral, resp. rate (!) 24, height 5\' 8"  (1.727 m), weight 85.3 kg (188 lb), SpO2 94 %.  PHYSICAL EXAMINATION:  GENERAL:  76 y.o.-year-old patient lying in the bed with no acute distress.  EYES: Pupils equal, round, reactive to light and accommodation. No scleral icterus. Extraocular muscles intact.  HEENT: Head atraumatic, normocephalic. Oropharynx and nasopharynx clear. NECK:  Supple, no jugular venous distention. No thyroid enlargement, no tenderness.  LUNGS: Normal breath sounds bilaterally, no wheezing, rales,rhonchi or  crepitation. No use of accessory muscles of respiration.  CARDIOVASCULAR: S1, S2 normal. No murmurs, rubs, or gallops.  ABDOMEN: Soft, nontender, nondistended. Bowel sounds present. No organomegaly or mass. Rectal tube and Foley in place. EXTREMITIES: b/l pedal edema,no cyanosis, or clubbing.  NEUROLOGIC: Cranial nerves intact, Moves all 4 limbs spontaneously and follows commands, generalized weakness .  PSYCHIATRIC: The patient is alert and oriented X3.  SKIN: No obvious rash, lesion, or ulcer.   Physical Exam LABORATORY PANEL:   CBC Recent Labs  Lab 06/27/17 0546  WBC 10.8*  HGB 9.7*  HCT 29.8*  PLT 185   ------------------------------------------------------------------------------------------------------------------  Chemistries  Recent Labs  Lab 06/28/17 0442  06/30/17 0354  NA 138   < > 141  K 4.0   < > 4.7  CL 99*   < > 103  CO2 28   < > 29  GLUCOSE 113*   < > 140*  BUN 96*   < > 92*  CREATININE 2.90*   < > 2.59*  CALCIUM 8.5*   < > 8.2*  MG 2.2  --   --    < > = values in this interval not displayed.   ------------------------------------------------------------------------------------------------------------------  Cardiac Enzymes No results for input(s): TROPONINI in the last 168 hours. ------------------------------------------------------------------------------------------------------------------  RADIOLOGY:  No results found.  ASSESSMENT AND PLAN:   Active Problems:   Acute renal failure superimposed on stage 4 chronic kidney disease (HCC)   Acute respiratory failure (HCC)   Acute renal failure (HCC)  1.  Acute respiratory failure due to Acute on chronic diastolic CHF Needed full ventilatory support and later extubated.  Fluid overload improved with temporary hemodialysis.  Hemodialysis catheter removed 06/26/2018 Tunneled HD  cathter placed 06/29/2017 and resumed on HD.  2.  Diabetes mellitus type 2: Continue levemir per home regimen.  3.   Coronary artery disease: Stable: Continue aspirin 4.  Hypertension Uncontrolled.  Increased doses of Norvasc and metoprolol. Improving  5.  Right knee pain.  No DVT on ultrasound.  Has right Baker's cyst.  Xray shows arthritis and some effusion. On  prednisone  Severe weakness.  Physical therapy evaluation.  SNF at discharge.  All the records are reviewed and case discussed with Care Management/Social Worker Management plans discussed with the patient, family and they are in agreement.  CODE STATUS: Full.  TOTAL TIME TAKING CARE OF THIS PATIENT: 35 minutes.   POSSIBLE D/C IN 1-2 DAYS, DEPENDING ON CLINICAL CONDITION.  Orie FishermanSrikar R Valerye Kobus M.D on 06/30/2017   Between 7am to 6pm - Pager - [phone]  After 6pm go to [email or web address] - password [email or web address]  [facility] [place] [facility]  Office  [phone]  CC: Primary care physician; System, Provider Not In  Note: This dictation was prepared with Dragon dictation along with smaller phrase technology. Any transcriptional errors that result from this process are unintentional.

## 2017-06-30 NOTE — Progress Notes (Signed)
HD initiated via R chest HD cath. No heparin treatment. Phos sent per order.

## 2017-06-30 NOTE — Progress Notes (Signed)
[facility], [place] 06/30/17  Subjective:  Patient seen and evaluated during hemodialysis. Urine output was increased to 1.7 L over the preceding 24 hours.   Objective:  Vital signs in last 24 hours:  Temp:  [97.4 F (36.3 C)-98.2 F (36.8 C)] 98 F (36.7 C) (11/20 0827) Pulse Rate:  [75-100] 93 (11/20 1130) Resp:  [14-26] 18 (11/20 1130) BP: (106-158)/(60-93) 113/66 (11/20 1130) SpO2:  [81 %-98 %] 95 % (11/20 1130) FiO2 (%):  [35 %-50 %] 35 % (11/19 1449) Weight:  [85.3 kg (188 lb)-86.8 kg (191 lb 5.8 oz)] 85.3 kg (188 lb) (11/20 0352)  Weight change: -0.091 kg (-3.2 oz) Filed Weights   06/29/17 1229 06/29/17 1715 06/30/17 0352  Weight: 85.7 kg (189 lb) 86.8 kg (191 lb 5.8 oz) 85.3 kg (188 lb)    Intake/Output:    Intake/Output Summary (Last 24 hours) at 06/30/2017 1155 Last data filed at 06/30/2017 0352 Gross per 24 hour  Intake 360 ml  Output 1464 ml  Net -1104 ml     Physical Exam: General:  Chronically ill-appearing  HEENT  nasal cannula oxygen, hearing intact, OM moist  Neck  supple  Pulm/lungs  coarse breath sounds bilaterally  CVS/Heart  S1S2 no rubs  Abdomen:   Soft NTND BS present  Extremities: + Generalized dependent edema  Neurologic:   Awake, alert, follows commands             Basic Metabolic Panel:  Recent Labs  Lab 06/23/17 2354 06/24/17 0430 06/24/17 0432 06/24/17 0756  06/26/17 0436 06/27/17 0546 06/28/17 0442 06/29/17 0542 06/30/17 0354 06/30/17 0919  NA 138 139 139 138   < > 140 140 138 141 141  --   K 4.1 3.9 3.9 3.7   < > 3.7 3.9 4.0 4.5 4.7  --   CL 105 106 106 103   < > 102 100* 99* 102 103  --   CO2 23 21* 25 24   < > _0 --   GLUCOSE 196* 192* 195* 187*   < > 111* 123* 113* 153* 140*  --   BUN 66* 73* 73* 75*   < > 75* 80* 96* 109* 92*  --   CREATININE 2.45* 2.61* 2.45* 2.65*   < > 3.03* 2.94* 2.90* 3.18* 2.59*  --   CALCIUM 7.9* 7.7* 7.7* 7.8*   < > 8.2* 8.4* 8.5* 8.4* 8.2*   --   MG 2.0  --  2.0 2.0  --  2.0  --  2.2  --   --   --   PHOS 4.9* 5.1*  --  5.3*  --  4.6  --  5.9*  --   --  4.6   < > = values in this interval not displayed.     CBC: Recent Labs  Lab 06/24/17 0432 06/25/17 0441 06/26/17 0436 06/27/17 0546  WBC 8.6 9.8 10.2 10.8*  HGB 8.2* 8.7* 8.8* 9.7*  HCT 25.2* 27.0* 26.6* 29.8*  MCV 96.5 96.2 98.8 96.7  PLT 48* 116* 145* 185      Lab Results  Component Value Date   HEPBSAG Negative 06/29/2017      Microbiology:  Recent Results (from the past 240 hour(s))  MRSA PCR Screening     Status: None   Collection Time: 06/21/17  5:00 AM  Result Value Ref Range Status   MRSA by PCR NEGATIVE NEGATIVE Final    Comment:  The GeneXpert MRSA Assay (FDA approved for NASAL specimens only), is one component of a comprehensive MRSA colonization surveillance program. It is not intended to diagnose MRSA infection nor to guide or monitor treatment for MRSA infections.   Culture, blood (Routine X 2) w Reflex to ID Panel     Status: None   Collection Time: 06/21/17  8:00 AM  Result Value Ref Range Status   Specimen Description BLOOD RIGHT ANTECUBITAL  Final   Special Requests   Final    BOTTLES DRAWN AEROBIC AND ANAEROBIC Blood Culture results may not be optimal due to an excessive volume of blood received in culture bottles   Culture NO GROWTH 5 DAYS  Final   Report Status 06/26/2017 FINAL  Final  Culture, blood (Routine X 2) w Reflex to ID Panel     Status: None   Collection Time: 06/21/17  8:04 AM  Result Value Ref Range Status   Specimen Description BLOOD BLOOD RIGHT FOREARM  Final   Special Requests   Final    BOTTLES DRAWN AEROBIC AND ANAEROBIC Blood Culture results may not be optimal due to an excessive volume of blood received in culture bottles   Culture NO GROWTH 5 DAYS  Final   Report Status 06/26/2017 FINAL  Final    Coagulation Studies: No results for input(s): LABPROT, INR in the last 72  hours.  Urinalysis: No results for input(s): COLORURINE, LABSPEC, PHURINE, GLUCOSEU, HGBUR, BILIRUBINUR, KETONESUR, PROTEINUR, UROBILINOGEN, NITRITE, LEUKOCYTESUR in the last 72 hours.  Invalid input(s): APPERANCEUR    Imaging: No results found.   Medications:    . allopurinol  100 mg Oral Daily  . amitriptyline  10 mg Oral QHS  . amLODipine  10 mg Oral Daily  . aspirin  81 mg Per Tube Daily  . feeding supplement (NEPRO CARB STEADY)  237 mL Oral BID BM  . furosemide  40 mg Oral BID  . heparin subcutaneous  5,000 Units Subcutaneous Q8H  . insulin aspart  0-15 Units Subcutaneous TID WC  . insulin aspart  0-5 Units Subcutaneous QHS  . insulin detemir  10 Units Subcutaneous BID AC & HS  . mouth rinse  15 mL Mouth Rinse q12n4p  . metoprolol tartrate  50 mg Oral BID  . multivitamin  1 tablet Oral QHS  . [START ON 07/02/2017] multivitamin with minerals  1 tablet Oral Once per day on Mon Thu  . omega-3 acid ethyl esters  1 g Oral Daily  . potassium chloride  20 mEq Oral Daily  . predniSONE  50 mg Oral Q breakfast  . sodium chloride flush  3 mL Intravenous Q12H  . tuberculin  5 Units Intradermal Once   acetaminophen, HYDROcodone-acetaminophen, ipratropium-albuterol, [DISCONTINUED] ondansetron **OR** ondansetron (ZOFRAN) IV  Assessment/ Plan:  76 y.o. caucasian male with chronic kidney disease stage IV baseline creatinine 2.5-3, coronary artery disease, hypertension, hyperlipidemia, left partial nephrectomy, gout, diabetes mellitus type II insulin dependent, diabetic peripheral neuropathy and osteoarthritis, who was admitted to [facility] on 06/20/2017 for SOB (shortness of breath) [R06.02] Hypoxia [R09.02] Acute renal failure, unspecified acute renal failure type ([place]) [N17  1.  Acute renal failure, dialysis reinitiated 06/29/17.  2.  Hyperkalemia 3.  Chronic kidney stage IV 4.  Diabetes type 2 with chronic kidney disease 5.  Acute respiratory failure, requiring ventilator support, now  on nasal cannula oxygen 6.  Generalized edema  -Renal function has improved as urine output was increased to 1.7 L over the preceding 24 hours.  However BUN  and creatinine remain high.  Dialysis was reinitiated yesterday.  His baseline creatinine appears to be 3.36 with an EGFR of 17.  Unclear as to whether he will regain enough kidney function to come off of dialysis.  He does follow with a nephrologist at [facility].  We may need to be in communication with him.     LOS: [place], Ladine Kiper 11/20/201811:55 AM  [facility] [place], [place]

## 2017-07-01 LAB — BASIC METABOLIC PANEL
Anion gap: 13 (ref 5–15)
BUN: 76 mg/dL — ABNORMAL HIGH (ref 6–20)
CO2: 28 mmol/L (ref 22–32)
Calcium: 8.6 mg/dL — ABNORMAL LOW (ref 8.9–10.3)
Chloride: 99 mmol/L — ABNORMAL LOW (ref 101–111)
Creatinine, Ser: 2.41 mg/dL — ABNORMAL HIGH (ref 0.61–1.24)
GFR calc Af Amer: 29 mL/min — ABNORMAL LOW (ref >60)
GFR calc non Af Amer: 25 mL/min — ABNORMAL LOW (ref >60)
Glucose, Bld: 108 mg/dL — ABNORMAL HIGH (ref 65–99)
Potassium: 4 mmol/L (ref 3.5–5.1)
Sodium: 140 mmol/L (ref 135–145)

## 2017-07-01 LAB — GLUCOSE, CAPILLARY
Glucose-Capillary: 130 mg/dL — ABNORMAL HIGH (ref 65–99)
Glucose-Capillary: 281 mg/dL — ABNORMAL HIGH (ref 65–99)
Glucose-Capillary: 246 mg/dL — ABNORMAL HIGH (ref 65–99)

## 2017-07-01 NOTE — Plan of Care (Signed)
  Progressing Nutrition: Adequate nutrition will be maintained 07/01/2017 1243 - Progressing by Weldon PickingAlejo Calderon, Manuella GhaziBerenice, RN Elimination: Will not experience complications related to urinary retention 07/01/2017 1243 - Progressing by Weldon PickingAlejo Calderon, Manuella GhaziBerenice, RN Safety: Ability to remain free from injury will improve 07/01/2017 1243 - Progressing by Weldon PickingAlejo Calderon, Manuella GhaziBerenice, RN Skin Integrity: Risk for impaired skin integrity will decrease 07/01/2017 1243 - Progressing by Erma HeritageAlejo Calderon, Seif Teichert, RN

## 2017-07-01 NOTE — Progress Notes (Signed)
POST HD 

## 2017-07-01 NOTE — Progress Notes (Signed)
Patient very confused and agitated this morning. Patient does not believe that he is in the hospital, and is refusing all blood work. attempted to talk to patient, but patient unwilling to listen this RN. Will attempt later.

## 2017-07-01 NOTE — Plan of Care (Signed)
  Education: Knowledge of General Education information will improve 07/01/2017 0116 - Progressing by Dorna LeitzNesbitt, Lilyahna Sirmon M, RN

## 2017-07-01 NOTE — Progress Notes (Signed)
PRE HD   

## 2017-07-01 NOTE — Progress Notes (Signed)
[facility] [place], [place] 07/01/17  Subjective:  Patient seen and evaluated during hemodialysis. Appears to be tolerating well. Urine output was only 300 cc over the preceding 24 hours.   Objective:  Vital signs in last 24 hours:  Temp:  [97.5 F (36.4 C)-98 F (36.7 C)] 97.8 F (36.6 C) (11/21 1015) Pulse Rate:  [87-102] 92 (11/21 1100) Resp:  [15-24] 22 (11/21 1100) BP: (107-143)/(61-98) 129/67 (11/21 1100) SpO2:  [93 %-97 %] 95 % (11/21 1012) Weight:  [84 kg (185 lb 3 oz)-84.3 kg (185 lb 13.6 oz)] 84 kg (185 lb 3 oz) (11/21 1012)  Weight change: -1.43 kg (-2.4 oz) Filed Weights   06/30/17 1329 07/01/17 0500 07/01/17 1012  Weight: 84.3 kg (185 lb 13.6 oz) 84 kg (185 lb 3.2 oz) 84 kg (185 lb 3 oz)    Intake/Output:    Intake/Output Summary (Last 24 hours) at 07/01/2017 1129 Last data filed at 06/30/2017 2344 Gross per 24 hour  Intake 6 ml  Output 1300 ml  Net -1294 ml     Physical Exam: General:  Chronically ill-appearing  HEENT  nasal cannula oxygen, hearing intact, OM moist  Neck  supple  Pulm/lungs  coarse breath sounds bilaterally  CVS/Heart  S1S2 no rubs  Abdomen:   Soft NTND BS present  Extremities: + Generalized dependent edema  Neurologic:   Awake, alert, follows commands             Basic Metabolic Panel:  Recent Labs  Lab 06/26/17 0436 06/27/17 0546 06/28/17 0442 06/29/17 0542 06/30/17 0354 06/30/17 0919  NA 140 140 138 141 141  --   K 3.7 3.9 4.0 4.5 4.7  --   CL 102 100* 99* 102 103  --   CO2 26 25 28 26 29   --   GLUCOSE 111* 123* 113* 153* 140*  --   BUN 75* 80* 96* 109* 92*  --   CREATININE 3.03* 2.94* 2.90* 3.18* 2.59*  --   CALCIUM 8.2* 8.4* 8.5* 8.4* 8.2*  --   MG 2.0  --  2.2  --   --   --   PHOS 4.6  --  5.9*  --   --  4.6     CBC: Recent Labs  Lab 06/25/17 0441 06/26/17 0436 06/27/17 0546  WBC 9.8 10.2 10.8*  HGB 8.7* 8.8* 9.7*  HCT 27.0* 26.6* 29.8*  MCV 96.2 98.8 96.7  PLT 116* 145* 185       Lab Results  Component Value Date   HEPBSAG Negative 06/29/2017      Microbiology:  No results found for this or any previous visit (from the past 240 hour(s)).  Coagulation Studies: No results for input(s): LABPROT, INR in the last 72 hours.  Urinalysis: No results for input(s): COLORURINE, LABSPEC, PHURINE, GLUCOSEU, HGBUR, BILIRUBINUR, KETONESUR, PROTEINUR, UROBILINOGEN, NITRITE, LEUKOCYTESUR in the last 72 hours.  Invalid input(s): APPERANCEUR    Imaging: No results found.   Medications:    . allopurinol  100 mg Oral Daily  . amitriptyline  10 mg Oral QHS  . amLODipine  10 mg Oral Daily  . aspirin  81 mg Per Tube Daily  . feeding supplement (NEPRO CARB STEADY)  237 mL Oral BID BM  . furosemide  40 mg Oral BID  . heparin subcutaneous  5,000 Units Subcutaneous Q8H  . insulin aspart  0-15 Units Subcutaneous TID WC  . insulin aspart  0-5 Units Subcutaneous QHS  . insulin detemir  10 Units  Subcutaneous BID AC & HS  . mouth rinse  15 mL Mouth Rinse q12n4p  . metoprolol tartrate  50 mg Oral BID  . multivitamin  1 tablet Oral QHS  . [START ON 07/02/2017] multivitamin with minerals  1 tablet Oral Once per day on Mon Thu  . omega-3 acid ethyl esters  1 g Oral Daily  . sodium chloride flush  3 mL Intravenous Q12H  . tuberculin  5 Units Intradermal Once   acetaminophen, HYDROcodone-acetaminophen, ipratropium-albuterol, [DISCONTINUED] ondansetron **OR** ondansetron (ZOFRAN) IV  Assessment/ Plan:  76 y.o. caucasian male with chronic kidney disease stage IV baseline creatinine 2.5-3, coronary artery disease, hypertension, hyperlipidemia, left partial nephrectomy, gout, diabetes mellitus type II insulin dependent, diabetic peripheral neuropathy and osteoarthritis, who was [facility] to [facility] on 06/20/2017 for SOB (shortness of breath) [R06.02] Hypoxia [R09.02] Acute renal failure, unspecified acute renal failure type (HCC) [N17  1.  Acute renal failure, dialysis  reinitiated 06/29/17.  2.  Hyperkalemia 3.  Chronic kidney stage IV 4.  Diabetes type 2 with chronic kidney disease 5.  Acute respiratory failure, previously requiring ventilator support, now on nasal cannula oxygen 6.  Generalized edema  -Urine output was less yesterday at 300 cc per 24 hours.  Unclear if this is fully accurate.  Patient seen and evaluated during hemodialysis today and appears to be tolerating quite well.  I did discuss care of the patient with his outpatient nephrologist Dr. Bland SpanHadley.  Their group will be happy to resume the patient's care upon discharge.  We will plan to complete dialysis today and will schedule dialysis for Friday again.   LOS: 11 Christopher [name] 11/21/201811:29 AM  [address] [facility] [place], [place] [phone]

## 2017-07-01 NOTE — Progress Notes (Signed)
OT Cancellation Note  Patient Details Name: Maralyn SagoRalph T Levett MRN: [id number] DOB: 05-18-1941   Cancelled Treatment:    Reason Eval/Treat Not Completed: Fatigue/lethargy limiting ability to participate.  Attempted to see patient but he declined due to fatigue. Will attempt again on 07-03-17.  Susanne BordersSusan Wofford, OTR/L ascom [phone] 07/01/17, 3:19 PM

## 2017-07-01 NOTE — Progress Notes (Signed)
[employer] - [place] at [facility]   PATIENT NAME: Christopher [name]    MR#:  [id number]  DATE OF BIRTH:  [birth year]  SUBJECTIVE:  CHIEF COMPLAINT:   Chief Complaint  Patient presents with  . Shortness of Breath   Admitted for congestive heart failure/pulmonary edema which improved with hemodialysis.  Temporary femoral hemodialysis catheter removed 06/26/2017.    Poor urine output and SOB. Some confusion overnight and now better  Christopher [name] and Christopher [name] in room  REVIEW OF SYSTEMS:   Review of Systems  Constitutional: Negative for fever and malaise/fatigue.  HENT: Negative for congestion, ear discharge and tinnitus.   Eyes: Negative for blurred vision.  Respiratory: Negative for cough, shortness of breath and wheezing.   Cardiovascular: Positive for leg swelling. Negative for chest pain, palpitations and orthopnea.  Gastrointestinal: Negative for abdominal pain, diarrhea, nausea and vomiting.  Genitourinary: Negative for dysuria and urgency.  Skin: Negative for rash.  Neurological: Positive for weakness. Negative for tremors, sensory change and focal weakness.  Psychiatric/Behavioral: Negative for depression and memory loss.    DRUG ALLERGIES:   Allergies  Allergen Reactions  . Neurontin [Gabapentin] Other (See Comments)    Swelling when taking everyday    VITALS:  Blood pressure 122/70, pulse 89, temperature 97.6 F (36.4 C), temperature source Oral, resp. rate 20, height 5\' 8"  (1.727 m), weight 84 kg (185 lb 3.2 oz), SpO2 95 %.  PHYSICAL EXAMINATION:  GENERAL:  76 y.o.-year-old patient lying in the bed with no acute distress.  EYES: Pupils equal, round, reactive to light and accommodation. No scleral icterus. Extraocular muscles intact.  HEENT: Head atraumatic, normocephalic. Oropharynx and nasopharynx clear. NECK:  Supple, no jugular venous distention. No thyroid enlargement, no tenderness.  LUNGS: Normal breath sounds bilaterally, no wheezing, rales,rhonchi or  crepitation. No use of accessory muscles of respiration.  CARDIOVASCULAR: S1, S2 normal. No murmurs, rubs, or gallops.  ABDOMEN: Soft, nontender, nondistended. Bowel sounds present. No organomegaly or mass. Rectal tube and Foley in place. EXTREMITIES: b/l pedal edema,no cyanosis, or clubbing.  NEUROLOGIC: Cranial nerves intact, Moves all 4 limbs spontaneously and follows commands, generalized weakness. PSYCHIATRIC: The patient is alert and awake. SKIN: No obvious rash, lesion, or ulcer.   Physical Exam LABORATORY PANEL:   CBC Recent Labs  Lab 06/27/17 0546  WBC 10.8*  HGB 9.7*  HCT 29.8*  PLT 185   ------------------------------------------------------------------------------------------------------------------  Chemistries  Recent Labs  Lab 06/28/17 0442  06/30/17 0354  NA 138   < > 141  K 4.0   < > 4.7  CL 99*   < > 103  CO2 28   < > 29  GLUCOSE 113*   < > 140*  BUN 96*   < > 92*  CREATININE 2.90*   < > 2.59*  CALCIUM 8.5*   < > 8.2*  MG 2.2  --   --    < > = values in this interval not displayed.   ------------------------------------------------------------------------------------------------------------------  Cardiac Enzymes No results for input(s): TROPONINI in the last 168 hours. ------------------------------------------------------------------------------------------------------------------  RADIOLOGY:  No results found.  ASSESSMENT AND PLAN:   Active Problems:   Acute renal failure superimposed on stage 4 chronic kidney disease (HCC)   Acute respiratory failure (HCC)   Acute renal failure (HCC)  1.  Acute respiratory failure due to Acute on chronic diastolic CHF Needed full ventilatory support and later extubated.  Fluid overload improved with temporary hemodialysis.  Hemodialysis catheter removed 06/26/2018 Tunneled HD cathter placed 06/29/2017 and  resumed on HD.  2.  Diabetes mellitus type 2: Continue levemir per home regimen.  Stopped  steroids 3.  Coronary artery disease: Stable: Continue aspirin 4.  Hypertension Uncontrolled.  Increased doses of Norvasc and metoprolol. Improving  5.  Right knee pain.  No DVT on ultrasound.  Has right Baker's cyst.  Xray shows arthritis and some effusion. Had 3 days of  Prednisone  6. Acute inpatient delirium Monitor  Severe weakness.  Physical therapy evaluation.  SNF at discharge.  All the records are reviewed and case discussed with Care Management/Social Worker Management plans discussed with the patient, family and they are in agreement.  CODE STATUS: Full.  TOTAL TIME TAKING CARE OF THIS PATIENT: 35 minutes.   POSSIBLE D/C IN 1-2 DAYS, DEPENDING ON CLINICAL CONDITION.  [name] M.D on 07/01/2017   Between 7am to 6pm - Pager - [phone]  After 6pm go to [email or web address] - password [email or web address]  [facility] [place] [facility]  Office  [phone]  CC: Primary care physician; System, Provider Not In  Note: This dictation was prepared with Dragon dictation along with smaller phrase technology. Any transcriptional errors that result from this process are unintentional.

## 2017-07-01 NOTE — Care Management (Signed)
Discussed the need to have TB skin read today and informed it was to be read 7/22. TB skin test ordered 02/26/2017  around 5:30 pm not placed until 02/27/2017 at 4AM.  Requested results be documented in flow sheet.   Results for Christopher [name], Gurkaran T (MRN [id number]) as of 07/01/2017 15:17  Ref. Range 06/29/2017 16:06 06/29/2017 17:53  Hepatitis B Surface Ag Latest Ref Range: Negative  Negative   Hep B Core Ab, Tot Latest Ref Range: Negative   Negative  Hepatitis B-Post Latest Ref Range: Immunity>9.9 mIU/mL <3.1 (L)

## 2017-07-01 NOTE — Progress Notes (Signed)
OT Cancellation Note  Patient Details Name: Christopher SagoRalph T Huq MRN: [id number] DOB: 19-Aug-1940   Cancelled Treatment:    Reason Eval/Treat Not Completed: Patient at procedure or test/ unavailable. Pt receiving dialysis this morning. Will re-attempt OT treatment in afternoon as pt is available and as schedule permits.  Richrd PrimeJamie Stiller, MPH, MS, OTR/L ascom [phone] 07/01/17, 10:55 AM

## 2017-07-01 NOTE — Clinical Social Work Note (Signed)
CSW spoke to patient and his family to inform them that the SNF they chose only has one bed available right now.  CSW explained to them that if they want to hold the bed they will have to pay $260 a day, family said they can not afford it.  CSW told them once patient gets closer to discharge, CSW will check with [facility] to see if they have any beds available, also CSW will have to pursue insurance authorization through [facility].  CSW told the family that if [facility] does not have any beds available they will have to consider other facilities, CSW did tell them, if they have to go to a different SNF first, and then if [facility] has a bed available at a later time, they can look at transferring SNFs pending insurance approval.  CSW continuing to follow patient's progress throughout discharge planning, still awaiting TB results and dialysis placement.  CSW updated Zella BallRobin in admissions at [facility] [phone].  Ervin KnackEric R. Reisha Wos, MSW, Theresia MajorsLCSWA [phone]  07/01/2017 1:08 PM

## 2017-07-01 NOTE — Progress Notes (Signed)
HD TX STARTED 

## 2017-07-02 LAB — BASIC METABOLIC PANEL
ANION GAP: 9 (ref 5–15)
BUN: 68 mg/dL — ABNORMAL HIGH (ref 6–20)
CHLORIDE: 100 mmol/L — AB (ref 101–111)
CO2: 31 mmol/L (ref 22–32)
Calcium: 8.5 mg/dL — ABNORMAL LOW (ref 8.9–10.3)
Creatinine, Ser: 2.33 mg/dL — ABNORMAL HIGH (ref 0.61–1.24)
GFR calc non Af Amer: 26 mL/min — ABNORMAL LOW (ref >60)
GFR, EST AFRICAN AMERICAN: 30 mL/min — AB (ref >60)
Glucose, Bld: 164 mg/dL — ABNORMAL HIGH (ref 65–99)
POTASSIUM: 4.2 mmol/L (ref 3.5–5.1)
Sodium: 140 mmol/L (ref 135–145)

## 2017-07-02 LAB — GLUCOSE, CAPILLARY
Glucose-Capillary: 120 mg/dL — ABNORMAL HIGH (ref 65–99)
Glucose-Capillary: 164 mg/dL — ABNORMAL HIGH (ref 65–99)
Glucose-Capillary: 174 mg/dL — ABNORMAL HIGH (ref 65–99)
Glucose-Capillary: 168 mg/dL — ABNORMAL HIGH (ref 65–99)

## 2017-07-02 NOTE — Progress Notes (Signed)
PPD test read on Left arm to be negative.

## 2017-07-02 NOTE — Progress Notes (Signed)
[facility] [place] [facility]   PATIENT NAME: Christopher [name]    MR#:  [id number]  DATE OF BIRTH:  1941/01/01  SUBJECTIVE:  CHIEF COMPLAINT:   Chief Complaint  Patient presents with  . Shortness of Breath   Admitted for congestive heart failure/pulmonary edema which improved with hemodialysis.  Temporary femoral hemodialysis catheter removed 06/26/2017.    Poor urine output and SOB. Some confusion overnight and now better  SOB improving  REVIEW OF SYSTEMS:   Review of Systems  Constitutional: Negative for fever and malaise/fatigue.  HENT: Negative for congestion, ear discharge and tinnitus.   Eyes: Negative for blurred vision.  Respiratory: Negative for cough, shortness of breath and wheezing.   Cardiovascular: Positive for leg swelling. Negative for chest pain, palpitations and orthopnea.  Gastrointestinal: Negative for abdominal pain, diarrhea, nausea and vomiting.  Genitourinary: Negative for dysuria and urgency.  Skin: Negative for rash.  Neurological: Positive for weakness. Negative for tremors, sensory change and focal weakness.  Psychiatric/Behavioral: Negative for depression and memory loss.    DRUG ALLERGIES:   Allergies  Allergen Reactions  . Neurontin [Gabapentin] Other (See Comments)    Swelling when taking everyday    VITALS:  Blood pressure 136/69, pulse 87, temperature 97.9 F (36.6 C), temperature source Oral, resp. rate 14, height 5\' 8"  (1.727 m), weight 81.6 kg (179 lb 12.8 oz), SpO2 98 %.  PHYSICAL EXAMINATION:  GENERAL:  76 y.o.-year-old patient lying in the bed with no acute distress.  EYES: Pupils equal, round, reactive to light and accommodation. No scleral icterus. Extraocular muscles intact.  HEENT: Head atraumatic, normocephalic. Oropharynx and nasopharynx clear. NECK:  Supple, no jugular venous distention. No thyroid enlargement, no tenderness.  LUNGS: Normal breath sounds bilaterally, no wheezing, rales,rhonchi or  crepitation. No use of accessory muscles of respiration.  CARDIOVASCULAR: S1, S2 normal. No murmurs, rubs, or gallops.  ABDOMEN: Soft, nontender, nondistended. Bowel sounds present. No organomegaly or mass. EXTREMITIES: b/l pedal edema,no cyanosis, or clubbing.  NEUROLOGIC: Cranial nerves intact, Moves all 4 limbs spontaneously and follows commands, generalized weakness. PSYCHIATRIC: The patient is alert and awake. SKIN: No obvious rash, lesion, or ulcer.   Physical Exam LABORATORY PANEL:   CBC Recent Labs  Lab 06/27/17 0546  WBC 10.8*  HGB 9.7*  HCT 29.8*  PLT 185   ------------------------------------------------------------------------------------------------------------------  Chemistries  Recent Labs  Lab 06/28/17 0442  07/02/17 0258  NA 138   < > 140  K 4.0   < > 4.2  CL 99*   < > 100*  CO2 28   < > 31  GLUCOSE 113*   < > 164*  BUN 96*   < > 68*  CREATININE 2.90*   < > 2.33*  CALCIUM 8.5*   < > 8.5*  MG 2.2  --   --    < > = values in this interval not displayed.   ------------------------------------------------------------------------------------------------------------------  Cardiac Enzymes No results for input(s): TROPONINI in the last 168 hours. ------------------------------------------------------------------------------------------------------------------  RADIOLOGY:  No results found.  ASSESSMENT AND PLAN:   Active Problems:   Acute renal failure superimposed on stage 4 chronic kidney disease (HCC)   Acute respiratory failure (HCC)   Acute renal failure (HCC)  1.  Acute respiratory failure due to Acute on chronic diastolic CHF Needed full ventilatory support and later extubated.  Fluid overload improved with temporary hemodialysis.  Hemodialysis catheter removed 06/26/2018. Tunneled HD cathter placed 06/29/2017 and resumed on HD. No HD today Discussed with Dr.  Lateef  2.  Diabetes mellitus type 2: Continue levemir per home regimen.  Stopped  steroids  3.  Coronary artery disease: Stable: Continue aspirin  4.  Hypertension Uncontrolled.  Increased doses of Norvasc and metoprolol. Improving  5.  Right knee pain.  No DVT on ultrasound.  Has right Baker's cyst.  Xray shows arthritis and some effusion. Had 3 days of  Prednisone  6. Acute inpatient delirium Monitor  Severe weakness.  Physical therapy evaluation.  SNF at discharge.  All the records are reviewed and case discussed with Care Management/Social Worker Management plans discussed with the patient, family and they are in agreement.  CODE STATUS: Full.  TOTAL TIME TAKING CARE OF THIS PATIENT: 35 minutes.   POSSIBLE D/C IN 1-2 DAYS, DEPENDING ON CLINICAL CONDITION.  Christopher [name] M.D on 07/02/2017   Between 7am to 6pm - Pager - [phone]  After 6pm go to [email or web address] - password [email or web address]  [facility]  Office  [phone]  CC: Primary care physician; System, Provider Not In  Note: This dictation was prepared with Dragon dictation along with smaller phrase technology. Any transcriptional errors that result from this process are unintentional.

## 2017-07-02 NOTE — Progress Notes (Signed)
[facility] [place], [place] 07/02/17  Subjective:  Patient resting comfortably in bed. He did complete hemodialysis yesterday. Urine output not recorded yesterday.  Objective:  Vital signs in last 24 hours:  Temp:  [97.6 F (36.4 C)-98.2 F (36.8 C)] 97.9 F (36.6 C) (11/22 0804) Pulse Rate:  [81-98] 87 (11/22 0804) Resp:  [14-23] 14 (11/22 0804) BP: (102-146)/(67-85) 136/69 (11/22 0804) SpO2:  [93 %-98 %] 98 % (11/22 0804) Weight:  [81.6 kg (179 lb 12.8 oz)] 81.6 kg (179 lb 12.8 oz) (11/22 0445)  Weight change: -0.3 kg (-10.6 oz) Filed Weights   07/01/17 0500 07/01/17 1012 07/02/17 0445  Weight: 84 kg (185 lb 3.2 oz) 84 kg (185 lb 3 oz) 81.6 kg (179 lb 12.8 oz)    Intake/Output:    Intake/Output Summary (Last 24 hours) at 07/02/2017 1046 Last data filed at 07/01/2017 1828 Gross per 24 hour  Intake 243 ml  Output 750 ml  Net -507 ml     Physical Exam: General:  Chronically ill-appearing  HEENT  hearing intact, OM moist  Neck  supple  Pulm/lungs  coarse breath sounds bilaterally normal effort  CVS/Heart  S1S2 no rubs  Abdomen:   Soft NTND BS present  Extremities: + Generalized dependent edema  Neurologic:   Awake, alert, follows commands             Basic Metabolic Panel:  Recent Labs  Lab 06/26/17 0436  06/28/17 0442 06/29/17 0542 06/30/17 0354 06/30/17 0919 07/01/17 1107 07/02/17 0258  NA 140   < > 138 141 141  --  140 140  K 3.7   < > 4.0 4.5 4.7  --  4.0 4.2  CL 102   < > 99* 102 103  --  99* 100*  CO2 26   < > 28 26 29   --  28 31  GLUCOSE 111*   < > 113* 153* 140*  --  108* 164*  BUN 75*   < > 96* 109* 92*  --  76* 68*  CREATININE 3.03*   < > 2.90* 3.18* 2.59*  --  2.41* 2.33*  CALCIUM 8.2*   < > 8.5* 8.4* 8.2*  --  8.6* 8.5*  MG 2.0  --  2.2  --   --   --   --   --   PHOS 4.6  --  5.9*  --   --  4.6  --   --    < > = values in this interval not displayed.     CBC: Recent Labs  Lab 06/26/17 0436 06/27/17 0546   WBC 10.2 10.8*  HGB 8.8* 9.7*  HCT 26.6* 29.8*  MCV 98.8 96.7  PLT 145* 185      Lab Results  Component Value Date   HEPBSAG Negative 06/29/2017      Microbiology:  No results found for this or any previous visit (from the past 240 hour(s)).  Coagulation Studies: No results for input(s): LABPROT, INR in the last 72 hours.  Urinalysis: No results for input(s): COLORURINE, LABSPEC, PHURINE, GLUCOSEU, HGBUR, BILIRUBINUR, KETONESUR, PROTEINUR, UROBILINOGEN, NITRITE, LEUKOCYTESUR in the last 72 hours.  Invalid input(s): APPERANCEUR    Imaging: No results found.   Medications:    . allopurinol  100 mg Oral Daily  . amitriptyline  10 mg Oral QHS  . amLODipine  10 mg Oral Daily  . aspirin  81 mg Per Tube Daily  . feeding supplement (NEPRO CARB STEADY)  237 mL Oral  BID BM  . furosemide  40 mg Oral BID  . heparin subcutaneous  5,000 Units Subcutaneous Q8H  . insulin aspart  0-15 Units Subcutaneous TID WC  . insulin aspart  0-5 Units Subcutaneous QHS  . insulin detemir  10 Units Subcutaneous BID AC & HS  . mouth rinse  15 mL Mouth Rinse q12n4p  . metoprolol tartrate  50 mg Oral BID  . multivitamin  1 tablet Oral QHS  . multivitamin with minerals  1 tablet Oral Once per day on Mon Thu  . omega-3 acid ethyl esters  1 g Oral Daily  . sodium chloride flush  3 mL Intravenous Q12H   acetaminophen, HYDROcodone-acetaminophen, ipratropium-albuterol, [DISCONTINUED] ondansetron **OR** ondansetron (ZOFRAN) IV  Assessment/ Plan:  76 y.o. caucasian male with chronic kidney disease stage IV baseline creatinine 2.5-3, coronary artery disease, hypertension, hyperlipidemia, left partial nephrectomy, gout, diabetes mellitus type II insulin dependent, diabetic peripheral neuropathy and osteoarthritis, who was admitted to [facility] on 06/20/2017 for SOB (shortness of breath) [R06.02] Hypoxia [R09.02] Acute renal failure, unspecified acute renal failure type (HCC) [N17  1.  Acute renal  failure, dialysis reinitiated 06/29/17.  2.  Hyperkalemia 3.  Chronic kidney stage IV 4.  Diabetes type 2 with chronic kidney disease 5.  Acute respiratory failure, previously requiring ventilator support, now on nasal cannula oxygen 6.  Generalized edema  -It appears that the patient continues to have rather poor urine output.  He did complete hemodialysis yesterday.  We will plan for dialysis again tomorrow.  Outpatient dialysis placement is still pending at the moment.  We will check in with the dialysis liaison tomorrow to determine the progress.  Otherwise supportive care as per hospitalist.   LOS: 12 Karmel Patricelli 11/22/201810:46 AM  [address]  [facility] [place], [place] [phone]

## 2017-07-02 NOTE — Progress Notes (Signed)
TB test site assessed and found to be negative.

## 2017-07-03 LAB — GLUCOSE, CAPILLARY
Glucose-Capillary: 99 mg/dL (ref 65–99)
Glucose-Capillary: 173 mg/dL — ABNORMAL HIGH (ref 65–99)
Glucose-Capillary: 159 mg/dL — ABNORMAL HIGH (ref 65–99)
Glucose-Capillary: 141 mg/dL — ABNORMAL HIGH (ref 65–99)

## 2017-07-03 MED ORDER — AMLODIPINE BESYLATE 5 MG PO TABS
5.0000 mg | ORAL_TABLET | Freq: Every day | ORAL | Status: DC
  Administered 2017-07-04 – 2017-07-06 (×3): 5 mg via ORAL
  Filled 2017-07-03 (×3): qty 1

## 2017-07-03 MED ORDER — INSULIN DETEMIR 100 UNIT/ML ~~LOC~~ SOLN
9.0000 [IU] | Freq: Two times a day (BID) | SUBCUTANEOUS | Status: DC
  Administered 2017-07-03 – 2017-07-06 (×5): 9 [IU] via SUBCUTANEOUS
  Filled 2017-07-03 (×7): qty 0.09

## 2017-07-03 MED ORDER — METOPROLOL TARTRATE 25 MG PO TABS
25.0000 mg | ORAL_TABLET | Freq: Two times a day (BID) | ORAL | Status: DC
  Administered 2017-07-03 – 2017-07-06 (×6): 25 mg via ORAL
  Filled 2017-07-03 (×7): qty 1

## 2017-07-03 NOTE — Progress Notes (Signed)
Physical Therapy Treatment Patient Details Name: Christopher [name] MRN: [id number] DOB: May 18, 1941 Today's Date: 07/03/2017    History of Present Illness 76 y.o. male presenting to hospital 06/20/17 with SOB (pt with significant SOB coming back from fishing trip; O2 noted to be 67% and EMS initiated CPAP).  Pt admitted to hospital with acute on chronic kidney disease (fluid overloaded resulting in hypoxia).  Pt transferred to CCU 06/21/17 secondary to unresponsiveness and respiratory distress.  Pt diagnosed with acute hypoxic/hypercarbic respiratory failure, acute pulmonary edema, acute CHF exacerbation, and acute on chronic renal failure.  Pt intubated 06/21/17 and extubated 06/24/17.  CRRT initiated 06/21/17 and discontinued 06/23/17, pt was receiving HD via temporary R HD fem cath, since removed.  PMH includes anxiety, CKD, htn, MI, anginal pain, chronic back pain, DM, anterior fusion c-spine, B RCR, and L TKA.    PT Comments    Pt again very pleasant and willing to participate with PT.  He is fatigued from dialysis and we deferred mobility acts this afternoon, but he showed good effort with supine exercises.  Pt tolerates very little ROM activities with the R knee, but with repeated SAQ did show some increased tolerance though ROM remains limited.  Pt needed reminders to breath t/o the effort of exercises, family present and encouraging.     Follow Up Recommendations        Equipment Recommendations       Recommendations for Other Services       Precautions / Restrictions Precautions Precautions: Fall Restrictions Weight Bearing Restrictions: No    Mobility  Bed Mobility               General bed mobility comments: deferred mobility acts today, tired from dialysis  Transfers                    Ambulation/Gait                 Stairs            Wheelchair Mobility    Modified Rankin (Stroke Patients Only)       Balance                                            Cognition Arousal/Alertness: Awake/alert Behavior During Therapy: WFL for tasks assessed/performed Overall Cognitive Status: Within Functional Limits for tasks assessed                                        Exercises Total Joint Exercises Ankle Circles/Pumps: Strengthening;10 reps Quad Sets: Strengthening;10 reps Short Arc Quad: Strengthening;10 reps;Right Heel Slides: Strengthening;Left;10 reps Hip ABduction/ADduction: Strengthening;10 reps    General Comments        Pertinent Vitals/Pain Pain Assessment: (pain with any R knee ROM acts) Pain Score: (not rated, self limiting)    Home Living                      Prior Function            PT Goals (current goals can now be found in the care plan section) Progress towards PT goals: Progressing toward goals    Frequency    Min 2X/week      PT Plan Current plan remains appropriate  Co-evaluation              AM-PAC PT "6 Clicks" Daily Activity  Outcome Measure  Difficulty turning over in bed (including adjusting bedclothes, sheets and blankets)?: Unable Difficulty moving from lying on back to sitting on the side of the bed? : Unable Difficulty sitting down on and standing up from a chair with arms (e.g., wheelchair, bedside commode, etc,.)?: Unable Help needed moving to and from a bed to chair (including a wheelchair)?: Total Help needed walking in hospital room?: Total Help needed climbing 3-5 steps with a railing? : Total 6 Click Score: 6    End of Session Equipment Utilized During Treatment: Oxygen Activity Tolerance: Patient limited by pain;Patient limited by fatigue Patient left: with call bell/phone within reach;with bed alarm set;with family/visitor present   PT Visit Diagnosis: Other abnormalities of gait and mobility (R26.89);History of falling (Z91.81);Muscle weakness (generalized) (M62.81)     Time: [id number] PT Time  Calculation (min) (ACUTE ONLY): 15 min  Charges:  $Therapeutic Exercise: 8-22 mins                    G Codes:       Christopher [name], DPT 07/03/2017, 5:35 PM

## 2017-07-03 NOTE — Progress Notes (Signed)
PT Cancellation Note  Patient Details Name: Christopher [name] MRN: [id number] DOB: [birth year]   Cancelled Treatment:    Reason Eval/Treat Not Completed: Patient at procedure or test/unavailable Pt out of room for dialysis, will try back as time allows.    [name], DPT 07/03/2017, 1:11 PM

## 2017-07-03 NOTE — Progress Notes (Signed)
[facility] [place], [place] 07/03/17  Subjective:  Patient seen and evaluated during hemodialysis Blood flow rate 400. Appears to be in good spirits.   Objective:  Vital signs in last 24 hours:  Temp:  [97.5 F (36.4 C)-98.4 F (36.9 C)] 98.3 F (36.8 C) (11/23 1000) Pulse Rate:  [83-99] 95 (11/23 1115) Resp:  [17-29] 17 (11/23 1115) BP: (89-157)/(54-81) 113/77 (11/23 1115) SpO2:  [91 %-100 %] 95 % (11/23 1115) Weight:  [81.7 kg (180 lb 1.6 oz)-84.1 kg (185 lb 6.5 oz)] 84.1 kg (185 lb 6.5 oz) (11/23 1000)  Weight change: -2.307 kg (-1.4 oz) Filed Weights   07/02/17 0445 07/03/17 0557 07/03/17 1000  Weight: 81.6 kg (179 lb 12.8 oz) 81.7 kg (180 lb 1.6 oz) 84.1 kg (185 lb 6.5 oz)    Intake/Output:    Intake/Output Summary (Last 24 hours) at 07/03/2017 1123 Last data filed at 07/03/2017 0953 Gross per 24 hour  Intake 480 ml  Output 1850 ml  Net -1370 ml     Physical Exam: General:  Chronically ill-appearing  HEENT  hearing intact, OM moist  Neck  supple  Pulm/lungs  scattered rhonchi bilateral normal effort  CVS/Heart  S1S2 no rubs  Abdomen:   Soft NTND BS present  Extremities:  Trace LE edema  Neurologic:   Awake, alert, follows commands             Basic Metabolic Panel:  Recent Labs  Lab 06/28/17 0442 06/29/17 0542 06/30/17 0354 06/30/17 0919 07/01/17 1107 07/02/17 0258  NA 138 141 141  --  140 140  K 4.0 4.5 4.7  --  4.0 4.2  CL 99* 102 103  --  99* 100*  CO2 28 26 29   --  28 31  GLUCOSE 113* 153* 140*  --  108* 164*  BUN 96* 109* 92*  --  76* 68*  CREATININE 2.90* 3.18* 2.59*  --  2.41* 2.33*  CALCIUM 8.5* 8.4* 8.2*  --  8.6* 8.5*  MG 2.2  --   --   --   --   --   PHOS 5.9*  --   --  4.6  --   --      CBC: Recent Labs  Lab 06/27/17 0546  WBC 10.8*  HGB 9.7*  HCT 29.8*  MCV 96.7  PLT 185      Lab Results  Component Value Date   HEPBSAG Negative 06/29/2017      Microbiology:  No results found for  this or any previous visit (from the past 240 hour(s)).  Coagulation Studies: No results for input(s): LABPROT, INR in the last 72 hours.  Urinalysis: No results for input(s): COLORURINE, LABSPEC, PHURINE, GLUCOSEU, HGBUR, BILIRUBINUR, KETONESUR, PROTEINUR, UROBILINOGEN, NITRITE, LEUKOCYTESUR in the last 72 hours.  Invalid input(s): APPERANCEUR    Imaging: No results found.   Medications:    . allopurinol  100 mg Oral Daily  . amitriptyline  10 mg Oral QHS  . amLODipine  10 mg Oral Daily  . aspirin  81 mg Per Tube Daily  . feeding supplement (NEPRO CARB STEADY)  237 mL Oral BID BM  . furosemide  40 mg Oral BID  . heparin subcutaneous  5,000 Units Subcutaneous Q8H  . insulin aspart  0-15 Units Subcutaneous TID WC  . insulin aspart  0-5 Units Subcutaneous QHS  . insulin detemir  10 Units Subcutaneous BID AC & HS  . mouth rinse  15 mL Mouth Rinse q12n4p  . metoprolol  tartrate  50 mg Oral BID  . multivitamin  1 tablet Oral QHS  . multivitamin with minerals  1 tablet Oral Once per day on Mon Thu  . omega-3 acid ethyl esters  1 g Oral Daily  . sodium chloride flush  3 mL Intravenous Q12H   acetaminophen, HYDROcodone-acetaminophen, ipratropium-albuterol, [DISCONTINUED] ondansetron **OR** ondansetron (ZOFRAN) IV  Assessment/ Plan:  76 y.o. caucasian male with chronic kidney disease stage IV baseline creatinine 2.5-3, coronary artery disease, hypertension, hyperlipidemia, left partial nephrectomy, gout, diabetes mellitus type II insulin dependent, diabetic peripheral neuropathy and osteoarthritis, who was [facility] to [facility] 06/20/2017 for SOB (shortness of breath) [R06.02] Hypoxia [R09.02] Acute renal failure, unspecified acute renal failure type (HCC) [N17  1.  Acute renal failure, dialysis reinitiated 06/29/17.  2.  Hyperkalemia 3.  Chronic kidney stage IV 4.  Diabetes type 2 with chronic kidney disease 5.  Acute respiratory failure, previously requiring ventilator support,  now on nasal cannula oxygen 6.  Generalized edema  -Patient seen and evaluated during hemodialysis today.  He appears to be tolerating this quite well.  Outpatient dialysis placement still pending at the moment.  Hopefully we should hear back something from the dialysis center that he will be placed at later today.   LOS: 13 Christopher [name] 11/23/201811:23 AM  [address] [facility] [place], [place] [phone]

## 2017-07-03 NOTE — Progress Notes (Signed)
Post HD assessment  

## 2017-07-03 NOTE — Progress Notes (Signed)
[employer] - [place] at [facility]   PATIENT NAME: Christopher [name]    MR#:  [id number]  DATE OF BIRTH:  Jun 23, 1941  SUBJECTIVE:  CHIEF COMPLAINT:   Chief Complaint  Patient presents with  . Shortness of Breath   Admitted for congestive heart failure/pulmonary edema which improved with hemodialysis.  Temporary femoral hemodialysis catheter removed 06/26/2017.    Awake and alert. Slept well HD today  REVIEW OF SYSTEMS:   Review of Systems  Constitutional: Negative for fever and malaise/fatigue.  HENT: Negative for congestion, ear discharge and tinnitus.   Eyes: Negative for blurred vision.  Respiratory: Negative for cough, shortness of breath and wheezing.   Cardiovascular: Positive for leg swelling. Negative for chest pain, palpitations and orthopnea.  Gastrointestinal: Negative for abdominal pain, diarrhea, nausea and vomiting.  Genitourinary: Negative for dysuria and urgency.  Skin: Negative for rash.  Neurological: Positive for weakness. Negative for tremors, sensory change and focal weakness.  Psychiatric/Behavioral: Negative for depression and memory loss.    DRUG ALLERGIES:   Allergies  Allergen Reactions  . Neurontin [Gabapentin] Other (See Comments)    Swelling when taking everyday    VITALS:  Blood pressure 103/73, pulse 99, temperature 98.3 F (36.8 C), temperature source Oral, resp. rate 19, height 5\' 8"  (1.727 m), weight 84.1 kg (185 lb 6.5 oz), SpO2 98 %.  PHYSICAL EXAMINATION:  GENERAL:  76 y.o.-year-old patient lying in the bed with no acute distress.  EYES: Pupils equal, round, reactive to light and accommodation. No scleral icterus. Extraocular muscles intact.  HEENT: Head atraumatic, normocephalic. Oropharynx and nasopharynx clear. NECK:  Supple, no jugular venous distention. No thyroid enlargement, no tenderness.  LUNGS: Normal breath sounds bilaterally, no wheezing, rales,rhonchi or crepitation. No use of accessory muscles of  respiration.  CARDIOVASCULAR: S1, S2 normal. No murmurs, rubs, or gallops.  ABDOMEN: Soft, nontender, nondistended. Bowel sounds present. No organomegaly or mass. EXTREMITIES: b/l pedal edema,no cyanosis, or clubbing.  NEUROLOGIC: Cranial nerves intact, Moves all 4 limbs spontaneously and follows commands, generalized weakness. PSYCHIATRIC: The patient is alert and awake. SKIN: No obvious rash, lesion, or ulcer.   Physical Exam LABORATORY PANEL:   CBC Recent Labs  Lab 06/27/17 0546  WBC 10.8*  HGB 9.7*  HCT 29.8*  PLT 185   ------------------------------------------------------------------------------------------------------------------  Chemistries  Recent Labs  Lab 06/28/17 0442  07/02/17 0258  NA 138   < > 140  K 4.0   < > 4.2  CL 99*   < > 100*  CO2 28   < > 31  GLUCOSE 113*   < > 164*  BUN 96*   < > 68*  CREATININE 2.90*   < > 2.33*  CALCIUM 8.5*   < > 8.5*  MG 2.2  --   --    < > = values in this interval not displayed.   ------------------------------------------------------------------------------------------------------------------  Cardiac Enzymes No results for input(s): TROPONINI in the last 168 hours. ------------------------------------------------------------------------------------------------------------------  RADIOLOGY:  No results found.  ASSESSMENT AND PLAN:   Active Problems:   Acute renal failure superimposed on stage 4 chronic kidney disease (HCC)   Acute respiratory failure (HCC)   Acute renal failure (HCC)  1.  Acute respiratory failure due to Acute on chronic diastolic CHF Needed full ventilatory support and later extubated.  Fluid overload improved with temporary hemodialysis.  Hemodialysis catheter removed 06/26/2018. Tunneled HD cathter placed 06/29/2017 and resumed on HD. Discussed with Dr. Cherylann RatelLateef HD again today Waiting for outpatient dialysis slot  2.  Diabetes mellitus type 2: Continue levemir per home regimen.  Stopped  steroids  3.  Coronary artery disease: Stable: Continue aspirin  4.  Hypertension Uncontrolled.  Increased doses of Norvasc and metoprolol. Improving  5.  Right knee pain.  No DVT on ultrasound.  Has right Baker's cyst.  Xray shows arthritis and some effusion. Had 3 days of  Prednisone  6. Acute inpatient delirium Monitor  7 weakness.  Physical therapy evaluation.  SNF at discharge.  All the records are reviewed and case discussed with Care Management/Social Worker Management plans discussed with the patient, family and they are in agreement.  CODE STATUS: Full.  TOTAL TIME TAKING CARE OF THIS PATIENT: 35 minutes.   POSSIBLE D/C IN 1-2 DAYS, DEPENDING ON CLINICAL CONDITION.  [name] M.D on 07/03/2017   Between 7am to 6pm - Pager - [phone]  After 6pm go to [email or web address] - password [email or web address]  [facility] [place] [facility]  Office  [phone]  CC: Primary care physician; System, Provider Not In  Note: This dictation was prepared with Dragon dictation along with smaller phrase technology. Any transcriptional errors that result from this process are unintentional.

## 2017-07-03 NOTE — Progress Notes (Signed)
HD tx end  

## 2017-07-03 NOTE — Progress Notes (Signed)
OT Cancellation Note  Patient Details Name: Christopher SagoRalph T Belknap MRN: [id number] DOB: 1941-06-26   Cancelled Treatment:    Reason Eval/Treat Not Completed: Patient at procedure or test/ unavailable. Pt out of room for dialysis this morning. Will re-attempt at later date/time as pt is available and as schedule permits.  Richrd PrimeJamie Stiller, MPH, MS, OTR/L ascom [phone] 07/03/17, 12:16 PM

## 2017-07-03 NOTE — Progress Notes (Signed)
Pre HD assessment  

## 2017-07-03 NOTE — Plan of Care (Signed)
  Progressing Pain Managment: General experience of comfort will improve 07/03/2017 0526 - Progressing by Stefan Churchogers, Kiano Terrien M, RN

## 2017-07-03 NOTE — Progress Notes (Signed)
PPD Results    07/02/17 1100  PPD Results  Does patient have an induration at the injection site? No

## 2017-07-03 NOTE — Progress Notes (Signed)
HD tx start 

## 2017-07-04 LAB — BLOOD GAS, ARTERIAL
ACID-BASE DEFICIT: 5 mmol/L — AB (ref 0.0–2.0)
BICARBONATE: 21.2 mmol/L (ref 20.0–28.0)
MECHANICAL RATE: 25
MECHVT: 550 mL
O2 Saturation: 95 %
PATIENT TEMPERATURE: 37
PEEP/CPAP: 5 cmH2O
PH ART: 7.3 — AB (ref 7.350–7.450)
PO2 ART: 75 mmHg — AB (ref 83.0–108.0)
RATE: 25 resp/min
pCO2 arterial: 43 mmHg (ref 32.0–48.0)

## 2017-07-04 LAB — GLUCOSE, CAPILLARY
Glucose-Capillary: 123 mg/dL — ABNORMAL HIGH (ref 65–99)
Glucose-Capillary: 152 mg/dL — ABNORMAL HIGH (ref 65–99)
Glucose-Capillary: 84 mg/dL (ref 65–99)
Glucose-Capillary: 96 mg/dL (ref 65–99)

## 2017-07-04 MED ORDER — AMITRIPTYLINE HCL 25 MG PO TABS
25.0000 mg | ORAL_TABLET | Freq: Every day | ORAL | Status: DC
  Administered 2017-07-04 – 2017-07-06 (×3): 25 mg via ORAL
  Filled 2017-07-04 (×3): qty 1

## 2017-07-04 NOTE — Plan of Care (Signed)
  Progressing Education: Knowledge of General Education information will improve 07/04/2017 0211 - Progressing by Dorna LeitzNesbitt, Trista Ciocca M, RN Safety: Ability to remain free from injury will improve 07/04/2017 0211 - Progressing by Dorna LeitzNesbitt, Sanad Fearnow M, RN   Not Progressing Activity: Capacity to carry out activities will improve 07/04/2017 0211 - Not Progressing by Dorna LeitzNesbitt, Sharee Sturdy M, RN Note Patient still very weak. Staff encouraging ROM in bed.

## 2017-07-04 NOTE — Clinical Social Work Note (Signed)
CSW is continuing to follow this patient for discharge facilitation once hemodialysis has been initiated in the community. The patient's PD was negative for TB.   Argentina PonderKaren Martha Hartley Wyke, MSW, Theresia MajorsLCSWA [phone]

## 2017-07-04 NOTE — Progress Notes (Signed)
[facility] [place], [place] 07/04/17  Subjective:  Patient had hemodialysis yesterday. Currently resting in bed comfortably.   Objective:  Vital signs in last 24 hours:  Temp:  [97.5 F (36.4 C)-98.7 F (37.1 C)] 97.9 F (36.6 C) (11/24 0831) Pulse Rate:  [94-114] 95 (11/24 0831) Resp:  [17-28] 18 (11/23 2029) BP: (90-132)/(61-97) 130/70 (11/24 0831) SpO2:  [95 %-100 %] 95 % (11/24 0831) Weight:  [81.1 kg (178 lb 12.8 oz)-82.7 kg (182 lb 5.1 oz)] 81.1 kg (178 lb 12.8 oz) (11/24 0626)  Weight change: 2.407 kg (5 lb 4.9 oz) Filed Weights   07/03/17 1000 07/03/17 1358 07/04/17 0626  Weight: 84.1 kg (185 lb 6.5 oz) 82.7 kg (182 lb 5.1 oz) 81.1 kg (178 lb 12.8 oz)    Intake/Output:    Intake/Output Summary (Last 24 hours) at 07/04/2017 1053 Last data filed at 07/04/2017 0200 Gross per 24 hour  Intake 240 ml  Output 866 ml  Net -626 ml     Physical Exam: General:  Chronically ill-appearing  HEENT  hearing intact, OM moist  Neck  supple  Pulm/lungs  scattered rhonchi bilateral normal effort  CVS/Heart  S1S2 no rubs  Abdomen:   Soft NTND BS present  Extremities:  Trace LE edema  Neurologic:   Awake, alert, follows commands             Basic Metabolic Panel:  Recent Labs  Lab 06/28/17 0442 06/29/17 0542 06/30/17 0354 06/30/17 0919 07/01/17 1107 07/02/17 0258  NA 138 141 141  --  140 140  K 4.0 4.5 4.7  --  4.0 4.2  CL 99* 102 103  --  99* 100*  CO2 28 26 29   --  28 31  GLUCOSE 113* 153* 140*  --  108* 164*  BUN 96* 109* 92*  --  76* 68*  CREATININE 2.90* 3.18* 2.59*  --  2.41* 2.33*  CALCIUM 8.5* 8.4* 8.2*  --  8.6* 8.5*  MG 2.2  --   --   --   --   --   PHOS 5.9*  --   --  4.6  --   --      CBC: No results for input(s): WBC, NEUTROABS, HGB, HCT, MCV, PLT in the last 168 hours.    Lab Results  Component Value Date   HEPBSAG Negative 06/29/2017      Microbiology:  No results found for this or any previous visit (from  the past 240 hour(s)).  Coagulation Studies: No results for input(s): LABPROT, INR in the last 72 hours.  Urinalysis: No results for input(s): COLORURINE, LABSPEC, PHURINE, GLUCOSEU, HGBUR, BILIRUBINUR, KETONESUR, PROTEINUR, UROBILINOGEN, NITRITE, LEUKOCYTESUR in the last 72 hours.  Invalid input(s): APPERANCEUR    Imaging: No results found.   Medications:    . allopurinol  100 mg Oral Daily  . amitriptyline  10 mg Oral QHS  . amLODipine  5 mg Oral Daily  . aspirin  81 mg Per Tube Daily  . feeding supplement (NEPRO CARB STEADY)  237 mL Oral BID BM  . furosemide  40 mg Oral BID  . heparin subcutaneous  5,000 Units Subcutaneous Q8H  . insulin aspart  0-15 Units Subcutaneous TID WC  . insulin aspart  0-5 Units Subcutaneous QHS  . insulin detemir  9 Units Subcutaneous BID AC & HS  . mouth rinse  15 mL Mouth Rinse q12n4p  . metoprolol tartrate  25 mg Oral BID  . multivitamin  1 tablet Oral  QHS  . multivitamin with minerals  1 tablet Oral Once per day on Mon Thu  . omega-3 acid ethyl esters  1 g Oral Daily  . sodium chloride flush  3 mL Intravenous Q12H   acetaminophen, HYDROcodone-acetaminophen, ipratropium-albuterol, [DISCONTINUED] ondansetron **OR** ondansetron (ZOFRAN) IV  Assessment/ Plan:  76 y.o. caucasian male with chronic kidney disease stage IV baseline creatinine 2.5-3, coronary artery disease, hypertension, hyperlipidemia, left partial nephrectomy, gout, diabetes mellitus type II insulin dependent, diabetic peripheral neuropathy and osteoarthritis, who was [facility] 06/20/2017 for SOB (shortness of breath) [R06.02] Hypoxia [R09.02] Acute renal failure, unspecified acute renal failure type (HCC) [N17  1.  Acute renal failure, dialysis reinitiated 06/29/17.  2.  Hyperkalemia 3.  Chronic kidney stage IV 4.  Diabetes type 2 with chronic kidney disease 5.  Acute respiratory failure, previously requiring ventilator support, now on nasal cannula oxygen 6.   Generalized edema  -We are still awaiting patient placement [place].  This will likely not be performed until Monday.  No urgent indication for dialysis today.  We will plan for dialysis again on Monday.  Hopefully his outpatient dialysis can be secured early next week.  Otherwise continue supportive care.   LOS: 14 Christophere Hillhouse 11/24/201810:53 AM  [address] [facility] [place], [place] [phone]

## 2017-07-04 NOTE — Progress Notes (Signed)
[employer] - [place] at [facility]   PATIENT NAME: Christopher GambleRalph [name]    MR#:  [id number]  DATE OF BIRTH:  07/20/1941  SUBJECTIVE:  CHIEF COMPLAINT:   Chief Complaint  Patient presents with  . Shortness of Breath   Admitted for congestive heart failure/pulmonary edema which improved with hemodialysis.  Temporary femoral hemodialysis catheter removed 06/26/2017.    Awake and alert. Slept well No pain Feels weak Good appetite  REVIEW OF SYSTEMS:   Review of Systems  Constitutional: Negative for fever and malaise/fatigue.  HENT: Negative for congestion, ear discharge and tinnitus.   Eyes: Negative for blurred vision.  Respiratory: Negative for cough, shortness of breath and wheezing.   Cardiovascular: Positive for leg swelling. Negative for chest pain, palpitations and orthopnea.  Gastrointestinal: Negative for abdominal pain, diarrhea, nausea and vomiting.  Genitourinary: Negative for dysuria and urgency.  Skin: Negative for rash.  Neurological: Positive for weakness. Negative for tremors, sensory change and focal weakness.  Psychiatric/Behavioral: Negative for depression and memory loss.    DRUG ALLERGIES:   Allergies  Allergen Reactions  . Neurontin [Gabapentin] Other (See Comments)    Swelling when taking everyday    VITALS:  Blood pressure 130/70, pulse 95, temperature 97.9 F (36.6 C), temperature source Oral, resp. rate 18, height 5\' 8"  (1.727 m), weight 81.1 kg (178 lb 12.8 oz), SpO2 95 %.  PHYSICAL EXAMINATION:  GENERAL:  76 y.o.-year-old patient lying in the bed with no acute distress.  EYES: Pupils equal, round, reactive to light and accommodation. No scleral icterus. Extraocular muscles intact.  HEENT: Head atraumatic, normocephalic. Oropharynx and nasopharynx clear. NECK:  Supple, no jugular venous distention. No thyroid enlargement, no tenderness.  LUNGS: Normal breath sounds bilaterally, no wheezing, rales,rhonchi or crepitation. No use of  accessory muscles of respiration.  CARDIOVASCULAR: S1, S2 normal. No murmurs, rubs, or gallops.  ABDOMEN: Soft, nontender, nondistended. Bowel sounds present. No organomegaly or mass. EXTREMITIES: b/l pedal edema,no cyanosis, or clubbing.  NEUROLOGIC: Cranial nerves intact, Moves all 4 limbs spontaneously and follows commands, generalized weakness. PSYCHIATRIC: The patient is alert and awake. SKIN: No obvious rash, lesion, or ulcer.   Physical Exam LABORATORY PANEL:   CBC No results for input(s): WBC, HGB, HCT, PLT in the last 168 hours. ------------------------------------------------------------------------------------------------------------------  Chemistries  Recent Labs  Lab 06/28/17 0442  07/02/17 0258  NA 138   < > 140  K 4.0   < > 4.2  CL 99*   < > 100*  CO2 28   < > 31  GLUCOSE 113*   < > 164*  BUN 96*   < > 68*  CREATININE 2.90*   < > 2.33*  CALCIUM 8.5*   < > 8.5*  MG 2.2  --   --    < > = values in this interval not displayed.   ------------------------------------------------------------------------------------------------------------------  Cardiac Enzymes No results for input(s): TROPONINI in the last 168 hours. ------------------------------------------------------------------------------------------------------------------  RADIOLOGY:  No results found.  ASSESSMENT AND PLAN:   Active Problems:   Acute renal failure superimposed on stage 4 chronic kidney disease (HCC)   Acute respiratory failure (HCC)   Acute renal failure (HCC)  1.  Acute respiratory failure due to Acute on chronic diastolic CHF Needed full ventilatory support and later extubated.  Fluid overload improved with temporary hemodialysis.  Hemodialysis catheter removed 06/26/2018. Tunneled HD cathter placed 06/29/2017 and resumed on HD. Discussed with Dr. Cherylann RatelLateef HD yesterday Waiting for outpatient dialysis slot  2.  Diabetes mellitus  type 2 Continue levemir per home regimen.   Stopped steroids  3.  Coronary artery disease: Stable: Continue aspirin  4.  Hypertension Uncontrolled.  Increased doses of Norvasc and metoprolol. Improving  5.  Right knee pain.  No DVT on ultrasound.  Has right Baker's cyst.  Xray showed arthritis and some effusion. Had 3 days of  Prednisone  6. Acute inpatient delirium Monitor  7 weakness.  Physical therapy evaluation.  SNF at discharge.  All the records are reviewed and case discussed with Care Management/Social Worker Management plans discussed with the patient, family and they are in agreement.  CODE STATUS: Full.  TOTAL TIME TAKING CARE OF THIS PATIENT: 35 minutes.   POSSIBLE D/C IN 1-2 DAYS, DEPENDING ON CLINICAL CONDITION.  Orie FishermanSrikar R Anis Cinelli M.D on 07/04/2017   Between 7am to 6pm - Pager - [phone]  After 6pm go to [email or web address] - password [email or web address]  [facility] [place] [facility]  Office  [phone]  CC: Primary care physician; System, Provider Not In  Note: This dictation was prepared with Dragon dictation along with smaller phrase technology. Any transcriptional errors that result from this process are unintentional.

## 2017-07-04 NOTE — Progress Notes (Signed)
Occupational Therapy Treatment Patient Details Name: Christopher [name] MRN: [id number] DOB: [birth year] Today's Date: 07/04/2017    History of present illness 76 y.o. male presenting to hospital 06/20/17 with SOB (pt with significant SOB coming back from fishing trip; O2 noted to be 67% and EMS initiated CPAP).  Pt admitted to hospital with acute on chronic kidney disease (fluid overloaded resulting in hypoxia).  Pt transferred to CCU 06/21/17 secondary to unresponsiveness and respiratory distress.  Pt diagnosed with acute hypoxic/hypercarbic respiratory failure, acute pulmonary edema, acute CHF exacerbation, and acute on chronic renal failure.  Pt intubated 06/21/17 and extubated 06/24/17.  CRRT initiated 06/21/17 and discontinued 06/23/17, pt was receiving HD via temporary R HD fem cath, since removed.  PMH includes anxiety, CKD, htn, MI, anginal pain, chronic back pain, DM, anterior fusion c-spine, B RCR, and L TKA.   OT comments  Pt seen for OT treatment this date. Pt with family at bedside assisting with lunch. Educated pt/family in bed positioning to minimize risk of aspiration. Mod-max assist to support more upright body positioning and pillow placed under RUE to prop pt up. Nasal cannula displaced upon entry into room. O2 sats on RA 87% on RA, increasing to 95% within <1 min with nasal cannula in place. Pt/family educated in use of oxygen and positioning of nasal cannula. Pt/family verbalized understanding for education/training provided this date including pursed lip breathing, energy conservation strategies, work simplification, home/routine modifications, AE/DME for bathing/dressing/toileting. Handout provided. Will continue to progress. STR remains appropriate upon discharge.    Follow Up Recommendations  SNF    Equipment Recommendations  Other (comment)(TBD at next venue of care)    Recommendations for Other Services      Precautions / Restrictions Precautions Precautions:  Fall Precaution Comments: Monitor O2 Restrictions Weight Bearing Restrictions: No       Mobility Bed Mobility                  Transfers                      Balance                                           ADL either performed or assessed with clinical judgement   ADL Overall ADL's : Independent;Needs assistance/impaired Eating/Feeding: Minimal assistance   Grooming: Moderate assistance   Upper Body Bathing: Moderate assistance   Lower Body Bathing: Maximal assistance   Upper Body Dressing : Moderate assistance   Lower Body Dressing: Maximal assistance                       Vision Baseline Vision/History: No visual deficits     Perception     Praxis      Cognition Arousal/Alertness: Awake/alert Behavior During Therapy: WFL for tasks assessed/performed Overall Cognitive Status: Within Functional Limits for tasks assessed                                          Exercises Other Exercises Other Exercises: Pt/family verbalized understanding for education/training provided this date including pursed lip breathing, energy conservation strategies, work simplification, home/routine modifications, AE/DME for bathing/dressing/toileting. Handout provided.  Other Exercises: Pt/family educated in use of oxygen, as pt found with nasal  cannula twisted and not positioning in nostrils. Adjusted nasal cannula and monitored O2 sats. Initially 87% on RA, increasing to 95% within <1 min with nasal cannula in place.    Shoulder Instructions       General Comments      Pertinent Vitals/ Pain       Pain Assessment: No/denies pain  Home Living                                          Prior Functioning/Environment              Frequency  Min 1X/week        Progress Toward Goals  OT Goals(current goals can now be found in the care plan section)  Progress towards OT goals: Progressing  toward goals  Acute Rehab OT Goals Patient Stated Goal: to get stronger OT Goal Formulation: With patient/family Time For Goal Achievement: 07/10/17 Potential to Achieve Goals: Fair  Plan Discharge plan remains appropriate;Frequency remains appropriate    Co-evaluation                 AM-PAC PT "6 Clicks" Daily Activity     Outcome Measure   Help from another person eating meals?: A Little Help from another person taking care of personal grooming?: A Lot Help from another person toileting, which includes using toliet, bedpan, or urinal?: Total Help from another person bathing (including washing, rinsing, drying)?: A Lot Help from another person to put on and taking off regular upper body clothing?: A Lot Help from another person to put on and taking off regular lower body clothing?: Total 6 Click Score: 11    End of Session    OT Visit Diagnosis: History of falling (Z91.81);Muscle weakness (generalized) (M62.81)   Activity Tolerance     Patient Left in bed;with call bell/phone within reach;with bed alarm set;with family/visitor present   Nurse Communication          Time: 1344-1400 OT Time Calculation (min): 16 min  Charges: OT General Charges $OT Visit: 1 Visit OT Treatments $Self Care/Home Management : 8-22 mins  Richrd PrimeJamie Stiller, MPH, MS, OTR/L ascom [phone] 07/04/17, 2:10 PM

## 2017-07-05 LAB — GLUCOSE, CAPILLARY
Glucose-Capillary: 87 mg/dL (ref 65–99)
Glucose-Capillary: 218 mg/dL — ABNORMAL HIGH (ref 65–99)
Glucose-Capillary: 133 mg/dL — ABNORMAL HIGH (ref 65–99)
Glucose-Capillary: 141 mg/dL — ABNORMAL HIGH (ref 65–99)

## 2017-07-05 NOTE — Progress Notes (Addendum)
[facility] [employer] - [place] [facility]   PATIENT NAME: Augusto GambleRalph Pardoe    MR#:  [id number]  DATE OF BIRTH:  22-Jul-1941  SUBJECTIVE:  CHIEF COMPLAINT:   Chief Complaint  Patient presents with  . Shortness of Breath  feels better REVIEW OF SYSTEMS:  Review of Systems  Constitutional: Negative for chills, fever and weight loss.  HENT: Negative for nosebleeds and sore throat.   Eyes: Negative for blurred vision.  Respiratory: Negative for cough, shortness of breath and wheezing.   Cardiovascular: Negative for chest pain, orthopnea, leg swelling and PND.  Gastrointestinal: Negative for abdominal pain, constipation, diarrhea, heartburn, nausea and vomiting.  Genitourinary: Negative for dysuria and urgency.  Musculoskeletal: Negative for back pain.  Skin: Negative for rash.  Neurological: Negative for dizziness, speech change, focal weakness and headaches.  Endo/Heme/Allergies: Does not bruise/bleed easily.  Psychiatric/Behavioral: Negative for depression.    DRUG ALLERGIES:   Allergies  Allergen Reactions  . Neurontin [Gabapentin] Other (See Comments)    Swelling when taking everyday   VITALS:  Blood pressure 130/70, pulse 92, temperature 98.2 F (36.8 C), temperature source Oral, resp. rate 17, height 5\' 8"  (1.727 m), weight 81.1 kg (178 lb 12.8 oz), SpO2 96 %. PHYSICAL EXAMINATION:  Physical Exam  Constitutional: He is oriented to person, place, and time and well-developed, well-nourished, and in no distress.  HENT:  Head: Normocephalic and atraumatic.  Eyes: Conjunctivae and EOM are normal. Pupils are equal, round, and reactive to light.  Neck: Normal range of motion. Neck supple. No tracheal deviation present. No thyromegaly present.  Cardiovascular: Normal rate, regular rhythm and normal heart sounds.  Pulmonary/Chest: Effort normal and breath sounds normal. No respiratory distress. He has no wheezes. He exhibits no tenderness.  Abdominal: Soft. Bowel  sounds are normal. He exhibits no distension. There is no tenderness.  Musculoskeletal: Normal range of motion.  Neurological: He is alert and oriented to person, place, and time. No cranial nerve deficit.  Skin: Skin is warm and dry. No rash noted.  Psychiatric: Mood and affect normal.   LABORATORY PANEL:  Male CBC No results for input(s): WBC, HGB, HCT, PLT in the last 168 hours. ------------------------------------------------------------------------------------------------------------------ Chemistries  Recent Labs  Lab 07/02/17 0258  NA 140  K 4.2  CL 100*  CO2 31  GLUCOSE 164*  BUN 68*  CREATININE 2.33*  CALCIUM 8.5*   RADIOLOGY:  No results found. ASSESSMENT AND PLAN:  76 y.o. caucasian male with chronic kidney disease stage IV baseline creatinine 2.5-3, coronary artery disease, hypertension, hyperlipidemia, left partial nephrectomy, gout, diabetes mellitus type II insulin dependent, diabetic peripheral neuropathy and osteoarthritis [facility]for SOB (shortness of breath)   1. Acute respiratory failure due to Acute on chronic diastolic CHF: required transient vent support on admission  * Acute on CKD 4: progressing to ESRD - Fluid overload improved with temporary hemodialysis.  Hemodialysis catheter removed 06/26/2018. - Tunneled HD cathter placed 06/29/2017 and resumed on HD. - HD Tomorrow - Waiting for outpatient dialysis slot  2. Diabetes mellitus type 2 Continue levemir 9 Units sq BID.  - DM nurse c/s  3. Coronary artery disease: Continue aspirin  4. Hypertension - controlled on Norvasc and metoprolol. Improving  5.  Right knee pain.  No DVT on ultrasound.  Has right Baker's cyst.  Xray showed arthritis and some effusion. Had 3 days of  Prednisone  6. Acute inpatient delirium - resolved, monitor  7 weakness.  Physical therapy evaluation.  SNF at  discharge. CSW aware and working on it.       All the records are  reviewed and case discussed with Care Management/Social Worker. Management plans discussed with the patient, nursing and they are in agreement.  CODE STATUS: Full Code  TOTAL TIME TAKING CARE OF THIS PATIENT: 45 minutes.   More than 50% of the time was spent in counseling/coordination of care: YES  POSSIBLE D/C IN 1-2 DAYS, DEPENDING ON CLINICAL CONDITION. And HD slot, and placement needs   Delfino LovettVipul Shaquel Josephson M.D on 07/05/2017 at 3:58 PM  Between 7am to 6pm - Pager - [phone]  After 6pm go to [email or web address] - Social research officer, governmentpassword EPAS [facility]  [facility] [place] [facility]  Office  [phone]  CC: Primary care physician; System, Provider Not In  Note: This dictation was prepared with Dragon dictation along with smaller phrase technology. Any transcriptional errors that result from this process are unintentional.

## 2017-07-05 NOTE — Progress Notes (Signed)
[facility] [place], [place] 07/05/17  Subjective:  Patient seen at bedside. Wife is here to visit the patient. Urine output recorded yesterday was only 300 cc. Patient due for dialysis again tomorrow.   Objective:  Vital signs in last 24 hours:  Temp:  [97.3 F (36.3 C)-98 F (36.7 C)] 97.7 F (36.5 C) (11/25 0759) Pulse Rate:  [89-100] 100 (11/25 0759) Resp:  [17] 17 (11/25 0406) BP: (116-127)/(68-81) 127/81 (11/25 0759) SpO2:  [96 %-98 %] 96 % (11/25 0759)  Weight change:  Filed Weights   07/03/17 1000 07/03/17 1358 07/04/17 0626  Weight: 84.1 kg (185 lb 6.5 oz) 82.7 kg (182 lb 5.1 oz) 81.1 kg (178 lb 12.8 oz)    Intake/Output:    Intake/Output Summary (Last 24 hours) at 07/05/2017 1143 Last data filed at 07/05/2017 0418 Gross per 24 hour  Intake 0 ml  Output 300 ml  Net -300 ml     Physical Exam: General:  No acute distress  HEENT  hearing intact, OM moist  Neck  supple  Pulm/lungs  scattered rhonchi bilateral normal effort  CVS/Heart  S1S2 no rubs  Abdomen:   Soft NTND BS present  Extremities:  Trace LE edema  Neurologic:   Awake, alert, follows commands  Access:   R IJ permcath          Basic Metabolic Panel:  Recent Labs  Lab 06/29/17 0542 06/30/17 0354 06/30/17 0919 07/01/17 1107 07/02/17 0258  NA 141 141  --  140 140  K 4.5 4.7  --  4.0 4.2  CL 102 103  --  99* 100*  CO2 26 29  --  28 31  GLUCOSE 153* 140*  --  108* 164*  BUN 109* 92*  --  76* 68*  CREATININE 3.18* 2.59*  --  2.41* 2.33*  CALCIUM 8.4* 8.2*  --  8.6* 8.5*  PHOS  --   --  4.6  --   --      CBC: No results for input(s): WBC, NEUTROABS, HGB, HCT, MCV, PLT in the last 168 hours.    Lab Results  Component Value Date   HEPBSAG Negative 06/29/2017      Microbiology:  No results found for this or any previous visit (from the past 240 hour(s)).  Coagulation Studies: No results for input(s): LABPROT, INR in the last 72  hours.  Urinalysis: No results for input(s): COLORURINE, LABSPEC, PHURINE, GLUCOSEU, HGBUR, BILIRUBINUR, KETONESUR, PROTEINUR, UROBILINOGEN, NITRITE, LEUKOCYTESUR in the last 72 hours.  Invalid input(s): APPERANCEUR    Imaging: No results found.   Medications:    . allopurinol  100 mg Oral Daily  . amitriptyline  25 mg Oral QHS  . amLODipine  5 mg Oral Daily  . aspirin  81 mg Per Tube Daily  . feeding supplement (NEPRO CARB STEADY)  237 mL Oral BID BM  . furosemide  40 mg Oral BID  . heparin subcutaneous  5,000 Units Subcutaneous Q8H  . insulin aspart  0-15 Units Subcutaneous TID WC  . insulin aspart  0-5 Units Subcutaneous QHS  . insulin detemir  9 Units Subcutaneous BID AC & HS  . mouth rinse  15 mL Mouth Rinse q12n4p  . metoprolol tartrate  25 mg Oral BID  . multivitamin  1 tablet Oral QHS  . multivitamin with minerals  1 tablet Oral Once per day on Mon Thu  . omega-3 acid ethyl esters  1 g Oral Daily  . sodium chloride flush  3  mL Intravenous Q12H   acetaminophen, HYDROcodone-acetaminophen, ipratropium-albuterol, [DISCONTINUED] ondansetron **OR** ondansetron (ZOFRAN) IV  Assessment/ Plan:  76 y.o. caucasian male with chronic kidney disease stage IV baseline creatinine 2.5-3, coronary artery disease, hypertension, hyperlipidemia, left partial nephrectomy, gout, diabetes mellitus type II insulin dependent, diabetic peripheral neuropathy and osteoarthritis, who was [facility] for SOB (shortness of breath) [R06.02] Hypoxia [R09.02] Acute renal failure, unspecified acute renal failure type (HCC) [N17  1.  Acute renal failure, dialysis reinitiated 06/29/17.  2.  Hyperkalemia 3.  Chronic kidney stage IV 4.  Diabetes type 2 with chronic kidney disease 5.  Acute respiratory failure, previously requiring ventilator support, now on nasal cannula oxygen 6.  Generalized edema  -Patient is followed by Dr. Bland SpanHadley for outpatient chronic kidney disease stage IV.   I did discuss patient's care with Dr. Bland SpanHadley earlier in the week.  He will be assuming the patient's nephrology care upon discharge.  Care management still working upon placement in an outpatient dialysis unit closer to his home.  In addition he will require admission to a rehabilitation center upon discharge.  We will plan for hemodialysis again tomorrow.    LOS: 15 Shaquala Broeker 11/25/201811:43 AM  [address] [facility] [place], [place] [phone]

## 2017-07-06 LAB — PHOSPHORUS: Phosphorus: 5.1 mg/dL — ABNORMAL HIGH (ref 2.5–4.6)

## 2017-07-06 LAB — GLUCOSE, CAPILLARY
Glucose-Capillary: 143 mg/dL — ABNORMAL HIGH (ref 65–99)
Glucose-Capillary: 191 mg/dL — ABNORMAL HIGH (ref 65–99)
Glucose-Capillary: 167 mg/dL — ABNORMAL HIGH (ref 65–99)

## 2017-07-06 LAB — BASIC METABOLIC PANEL
Anion gap: 14 (ref 5–15)
BUN: 72 mg/dL — AB (ref 6–20)
CALCIUM: 8.4 mg/dL — AB (ref 8.9–10.3)
CHLORIDE: 97 mmol/L — AB (ref 101–111)
CO2: 28 mmol/L (ref 22–32)
CREATININE: 2.59 mg/dL — AB (ref 0.61–1.24)
GFR calc Af Amer: 26 mL/min — ABNORMAL LOW (ref >60)
GFR calc non Af Amer: 23 mL/min — ABNORMAL LOW (ref >60)
Glucose, Bld: 131 mg/dL — ABNORMAL HIGH (ref 65–99)
Potassium: 3.9 mmol/L (ref 3.5–5.1)
SODIUM: 139 mmol/L (ref 135–145)

## 2017-07-06 LAB — CBC
HEMATOCRIT: 28.9 % — AB (ref 40.0–52.0)
HEMOGLOBIN: 9.5 g/dL — AB (ref 13.0–18.0)
MCH: 31 pg (ref 26.0–34.0)
MCHC: 32.7 g/dL (ref 32.0–36.0)
MCV: 94.9 fL (ref 80.0–100.0)
Platelets: 346 10*3/uL (ref 150–440)
RBC: 3.04 MIL/uL — ABNORMAL LOW (ref 4.40–5.90)
RDW: 14.2 % (ref 11.5–14.5)
WBC: 9.1 10*3/uL (ref 3.8–10.6)

## 2017-07-06 MED ORDER — INSULIN DETEMIR 100 UNIT/ML ~~LOC~~ SOLN
7.0000 [IU] | Freq: Two times a day (BID) | SUBCUTANEOUS | Status: DC
  Administered 2017-07-06 – 2017-07-07 (×2): 7 [IU] via SUBCUTANEOUS
  Filled 2017-07-06 (×4): qty 0.07

## 2017-07-06 NOTE — Progress Notes (Signed)
HD tx start 

## 2017-07-06 NOTE — Progress Notes (Signed)
Pre HD assessment  

## 2017-07-06 NOTE — Progress Notes (Signed)
Inpatient Diabetes Program Recommendations  AACE/ADA: New Consensus Statement on Inpatient Glycemic Control (2015)  Target Ranges:  Prepandial:   less than 140 mg/dL      Peak postprandial:   less than 180 mg/dL (1-2 hours)      Critically ill patients:  140 - 180 mg/dL    Results for Maralyn SagoMOORE, Jmari T (MRN [id number]) as of 07/06/2017 11:12  Ref. Range 07/05/2017 08:01 07/05/2017 12:02 07/05/2017 16:57 07/05/2017 21:41  Glucose-Capillary Latest Ref Range: 65 - 99 mg/dL 87 829218 (H) 562133 (H) 130141 (H)   Results for Maralyn SagoMOORE, Guadalupe T (MRN [id number]) as of 07/06/2017 11:12  Ref. Range 07/06/2017 07:48  Glucose-Capillary Latest Ref Range: 65 - 99 mg/dL 295143 (H)    Home DM Meds: NPH Insulin- 10 units BID  Current Insulin Orders: Levemir 9 units BID      Novolog Moderate Correction Scale/ SSI (0-15 units) TID AC + HS      MD- Note patient did not receive AM dose of Levemir yesterday AM (11/25).  Not sure why it was held??  CBG yesterday AM was 87 mg/dl.  CBG this AM: 143 mg/dl.  Please consider reducing Levemir slightly to 7 units BID      --Will follow patient during hospitalization--  Ambrose FinlandJeannine Johnston Luv Mish RN, MSN, CDE Diabetes Coordinator Inpatient Glycemic Control Team Team Pager: [phone] (8a-5p)

## 2017-07-06 NOTE — Care Management (Signed)
Informed that patient has been assigned a chair an time at dialysis center and has insurance Berkley Harveyauth has been received per Dimas ChyleAmanda Morris with [employer].  [facility]. [address] [place] [address]  [phone].  Informed that patient sat for his dialysis treatment on 7/24.  Discussed with Delena ServeDavita that it is documented that patient's vital signs were assessed in "lying position." Patient was sent down in the bed again today  and it is contraindicated for patient to be transferred to a chair once his treatment has started.  It has been discussed during progression since patient had the perm cath inserted 11/19 that patient needed to be sitting for his dialysis treatments.  Patient will require a  physical therapy assessment today to be sent to insurance company today before will provide authorization for skilled nursing stay. Not having documentation that has not sat for dialysis treatment and need for additional physical therapy information for insurance approval for snf will delay discharge.  Patient was in dialysis this morning and not available for physical therapy to see.  Spoke with primary nurse regarding need for PT treatment today.  CM reaching out to assigned therapist for heads up also.  Patient's HD session at present will run between 3 - 3.5 hours. Will receive a 3 hours treatment tomorrow which will need to  scheduled as early as possible 11/27 .  Spoke with Melissa in the dialysis center and it is documented patient should sit for the treatment

## 2017-07-06 NOTE — Progress Notes (Signed)
Physical Therapy Treatment Patient Details Name: Christopher [name] MRN: [id number] DOB: 1941-02-19 Today's Date: 07/06/2017    History of Present Illness 76 y.o. male presenting to hospital 06/20/17 with SOB (pt with significant SOB coming back from fishing trip; O2 noted to be 67% and EMS initiated CPAP).  Pt admitted to hospital with acute on chronic kidney disease (fluid overloaded resulting in hypoxia).  Pt transferred to CCU 06/21/17 secondary to unresponsiveness and respiratory distress.  Pt diagnosed with acute hypoxic/hypercarbic respiratory failure, acute pulmonary edema, acute CHF exacerbation, and acute on chronic renal failure.  Pt intubated 06/21/17 and extubated 06/24/17.  CRRT initiated 06/21/17 and discontinued 06/23/17, pt was receiving HD via temporary R HD fem cath, since removed.  PMH includes anxiety, CKD, htn, MI, anginal pain, chronic back pain, DM, anterior fusion c-spine, B RCR, and L TKA.    PT Comments    Pt presents with deficits in strength, transfers, mobility, gait, balance, and activity tolerance.  Pt required Mod A with rolling and sup to/from sit bed mobility training along with verbal and tactile cues for proper sequencing.  Pt's sitting balance improved grossly this session with pt able to maintain sitting balance at EOB immediately upon coming from supine to sit position.  Pt able to stand from elevated EOB with +2 assist for safety but only mod A to come up to standing.  Pt able to take 1-2 very small steps backward towards EOB in standing with Min A to prevent posterior LOB, SpO2 and HR WFL during session on 2LO2/min.  Pt requested session to end early secondary to requiring BM with nursing assisting pt to bedpan.  Pt slowly progressing towards goals but remains functionally very weak with very poor activity tolerance.  Pt will benefit from PT services in a SNF setting upon discharge to safely address above deficits for decreased caregiver assistance and eventual return  to PLOF.     Follow Up Recommendations  SNF     Equipment Recommendations  Rolling walker with 5" wheels(TBD at next venue of care if discharges to SNF)    Recommendations for Other Services       Precautions / Restrictions Precautions Precautions: Fall Precaution Comments: Monitor O2 Restrictions Weight Bearing Restrictions: No    Mobility  Bed Mobility Overal bed mobility: Needs Assistance Bed Mobility: Supine to Sit;Sit to Supine     Supine to sit: Mod assist Sit to supine: Mod assist   General bed mobility comments: Mod verbal and tactile cues for sequencing  Transfers Overall transfer level: Needs assistance Equipment used: Rolling walker (2 wheeled) Transfers: Sit to/from Stand Sit to Stand: +2 safety/equipment;Mod assist         General transfer comment: Max verbal and tactile cues for sequencing, constant min A in standing to prevent posterior LOB  Ambulation/Gait Ambulation/Gait assistance: Min assist Ambulation Distance (Feet): 1 Feet Assistive device: Rolling walker (2 wheeled) Gait Pattern/deviations: Step-to pattern;Trunk flexed     General Gait Details: Pt able to take 1-2 very small steps backward towards EOB in standing with Min A to prevent posterior LOB, SpO2 and HR WFL during session on 2LO2/min   Stairs            Wheelchair Mobility    Modified Rankin (Stroke Patients Only)       Balance Overall balance assessment: Needs assistance Sitting-balance support: Feet unsupported;Feet supported;Bilateral upper extremity supported Sitting balance-Leahy Scale: Fair Sitting balance - Comments: Pt able to maintain seated balance upon sitting to EOB from  supine without assistance required to prevent LOB   Standing balance support: Bilateral upper extremity supported Standing balance-Leahy Scale: Poor Standing balance comment: Constant min A required in standing to prevent posterior LOB                             Cognition Arousal/Alertness: Awake/alert Behavior During Therapy: WFL for tasks assessed/performed Overall Cognitive Status: Within Functional Limits for tasks assessed                                 General Comments: Slow to respond to questions and appears confused at times but pleasant and willing to participate      Exercises Total Joint Exercises Ankle Circles/Pumps: AROM;Both;10 reps    General Comments        Pertinent Vitals/Pain Pain Assessment: No/denies pain    Home Living                      Prior Function            PT Goals (current goals can now be found in the care plan section) Progress towards PT goals: Progressing toward goals    Frequency    Min 2X/week      PT Plan Current plan remains appropriate    Co-evaluation              AM-PAC PT "6 Clicks" Daily Activity  Outcome Measure                   End of Session Equipment Utilized During Treatment: Gait belt;Oxygen Activity Tolerance: Patient limited by fatigue Patient left: in bed;with call bell/phone within reach;with nursing/sitter in room;with bed alarm set(Nsg assisting pt to bedpan) Nurse Communication: Mobility status PT Visit Diagnosis: Other abnormalities of gait and mobility (R26.89);History of falling (Z91.81);Muscle weakness (generalized) (M62.81)     Time: 1535-1550 PT Time Calculation (min) (ACUTE ONLY): 15 min  Charges:  $Therapeutic Activity: 8-22 mins                    G Codes:       Elly Modena. Scott Alann Avey PT, DPT 07/06/17, 4:53 PM

## 2017-07-06 NOTE — Progress Notes (Signed)
[employer] - [place] [facility]   PATIENT NAME: Christopher GambleRalph [name]    MR#:  [id number]  DATE OF BIRTH:  1941-01-18  SUBJECTIVE:  CHIEF COMPLAINT:   Chief Complaint  Patient presents with  . Shortness of Breath  seen at HD, no complaints REVIEW OF SYSTEMS:  Review of Systems  Constitutional: Negative for chills, fever and weight loss.  HENT: Negative for nosebleeds and sore throat.   Eyes: Negative for blurred vision.  Respiratory: Negative for cough, shortness of breath and wheezing.   Cardiovascular: Negative for chest pain, orthopnea, leg swelling and PND.  Gastrointestinal: Negative for abdominal pain, constipation, diarrhea, heartburn, nausea and vomiting.  Genitourinary: Negative for dysuria and urgency.  Musculoskeletal: Negative for back pain.  Skin: Negative for rash.  Neurological: Negative for dizziness, speech change, focal weakness and headaches.  Endo/Heme/Allergies: Does not bruise/bleed easily.  Psychiatric/Behavioral: Negative for depression.   DRUG ALLERGIES:   Allergies  Allergen Reactions  . Neurontin [Gabapentin] Other (See Comments)    Swelling when taking everyday   VITALS:  Blood pressure 105/69, pulse (!) 110, temperature 98.2 F (36.8 C), temperature source Oral, resp. rate (!) 28, height 5\' 8"  (1.727 m), weight 80.7 kg (177 lb 14.6 oz), SpO2 98 %. PHYSICAL EXAMINATION:  Physical Exam  Constitutional: He is oriented to person, place, and time and well-developed, well-nourished, and in no distress.  HENT:  Head: Normocephalic and atraumatic.  Eyes: Conjunctivae and EOM are normal. Pupils are equal, round, and reactive to light.  Neck: Normal range of motion. Neck supple. No tracheal deviation present. No thyromegaly present.  Cardiovascular: Normal rate, regular rhythm and normal heart sounds.  Pulmonary/Chest: Effort normal and breath sounds normal. No respiratory distress. He has no wheezes. He exhibits no tenderness.    Abdominal: Soft. Bowel sounds are normal. He exhibits no distension. There is no tenderness.  Musculoskeletal: Normal range of motion.  Neurological: He is alert and oriented to person, place, and time. No cranial nerve deficit.  Skin: Skin is warm and dry. No rash noted.  Psychiatric: Mood and affect normal.   LABORATORY PANEL:  Male CBC Recent Labs  Lab 07/06/17 1002  WBC 9.1  HGB 9.5*  HCT 28.9*  PLT 346   ------------------------------------------------------------------------------------------------------------------ Chemistries  Recent Labs  Lab 07/06/17 1002  NA 139  K 3.9  CL 97*  CO2 28  GLUCOSE 131*  BUN 72*  CREATININE 2.59*  CALCIUM 8.4*   RADIOLOGY:  No results found. ASSESSMENT AND PLAN:  76 y.o. caucasian male with chronic kidney disease stage IV baseline creatinine 2.5-3, coronary artery disease, hypertension, hyperlipidemia, left partial nephrectomy, gout, diabetes mellitus type II insulin dependent, diabetic peripheral neuropathy and osteoarthritis [facility]for SOB (shortness of breath)   1. Acute respiratory failure due to Acute on chronic diastolic CHF: required transient vent support on admission  * ESRD - Fluid overload improved with temporary hemodialysis.  Hemodialysis catheter removed 06/26/2018. - Tunneled HD cathter placed 06/29/2017 and resumed on HD. - HD Today with plan for D/C tomorrow - Waiting for outpatient dialysis slot  2. Diabetes mellitus type 2 Continue levemir 7 Units sq BID.  - DM nurse following  3. Coronary artery disease: Continue aspirin  4. Hypertension - controlled on Norvasc and metoprolol. Improving  5.  Right knee pain.  No DVT on ultrasound.  Has right Baker's cyst.  Xray showed arthritis and some effusion.  6. Acute inpatient delirium - resolved, monitor  7. weakness.  Physical  therapy evaluation.  SNF at discharge. CSW aware and working on it.       All the records  are reviewed and case discussed with Care Management/Social Worker. Management plans discussed with the patient, nursing and they are in agreement.  CODE STATUS: Full Code  TOTAL TIME TAKING CARE OF THIS PATIENT: 45 minutes.   More than 50% of the time was spent in counseling/coordination of care: YES  POSSIBLE D/C IN 1-2 DAYS, DEPENDING ON CLINICAL CONDITION. And HD slot, and placement needs   Delfino LovettVipul Dannisha Eckmann M.D on 07/06/2017 at 4:24 PM  Between 7am to 6pm - Pager - [phone]  After 6pm go to [email or web address] - Social research officer, governmentpassword EPAS [facility]  [facility] [place] [facility]  Office  [phone]  CC: Primary care physician; System, Provider Not In  Note: This dictation was prepared with Dragon dictation along with smaller phrase technology. Any transcriptional errors that result from this process are unintentional.

## 2017-07-06 NOTE — Progress Notes (Signed)
Post HD assessment  

## 2017-07-06 NOTE — Clinical Social Work Note (Signed)
CSW spoke with Zella BallRobin at [facility] in [place]: [phone] and they will be able to take patient if patient is ready for discharge tomorrow. CSW will need PT to reassess due to insurance requiring a more recent note. Prior Berkley Harveyauth [facility] been [facility]. York SpanielMonica Emersen Mascari MSW,LCSW [phone]

## 2017-07-06 NOTE — Progress Notes (Signed)
HD tx end  

## 2017-07-06 NOTE — Plan of Care (Signed)
  Clinical Measurements: Ability to maintain clinical measurements within normal limits will improve 07/06/2017 1809 - Not Progressing by [name], [name], RN Note Patient's GFR today = only 26. Will continue to monitor renal function on hemodialysis. [name] [facility]

## 2017-07-06 NOTE — Progress Notes (Signed)
[facility]  ROUNDING NOTE   Subjective:   Seen and examined on hemodialysis. Tolerating treatment well.     HEMODIALYSIS FLOWSHEET:  Blood Flow Rate (mL/min): 400 mL/min Arterial Pressure (mmHg): -210 mmHg Venous Pressure (mmHg): 140 mmHg Transmembrane Pressure (mmHg): 50 mmHg Ultrafiltration Rate (mL/min): 430 mL/min Dialysate Flow Rate (mL/min): 800 ml/min Conductivity: Machine : 14.1 Conductivity: Machine : 14.1 Dialysis Fluid Bolus: Normal Saline Bolus Amount (mL): 250 mL Dialysate Change: 2K   Objective:  Vital signs in last 24 hours:  Temp:  [97.3 F (36.3 C)-98.2 F (36.8 C)] 97.3 F (36.3 C) (11/26 0910) Pulse Rate:  [92-97] 96 (11/26 0930) Resp:  [15-22] 15 (11/26 0930) BP: (110-145)/(67-80) 129/80 (11/26 0930) SpO2:  [98 %-100 %] 100 % (11/26 0930) Weight:  [81.2 kg (179 lb 0.2 oz)] 81.2 kg (179 lb 0.2 oz) (11/26 0910)  Weight change:  Filed Weights   07/03/17 1358 07/04/17 0626 07/06/17 0910  Weight: 82.7 kg (182 lb 5.1 oz) 81.1 kg (178 lb 12.8 oz) 81.2 kg (179 lb 0.2 oz)    Intake/Output: I/O last 3 completed shifts: In: 240 [P.O.:240] Out: 1000 [Urine:1000]   Intake/Output this shift:  No intake/output data recorded.  Physical Exam: General: NAD, laying in bed  Head: Normocephalic, atraumatic. Moist oral mucosal membranes  Eyes: Anicteric, PERRL  Neck: Supple, trachea midline  Lungs:  Clear to auscultation  Heart: Regular rate and rhythm  Abdomen:  Soft, nontender,   Extremities: no peripheral edema.  Neurologic: Nonfocal, moving all four extremities  Skin: No lesions  Access: RIJ permcath    Basic Metabolic Panel: Recent Labs  Lab 06/30/17 0354 06/30/17 0919 07/01/17 1107 07/02/17 0258  NA 141  --  140 140  K 4.7  --  4.0 4.2  CL 103  --  99* 100*  CO2 29  --  28 31  GLUCOSE 140*  --  108* 164*  BUN 92*  --  76* 68*  CREATININE 2.59*  --  2.41* 2.33*  CALCIUM 8.2*  --  8.6* 8.5*  PHOS  --  4.6  --   --      Liver Function Tests: No results for input(s): AST, ALT, ALKPHOS, BILITOT, PROT, ALBUMIN in the last 168 hours. No results for input(s): LIPASE, AMYLASE in the last 168 hours. No results for input(s): AMMONIA in the last 168 hours.  CBC: No results for input(s): WBC, NEUTROABS, HGB, HCT, MCV, PLT in the last 168 hours.  Cardiac Enzymes: No results for input(s): CKTOTAL, CKMB, CKMBINDEX, TROPONINI in the last 168 hours.  BNP: Invalid input(s): POCBNP  CBG: Recent Labs  Lab 07/05/17 0801 07/05/17 1202 07/05/17 1657 07/05/17 2141 07/06/17 0748  GLUCAP 87 218* 133* 141* 143*    Microbiology: Results for orders placed or performed during the hospital encounter of 06/20/17  MRSA PCR Screening     Status: None   Collection Time: 06/21/17  5:00 AM  Result Value Ref Range Status   MRSA by PCR NEGATIVE NEGATIVE Final    Comment:        The GeneXpert MRSA Assay (FDA approved for NASAL specimens only), is one component of a comprehensive MRSA colonization surveillance program. It is not intended to diagnose MRSA infection nor to guide or monitor treatment for MRSA infections.   Culture, blood (Routine X 2) w Reflex to ID Panel     Status: None   Collection Time: 06/21/17  8:00 AM  Result Value Ref Range Status   Specimen Description  BLOOD RIGHT ANTECUBITAL  Final   Special Requests   Final    BOTTLES DRAWN AEROBIC AND ANAEROBIC Blood Culture results may not be optimal due to an excessive volume of blood received in culture bottles   Culture NO GROWTH 5 DAYS  Final   Report Status 06/26/2017 FINAL  Final  Culture, blood (Routine X 2) w Reflex to ID Panel     Status: None   Collection Time: 06/21/17  8:04 AM  Result Value Ref Range Status   Specimen Description BLOOD BLOOD RIGHT FOREARM  Final   Special Requests   Final    BOTTLES DRAWN AEROBIC AND ANAEROBIC Blood Culture results may not be optimal due to an excessive volume of blood received in culture bottles    Culture NO GROWTH 5 DAYS  Final   Report Status 06/26/2017 FINAL  Final    Coagulation Studies: No results for input(s): LABPROT, INR in the last 72 hours.  Urinalysis: No results for input(s): COLORURINE, LABSPEC, PHURINE, GLUCOSEU, HGBUR, BILIRUBINUR, KETONESUR, PROTEINUR, UROBILINOGEN, NITRITE, LEUKOCYTESUR in the last 72 hours.  Invalid input(s): APPERANCEUR    Imaging: No results found.   Medications:    . allopurinol  100 mg Oral Daily  . amitriptyline  25 mg Oral QHS  . amLODipine  5 mg Oral Daily  . aspirin  81 mg Per Tube Daily  . feeding supplement (NEPRO CARB STEADY)  237 mL Oral BID BM  . furosemide  40 mg Oral BID  . heparin subcutaneous  5,000 Units Subcutaneous Q8H  . insulin aspart  0-15 Units Subcutaneous TID WC  . insulin aspart  0-5 Units Subcutaneous QHS  . insulin detemir  9 Units Subcutaneous BID AC & HS  . mouth rinse  15 mL Mouth Rinse q12n4p  . metoprolol tartrate  25 mg Oral BID  . multivitamin  1 tablet Oral QHS  . multivitamin with minerals  1 tablet Oral Once per day on Mon Thu  . omega-3 acid ethyl esters  1 g Oral Daily  . sodium chloride flush  3 mL Intravenous Q12H   acetaminophen, HYDROcodone-acetaminophen, ipratropium-albuterol, [DISCONTINUED] ondansetron **OR** ondansetron (ZOFRAN) IV  Assessment/ Plan:  Christopher [name] is a 76 y.o. white male with chronic kidney disease stage IV baseline creatinine 2.5-3, coronary artery disease, hypertension, hyperlipidemia, left partial nephrectomy, gout, diabetes mellitus type II insulin dependent, diabetic peripheral neuropathy and osteoarthritis   1. End Stage Renal Disease: seen and examined on hemodialysis. Dialysis first treatment 11/19. RIJ permcath Outpatient planning. [place], [place]. Follow[facility] with Dr[facility] Bland SpanHadley, [facility].   2. Hypertension: hypotensive on treatment - amlodipine, furosemide, and metoprolol  3. Anemia of chronic kidney disease: hemoglobin 9.7 on 11/17 -  no recent CBC.  - not currently on ESA therapy.  - CBC today.   4. Secondary Hyperparathyroidism: phosphorus and calcium at goal. No PTH level available.  - not currently on binder or vitamin D agent.   5. Diabetes mellitus type II with chronic kidney disease: insulin dependent. Hemogloin A1c 7.3% on admission.  - Continue glucose control.    LOS: 16 Marcin Holte 11/26/20189:53 AM

## 2017-07-06 NOTE — Progress Notes (Signed)
OT Cancellation Note  Patient Details Name: Christopher [name] MRN: [id number] DOB: 12-31-1940   Cancelled Treatment:    Reason Eval/Treat Not Completed: Patient at procedure or test/ unavailable. Pt unavailable this am for dialysis. Will re-attempt OT treatment in afternoon as pt is available.  Richrd PrimeJamie Stiller, MPH, MS, OTR/L ascom [phone] 07/06/17, 9:41 AM

## 2017-07-06 NOTE — Progress Notes (Signed)
Nutrition Follow-up  DOCUMENTATION CODES:   Obesity unspecified  INTERVENTION:  Recommend check vitamin D labs   Nepro Shake po BID, each supplement provides 425 kcal and 19 grams protein  MVI twice weekly  Renal MVI daily   NUTRITION DIAGNOSIS:   Inadequate oral intake related to decreased appetite as evidenced by per patient/family report.  -improving  GOAL:   Patient will meet greater than or equal to 90% of their needs  Progressing.   MONITOR:   PO intake, Supplement acceptance, Labs, Weight trends, I & O's  ASSESSMENT:   76 year old male with PMHx of HTN, anxiety, CAD, hx MI 2003, HLD, DM type 2, chronic back pain, chronic kidney disease stage IV, hx partial nephrectomy in 2000 who presented with shortness of breath found to have acute on chronic kidney disease with volume overload resulting in hypoxia. Rapid response was called 11/11 due to low oxygen saturation and decreased responsiveness and was emergently intubated.   Pt s/p RIJ placement 11/19.  Pt's continues to do well; eating 50-100% of meals and drinking Nepro. Pt tolerating HD well; last treatment was today. Per chart, pt with 43lb weight loss since admit. Pt's weight has stabilized over the past 3 days. Recommend check vitamin D labs. Phosphorus levels improved. Continue MVIs and Nepro. Pt awaiting outpatient HD placement.     Medications reviewed and include: allopurinol, aspirin, lasix, heparin, insulin, Omega 3, MVI, renal MVI  Labs reviewed: Cl 97(L), BUN 72(H), creat 2.59(H), Ca 8.4(L), P 5.1(H) BNP- 404(H)- 11/10 Hgb 9.5(L), Hct 28.9(L)  Diet Order:  Diet renal/carb modified with fluid restriction Diet-HS Snack? Nothing; Room service appropriate? Yes; Fluid consistency: Thin  EDUCATION NEEDS:   No education needs have been identified at this time  Skin:  Reviewed RN Assessment(abrasions to legs and knees)  Last BM:  11/24  Height:   Ht Readings from Last 1 Encounters:  06/29/17 5\' 8"   (1.727 m)    Weight:   Wt Readings from Last 1 Encounters:  07/06/17 179 lb 0.2 oz (81.2 kg)    Ideal Body Weight:  70 kg  BMI:  Body mass index is 27.22 kg/m.  Estimated Nutritional Needs:   Kcal:  2075-2250 (MSJ x 1.2-1.3)  Protein:  100-120 grams (1-1.2 grams/kg)  Fluid:  1.7-2.1 L/day (25-30 mL/kg IBW)  Betsey Holidayasey Shauntee Karp MS, RD, LDN Pager #- [phone] After Hours Pager: [phone]

## 2017-07-07 LAB — GLUCOSE, CAPILLARY
Glucose-Capillary: 128 mg/dL — ABNORMAL HIGH (ref 65–99)
Glucose-Capillary: 112 mg/dL — ABNORMAL HIGH (ref 65–99)

## 2017-07-07 MED ORDER — NEPRO/CARBSTEADY PO LIQD: 237.0000 mL | Freq: Two times a day (BID) | ORAL | 0 refills | Status: AC

## 2017-07-07 MED ORDER — AMLODIPINE BESYLATE 5 MG PO TABS: 5.0000 mg | ORAL_TABLET | Freq: Every day | ORAL | 0 refills | Status: AC

## 2017-07-07 MED ORDER — FUROSEMIDE 40 MG PO TABS: 40.0000 mg | ORAL_TABLET | Freq: Two times a day (BID) | ORAL | 0 refills | Status: AC

## 2017-07-07 MED ORDER — AMITRIPTYLINE HCL 25 MG PO TABS: 25.0000 mg | ORAL_TABLET | Freq: Every day | ORAL | 0 refills | Status: AC

## 2017-07-07 NOTE — Progress Notes (Signed)
Report called to Vernona RiegerLaura, LPN at [facility]. [facility] has been called.awaiting transportation.

## 2017-07-07 NOTE — Care Management (Signed)
CM informed that patient sat for entire HD treatment today without problems.  Required hoyer lift to get into the chair.  Dialysis schedule TTS 6:45A.

## 2017-07-07 NOTE — Progress Notes (Signed)
Christopher [name] to be D/C'd [facility]illed nursing [facility] per MD order. Report called to Skyline ViewNicole, LPN. Discussed prescriptions and follow up appointments with the patient and wife. Prescriptions given to patient, medication list explained in detail. Pt verbalized understanding.  Allergies as of 07/07/2017      Reactions   Neurontin [gabapentin] Other (See Comments)   Swelling when taking everyday      Medication List    STOP taking these medications   potassium chloride SA 20 MEQ tablet Commonly known as:  K-DUR,KLOR-CON     TAKE these medications   allopurinol 100 MG tablet Commonly known as:  ZYLOPRIM Take 1 tablet daily by mouth.   amitriptyline 25 MG tablet Commonly known as:  ELAVIL Take 1 tablet (25 mg total) by mouth at bedtime. What changed:    medication strength  how much to take   amLODipine 5 MG tablet Commonly known as:  NORVASC Take 1 tablet (5 mg total) by mouth daily. What changed:    medication strength  how much to take   aspirin EC 325 MG tablet Take 81 mg by mouth daily.   feeding supplement (NEPRO CARB STEADY) Liqd Take 237 mLs by mouth 2 (two) times daily between meals.   Fish Oil 1200 MG Caps Take 1 capsule by mouth daily.   furosemide 40 MG tablet Commonly known as:  LASIX Take 1 tablet (40 mg total) by mouth 2 (two) times daily. What changed:  when to take this   gabapentin 600 MG tablet Commonly known as:  NEURONTIN Take 600 mg by mouth Three times daily as needed. For nerve pain   insulin NPH Human 100 UNIT/ML injection Commonly known as:  HUMULIN N,NOVOLIN N Inject 10 Units 2 (two) times daily into the skin.   metoprolol succinate 100 MG 24 hr tablet Commonly known as:  TOPROL-XL Take 25 mg by mouth daily.   multivitamin tablet Take 1 tablet by mouth daily.       Vitals:   07/07/17 1346 07/07/17 1720  BP: 106/63 138/75  Pulse: (!) 107 (!) 104  Resp: 18 19  Temp: 98.3 F (36.8 C) 98.2  F (36.8 C)  SpO2: 100% 98%    Tele box removed and returned.Skin clean, dry and intact without evidence of skin break down, no evidence of skin tears noted. IV catheter discontinued intact. Site without signs and symptoms of complications. Dressing and pressure applied. Pt denies pain at this time. No complaints noted.  An After Visit Summary was printed and given to the patient. Patient escorted via [facility].  [name]

## 2017-07-07 NOTE — Discharge Instructions (Signed)
End-Stage Kidney Disease °End-stage kidney disease occurs when the kidneys are so damaged that they cannot do their job. The kidneys are two organs that do many important jobs in the body, which include: °· Removing wastes and extra fluids from the blood. °· Making hormones that maintain the amount of fluid in your tissues and blood vessels. °· Maintaining the right amount of fluids and chemicals in the body. ° °When the kidneys are damaged and cannot do their job, life-threatening problems occur. Without the help of the kidneys, toxins build up in the blood. In end-stage kidney disease, the kidneys cannot get better. °What are the causes? °End-stage kidney disease usually occurs when a long-lasting (chronic) kidney disease gets worse. It may also occur after the kidneys are suddenly damaged (acute kidney injury). °What increases the risk? °This condition is more likely to develop in people who are: °· Older than age 60. °· Male. °· Of African-American descent. °· Current smokers or former smokers. °· Obese. ° °You may also have an increased risk for end-stage kidney disease if you: °· Have a family history of chronic kidney disease (CKD). °· Have had kidney disease for many years. °· Have other longstanding medical conditions that affect the kidneys, such as: °? Cardiovascular disease including high blood pressure. °? Diabetes. °? Certain diseases that affect the immune system. ° °What are the signs or symptoms? °· Swelling (edema) of the face, legs, ankles, or feet. °· Numbness, tingling, or loss of feeling (sensation) in your hands or feet. °· Tiredness (lethargy). °· Nausea or vomiting. °· Confusion, trouble concentrating, or loss of consciousness. °· Chest pain. °· Shortness of breath. °· Little to no urine production. °· Muscle twitches and cramps, especially in the legs. °· Constant itchiness. °· Loss of appetite. °· Pale skin and tissue lining your eyelids (conjunctiva). °· Headaches. °· Abnormally dark or  light skin. °· Decrease in muscle size (muscle wasting). °· Easy bruising. °· Frequent hiccups. °· Stopping of menstruation in women. °· Seizures. °How is this diagnosed? °Your health care provider will measure your blood pressure and do some tests. These may include: °· Urine tests. °· Blood tests. °· Imaging tests. °· A test in which a sample of tissue is removed from the kidneys to be looked at under a microscope (kidney biopsy). ° °How is this treated? °There are two treatments for end-stage kidney disease: °· A procedure that removes toxic wastes from the body (dialysis). Depending on the type of dialysis you choose, it may be performed more than one time a day (peritoneal dialysis) or several times a week (hemodialysis). °· Surgery to receive a new kidney (kidney transplant). ° °In addition to having dialysis or a kidney transplant, you may need to take medicines: °· To control high blood pressure (hypertension). °· To control cholesterol. °· To maintain healthy electrolyte levels in your blood. ° °You may also be given a specific diet to follow that includes requirements or limits for: °· Salt (sodium). °· Protein. °· Phosphorous. °· Potassium. °· Calcium. ° °Follow these instructions at home: °· Follow your prescribed diet. °· Take over-the-counter and prescription medicines only as told by your health care provider. °? Do not take any new medicines unless approved by your health care provider. Many medicines can worsen your kidney damage. °? Do not take any vitamin and mineral supplements unless approved by your health care provider. Many nutritional supplements can worsen your kidney damage. °? The dose of some medicines that you take may need to be   adjusted. °· Do not use any tobacco products, such as cigarettes, chewing tobacco, and e-cigarettes. If you need help quitting, ask your health care provider. °· Keep all follow-up visits as told by your health care provider. This is important. °· Keep track of  your blood pressure. Report changes in your blood pressure as told by your health care provider. °· Achieve and maintain a healthy weight. If you need help with this, ask your health care provider. °· Start or continue an exercise plan. Try to exercise at least 30 minutes a day, 5 days a week. °· Stay current with immunizations as told by your health care provider. °Where to find more information: °· American Association of Kidney Patients: www.aakp.org °· National Kidney Foundation: www.kidney.org °· American Kidney Fund: www.akfinc.org °· Life Options Rehabilitation Program: www.lifeoptions.org and www.kidneyschool.org °Contact a health care provider if: °· Your symptoms get worse. °· You develop new symptoms. °Get help right away if: °· You have weakness in an arm or leg on one side of your body. °· You have difficulty speaking or you are slurring your speech. °· You have a sudden change in your vision. °· You have a sudden, severe headache. °· You have a sudden weight increase. °· You have difficulty breathing. °· Your symptoms suddenly get worse. °This information is not intended to replace advice given to you by your health care provider. Make sure you discuss any questions you have with your health care provider. °Document Released: 10/18/2003 Document Revised: 01/03/2016 Document Reviewed: 03/26/2012 °Elsevier Interactive Patient Education © 2017 [employer] Inc. ° °

## 2017-07-07 NOTE — Clinical Social Work Note (Signed)
CSW received auth from [facility] at 2:50pm this afternoon after calling them 4 times today to get updates. Auth received: [id number] and provided to Robin at [facility]. Discharge information sent. Patient's wife aware of discharge and states that she prefers he transport via EMS.

## 2017-07-07 NOTE — Clinical Social Work Placement (Signed)
   CLINICAL SOCIAL WORK PLACEMENT  NOTE  Date:  07/07/2017  Patient Details  Name: Christopher [name] MRN: [id number] Date of Birth: [birth year]  Clinical Social Work is seeking post-discharge placement for this patient at the Skilled  Nursing Facility level of care (*CSW will initial, date and re-position this form in  chart as items are completed):  Yes   Patient/family provided with [facility] Clinical Social Work Department's list of facilities offering this level of care within the geographic area requested by the patient (or if unable, by the patient's family).  Yes   Patient/family informed of their freedom to choose among providers that offer the needed level of care, that participate in Medicare, Medicaid or managed care program needed by the patient, have an available bed and are willing to accept the patient.  Yes   Patient/family informed of [facility]'s ownership interest in [facility] and [facility], as well as of the fact that they are under no obligation to receive care at these facilities.  PASRR submitted to EDS on 06/26/17     PASRR number received on 06/26/17     Existing PASRR number confirmed on       FL2 transmitted to all facilities in geographic area requested by pt/family on 06/26/17     FL2 transmitted to all facilities within larger geographic area on       Patient informed that his/her managed care company has contracts with or will negotiate with certain facilities, including the following:        Yes   Patient/family informed of bed offers received.  Patient chooses bed at [facility]     Physician recommends and patient chooses bed at [facility](SNF)    Patient to be transferred to [facility] on 07/07/17.  Patient to be transferred to facility by (EMS)     Patient family notified on 07/07/17 of transfer.  Name of family member notified:  (wife)     PHYSICIAN Please sign FL2     Additional Comment:     _______________________________________________ York SpanielMonica Jacqui Headen, LCSW 07/07/2017, 3:18 PM

## 2017-07-07 NOTE — Progress Notes (Signed)
Pre HD  

## 2017-07-07 NOTE — Progress Notes (Signed)
[facility]  ROUNDING NOTE   Subjective:   Seen and examined on hemodialysis. Tolerating treatment well. Seated in a chair during treatment.     HEMODIALYSIS FLOWSHEET:  Blood Flow Rate (mL/min): 225 mL/min Arterial Pressure (mmHg): -90 mmHg Venous Pressure (mmHg): (increased V-Pressure) Transmembrane Pressure (mmHg): 50 mmHg Ultrafiltration Rate (mL/min): 300 mL/min Dialysate Flow Rate (mL/min): 800 ml/min Conductivity: Machine : 14.1 Conductivity: Machine : 14.1 Dialysis Fluid Bolus: Normal Saline Bolus Amount (mL): 250 mL Dialysate Change: 2K   Objective:  Vital signs in last 24 hours:  Temp:  [97.5 F (36.4 C)-98.2 F (36.8 C)] 98 F (36.7 C) (11/27 0742) Pulse Rate:  [87-114] 87 (11/27 0742) Resp:  [16-32] 16 (11/27 0742) BP: (83-143)/(62-74) 115/64 (11/27 0742) SpO2:  [95 %-100 %] 96 % (11/27 0742) Weight:  [77.3 kg (170 lb 8 oz)-80.7 kg (177 lb 14.6 oz)] 77.3 kg (170 lb 8 oz) (11/27 0553)  Weight change:  Filed Weights   07/06/17 0910 07/06/17 1314 07/07/17 0553  Weight: 81.2 kg (179 lb 0.2 oz) 80.7 kg (177 lb 14.6 oz) 77.3 kg (170 lb 8 oz)    Intake/Output: I/O last 3 completed shifts: In: 120 [P.O.:120] Out: 1029 [Urine:750; Other:279]   Intake/Output this shift:  No intake/output data recorded.  Physical Exam: General: NAD, laying in bed  Head: Normocephalic, atraumatic. Moist oral mucosal membranes  Eyes: Anicteric, PERRL  Neck: Supple, trachea midline  Lungs:  Clear to auscultation  Heart: Regular rate and rhythm  Abdomen:  Soft, nontender,   Extremities: no peripheral edema.  Neurologic: Nonfocal, moving all four extremities  Skin: No lesions  Access: RIJ permcath    Basic Metabolic Panel: Recent Labs  Lab 07/01/17 1107 07/02/17 0258 07/06/17 1002  NA 140 140 139  K 4.0 4.2 3.9  CL 99* 100* 97*  CO2 28 31 28   GLUCOSE 108* 164* 131*  BUN 76* 68* 72*  CREATININE 2.41* 2.33* 2.59*  CALCIUM 8.6* 8.5* 8.4*  PHOS  --    --  5.1*    Liver Function Tests: No results for input(s): AST, ALT, ALKPHOS, BILITOT, PROT, ALBUMIN in the last 168 hours. No results for input(s): LIPASE, AMYLASE in the last 168 hours. No results for input(s): AMMONIA in the last 168 hours.  CBC: Recent Labs  Lab 07/06/17 1002  WBC 9.1  HGB 9.5*  HCT 28.9*  MCV 94.9  PLT 346    Cardiac Enzymes: No results for input(s): CKTOTAL, CKMB, CKMBINDEX, TROPONINI in the last 168 hours.  BNP: Invalid input(s): POCBNP  CBG: Recent Labs  Lab 07/05/17 2141 07/06/17 0748 07/06/17 1642 07/06/17 2009 07/07/17 0742  GLUCAP 141* 143* 191* 167* 128*    Microbiology: Results for orders placed or performed during the hospital encounter of 06/20/17  MRSA PCR Screening     Status: None   Collection Time: 06/21/17  5:00 AM  Result Value Ref Range Status   MRSA by PCR NEGATIVE NEGATIVE Final    Comment:        The GeneXpert MRSA Assay (FDA approved for NASAL specimens only), is one component of a comprehensive MRSA colonization surveillance program. It is not intended to diagnose MRSA infection nor to guide or monitor treatment for MRSA infections.   Culture, blood (Routine X 2) w Reflex to ID Panel     Status: None   Collection Time: 06/21/17  8:00 AM  Result Value Ref Range Status   Specimen Description BLOOD RIGHT ANTECUBITAL  Final   Special Requests  Final    BOTTLES DRAWN AEROBIC AND ANAEROBIC Blood Culture results may not be optimal due to an excessive volume of blood received in culture bottles   Culture NO GROWTH 5 DAYS  Final   Report Status 06/26/2017 FINAL  Final  Culture, blood (Routine X 2) w Reflex to ID Panel     Status: None   Collection Time: 06/21/17  8:04 AM  Result Value Ref Range Status   Specimen Description BLOOD BLOOD RIGHT FOREARM  Final   Special Requests   Final    BOTTLES DRAWN AEROBIC AND ANAEROBIC Blood Culture results may not be optimal due to an excessive volume of blood received in  culture bottles   Culture NO GROWTH 5 DAYS  Final   Report Status 06/26/2017 FINAL  Final    Coagulation Studies: No results for input(s): LABPROT, INR in the last 72 hours.  Urinalysis: No results for input(s): COLORURINE, LABSPEC, PHURINE, GLUCOSEU, HGBUR, BILIRUBINUR, KETONESUR, PROTEINUR, UROBILINOGEN, NITRITE, LEUKOCYTESUR in the last 72 hours.  Invalid input(s): APPERANCEUR    Imaging: No results found.   Medications:    . allopurinol  100 mg Oral Daily  . amitriptyline  25 mg Oral QHS  . amLODipine  5 mg Oral Daily  . aspirin  81 mg Per Tube Daily  . feeding supplement (NEPRO CARB STEADY)  237 mL Oral BID BM  . furosemide  40 mg Oral BID  . heparin subcutaneous  5,000 Units Subcutaneous Q8H  . insulin aspart  0-15 Units Subcutaneous TID WC  . insulin aspart  0-5 Units Subcutaneous QHS  . insulin detemir  7 Units Subcutaneous BID AC & HS  . mouth rinse  15 mL Mouth Rinse q12n4p  . metoprolol tartrate  25 mg Oral BID  . multivitamin  1 tablet Oral QHS  . multivitamin with minerals  1 tablet Oral Once per day on Mon Thu  . omega-3 acid ethyl esters  1 g Oral Daily  . sodium chloride flush  3 mL Intravenous Q12H   acetaminophen, HYDROcodone-acetaminophen, ipratropium-albuterol, [DISCONTINUED] ondansetron **OR** ondansetron (ZOFRAN) IV  Assessment/ Plan:  Christopher [name] is a 76 y.o. white male with chronic kidney disease stage IV baseline creatinine 2.5-3, coronary artery disease, hypertension, hyperlipidemia, left partial nephrectomy, gout, diabetes mellitus type II insulin dependent, diabetic peripheral neuropathy and osteoarthritis   1. End Stage Renal Disease: seen and examined on hemodialysis. Seated in chair. Tolerating treatment well.  Dialysis first treatment 11/19. RIJ permcath Outpatient planning. [place], [place] Follows with Dr. Bland SpanHadley, [facility].   2. Hypertension:   - amlodipine, furosemide, and metoprolol  3. Anemia of chronic  kidney disease: hemoglobin 9.5 - not currently on ESA therapy.   4. Secondary Hyperparathyroidism: phosphorus and calcium at goal. No PTH level available.  - not currently on binder or vitamin D agent.   5. Diabetes mellitus type II with chronic kidney disease: insulin dependent. Hemogloin A1c 7.3% on admission.  - Continue glucose control.    LOS: 17 Maximilien Hayashi 11/27/201810:29 AM

## 2017-07-07 NOTE — Progress Notes (Signed)
Post HD assessment unchanged  

## 2017-07-07 NOTE — Care Management Important Message (Signed)
Important Message  Patient Details  Name: Christopher [name] MRN: [id number] Date of Birth: March 27, 1941   Medicare Important Message Given:  Yes Signed IM notice given   Eber HongGreene, Ewing Fandino R, RN 07/07/2017, 2:46 PM

## 2017-07-07 NOTE — Clinical Social Work Note (Signed)
CS[facility] CSW called [employer] this morning and they had not even reviewed information at the time of the call. York SpanielMonica Siren Porrata MSW,LCSW [phone]

## 2017-07-07 NOTE — Progress Notes (Signed)
HD initiated via R Chest HD cath without issue. No heparin treatment. Patient in recliner for HD. Currently without complaints. Denies pain.

## 2017-07-07 NOTE — Progress Notes (Signed)
HD completed w/o issue, pt unable to achieve goal due to hypotension.

## 2017-07-07 NOTE — Progress Notes (Signed)
OT Cancellation Note  Patient Details Name: Christopher [name] MRN: [id number] DOB: January 28, 1941   Cancelled Treatment:    Reason Eval/Treat Not Completed: Patient at procedure or test/ unavailable. Upon attempt this morning, pt out of room for dialysis. Will re-attempt in afternoon as pt is available.   Richrd PrimeJamie Stiller, MPH, MS, OTR/L ascom [phone] 07/07/17, 11:09 AM

## 2017-07-07 NOTE — Discharge Summary (Signed)
[facility] -  at [facility]   PATIENT NAME: Christopher GambleRalph [name]    MR#:  [id number]  DATE OF BIRTH:  Jul 15, 1941  DATE OF ADMISSION:  06/20/2017   ADMITTING PHYSICIAN: Arnaldo NatalMichael S Diamond, MD  DATE OF DISCHARGE: 07/07/2017  PRIMARY CARE PHYSICIAN: Cristy Hiltsampbell, Ashley M, NP   ADMISSION DIAGNOSIS:  SOB (shortness of breath) [R06.02] Hypoxia [R09.02] Acute renal failure, unspecified acute renal failure type (HCC) [N17.9] DISCHARGE DIAGNOSIS:  Active Problems:   Acute renal failure superimposed on stage 4 chronic kidney disease (HCC)   Acute respiratory failure (HCC)   Acute renal failure (HCC)  SECONDARY DIAGNOSIS:   Past Medical History:  Diagnosis Date  . Anginal pain (HCC)   . Anxiety   . Arthritis    "all over"  . Chronic back pain greater than 3 months duration   . 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 Coronary artery disease    [facility]  . Hyperlipemia   . Hypertension    stress test done    [facility] in [place]  . Kidney stones   . Myocardial infarction (HCC) 2003  . 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 Pneumonia    "when I was a baby"  . Type II diabetes mellitus (HCC)    "take a pill for it"  . Urination, excessive at night    HOSPITAL COURSE:  76 y.o.caucasian malewith chronic kidney disease stage IV baseline creatinine 2.5-3, coronary artery disease, hypertension, hyperlipidemia, left partial nephrectomy, gout, diabetes mellitus type II insulin dependent, diabetic peripheral neuropathy and osteoarthritis [facility]for SOB (shortness of breath)   *Acute respiratory failure due to Acute on chronic diastolic CHF: required transient vent support on admission  * ESRD - Dialysis first treatment 11/19. RIJ permcath Outpatient planning. [place] Follow[facility] with Dr. Bland [facility]Hadley, [facility].   *Diabetes mellitus type  2 Continue Insulin  *Coronary artery disease: Continue aspirin  *Hypertension - controlled on Norvasc and metoprolol.  * Right knee pain. No DVT on ultrasound. Has right Baker's cyst.  Xray showedarthritis and some effusion.  * Acute inpatient delirium - resolved, monitor  7. weakness. Physical therapy recommended STR/SNF where he is being D/Ced DISCHARGE CONDITIONS:  stable CONSULTS OBTAINED:  Treatment Team:  Erin FullingKasa, Kurian, MD Lamont DowdyKolluru, Sarath, MD Annice Needyew, Jason S, MD DRUG ALLERGIES:   Allergies  Allergen Reactions  . Neurontin [Gabapentin] Other (See Comments)    Swelling when taking everyday   DISCHARGE MEDICATIONS:   Allergies as of 07/07/2017      Reactions   Neurontin [gabapentin] Other (See Comments)   Swelling when taking everyday      Medication List    STOP taking these medications   potassium chloride SA 20 MEQ tablet Commonly known as:  K-DUR,KLOR-CON     TAKE these medications   allopurinol 100 MG tablet Commonly known as:  ZYLOPRIM Take 1 tablet daily by mouth.   amitriptyline 25 MG tablet Commonly known as:  ELAVIL Take 1 tablet (25 mg total) by mouth at bedtime. What changed:    medication strength  how much to take   amLODipine 5 MG tablet Commonly known as:  NORVASC Take 1 tablet (5 mg total) by mouth daily. What changed:    medication strength  how much to take   aspirin EC 325 MG tablet Take 81 mg by mouth daily.   feeding supplement (NEPRO CARB STEADY)  Liqd Take 237 mLs by mouth 2 (two) times daily between meals.   Fish Oil 1200 MG Caps Take 1 capsule by mouth daily.   furosemide 40 MG tablet Commonly known as:  LASIX Take 1 tablet (40 mg total) by mouth 2 (two) times daily. What changed:  when to take this   gabapentin 600 MG tablet Commonly known as:  NEURONTIN Take 600 mg by mouth Three times daily as needed. For nerve pain   insulin NPH Human 100 UNIT/ML injection Commonly known as:  HUMULIN N,NOVOLIN  N Inject 10 Units 2 (two) times daily into the skin.   metoprolol succinate 100 MG 24 hr tablet Commonly known as:  TOPROL-XL Take 25 mg by mouth daily.   multivitamin tablet Take 1 tablet by mouth daily.        DISCHARGE INSTRUCTIONS:   DIET:  Regular diet DISCHARGE CONDITION:  Good ACTIVITY:  Activity as tolerated OXYGEN:  Home Oxygen: No.  Oxygen Delivery: room air DISCHARGE LOCATION:  nursing home - [facility] in [place]    If you experience worsening of your admission symptoms, develop shortness of breath, life threatening emergency, suicidal or homicidal thoughts you must seek medical attention immediately by calling 911 or calling your MD immediately  if symptoms less severe.  You Must read complete instructions/literature along with all the possible adverse reactions/side effects for all the Medicines you take and that have been prescribed to you. Take any new Medicines after you have completely understood and accpet all the possible adverse reactions/side effects.   Please note  You were cared for by a hospitalist during your hospital stay. If you have any questions about your discharge medications or the care you received while you were in the hospital after you are discharged, you can call the unit and asked to speak with the hospitalist on call if the hospitalist that took care of you is not available. Once you are discharged, your primary care physician will handle any further medical issues. Please note that NO REFILLS for any discharge medications will be authorized once you are discharged, as it is imperative that you return to your primary care physician (or establish a relationship with a primary care physician if you do not have one) for your aftercare needs so that they can reassess your need for medications and monitor your lab values.    On the day of Discharge:  VITAL SIGNS:  Blood pressure 106/63, pulse (!) 107, temperature 98.3 F (36.8 C),  temperature source Oral, resp. rate 18, height 5\' 8"  (1.727 m), weight 77.3 kg (170 lb 8 oz), SpO2 100 %. PHYSICAL EXAMINATION:  GENERAL:  76 y.o.-year-old patient lying in the bed with no acute distress.  EYES: Pupils equal, round, reactive to light and accommodation. No scleral icterus. Extraocular muscles intact.  HEENT: Head atraumatic, normocephalic. Oropharynx and nasopharynx clear.  NECK:  Supple, no jugular venous distention. No thyroid enlargement, no tenderness.  LUNGS: Normal breath sounds bilaterally, no wheezing, rales,rhonchi or crepitation. No use of accessory muscles of respiration.  CARDIOVASCULAR: S1, S2 normal. No murmurs, rubs, or gallops.  ABDOMEN: Soft, non-tender, non-distended. Bowel sounds present. No organomegaly or mass.  EXTREMITIES: No pedal edema, cyanosis, or clubbing.  NEUROLOGIC: Cranial nerves II through XII are intact. Muscle strength 5/5 in all extremities. Sensation intact. Gait not checked.  PSYCHIATRIC: The patient is alert and oriented x 3.  SKIN: No obvious rash, lesion, or ulcer.  DATA REVIEW:   CBC Recent Labs  Lab 07/06/17  1002  WBC 9.1  HGB 9.5*  HCT 28.9*  PLT 346    Chemistries  Recent Labs  Lab 07/06/17 1002  NA 139  K 3.9  CL 97*  CO2 28  GLUCOSE 131*  BUN 72*  CREATININE 2.59*  CALCIUM 8.4*     Contact information for follow-up providers    Cristy Hilts, NP. Schedule an appointment as soon as possible for a visit in 1 week(s).   Specialty:  Nurse Practitioner Contact information: [address] [place] [address] [phone]            Contact information for after-discharge care    Destination    [facility] .   Service:  Skilled Nursing Contact information: [address] [place] [address] [phone]                  Management plans discussed with the patient, family and they are in agreement.  CODE STATUS: Full Code   TOTAL TIME  TAKING CARE OF THIS PATIENT: 45 minutes.    Delfino Lovett M.D on 07/07/2017 at 2:55 PM  Between 7am to 6pm - Pager - [phone]  After 6pm go to [email or web address] - password [email or web address]  [facility] [place] [facility]  Office  [phone]  CC: Primary care physician; Cristy Hilts, NP   Note: This dictation was prepared with Dragon dictation along with smaller phrase technology. Any transcriptional errors that result from this process are unintentional.

## 2017-12-09 IMAGING — DX DG ABDOMEN 1V
2 series · 2 of 2 positions shown · non-contrast
Comparison: Lumbar spine radiographs performed 04/17/2017

CLINICAL DATA: Nasogastric tube placement.

EXAM:
ABDOMEN - 1 VIEW

[abdomen kub (1 of 2)]
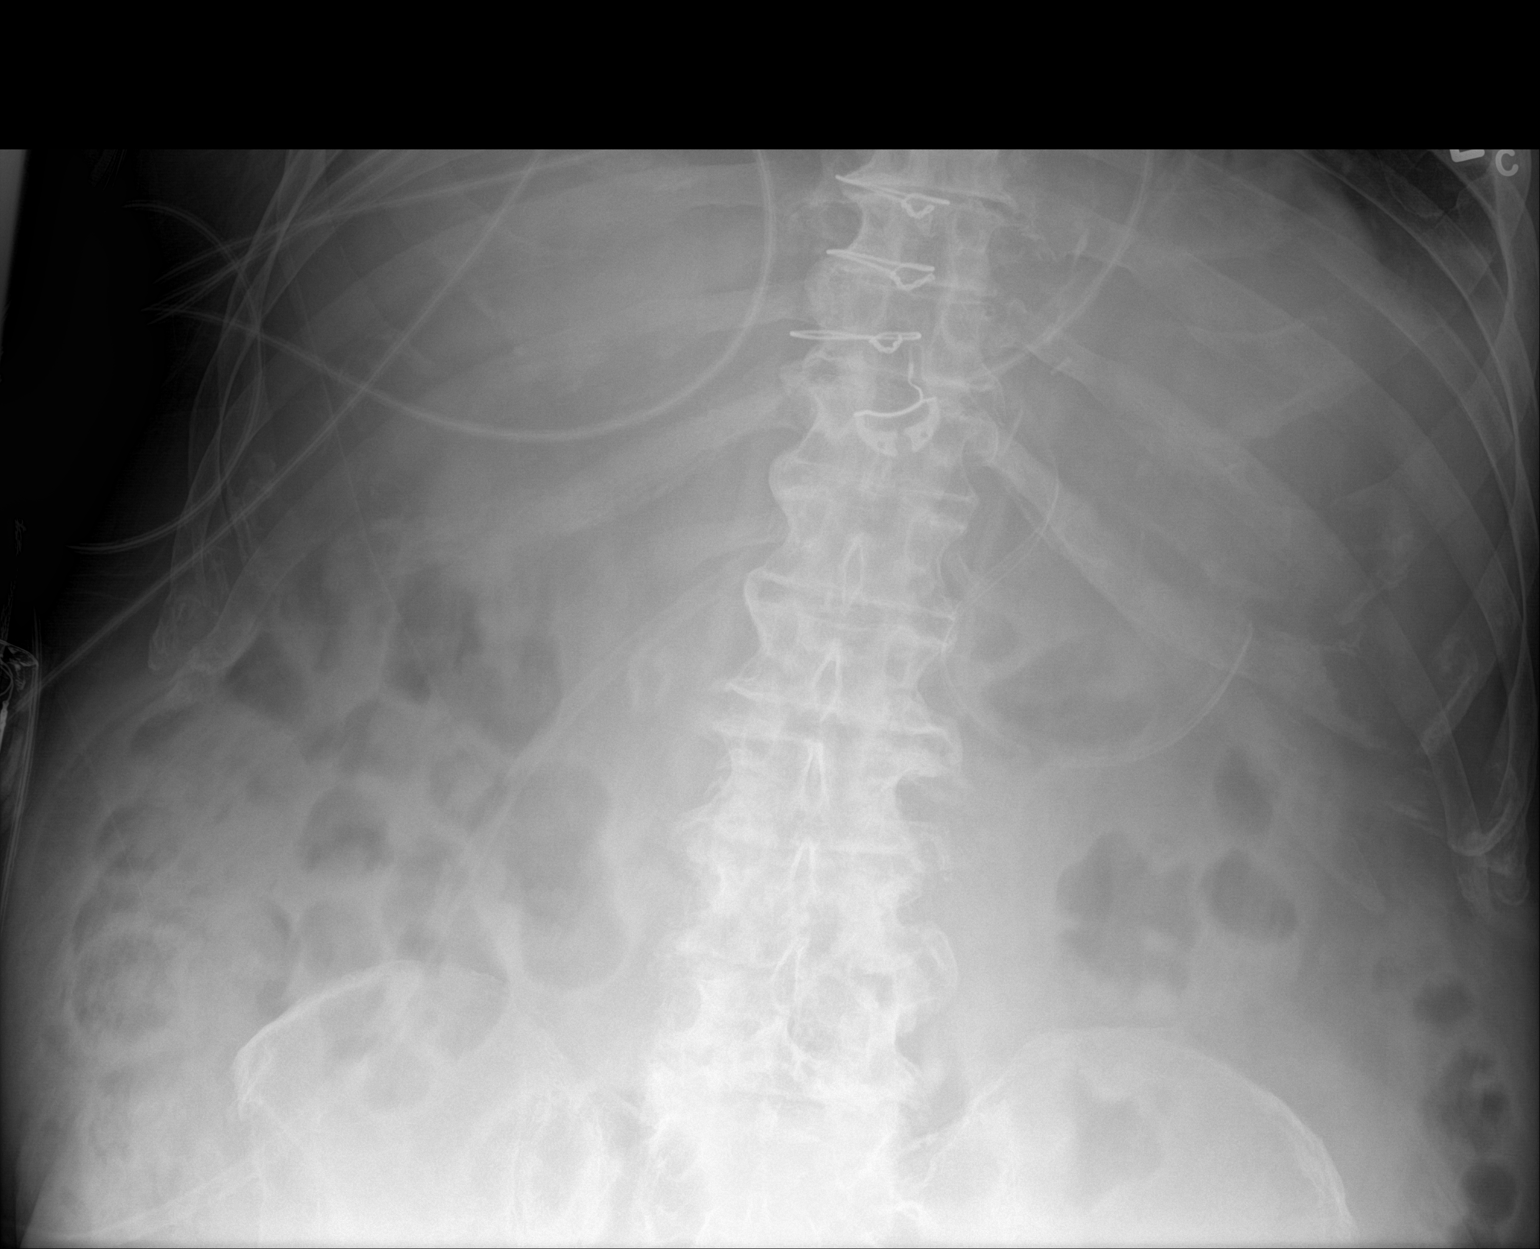

[abdomen kub (2 of 2)]
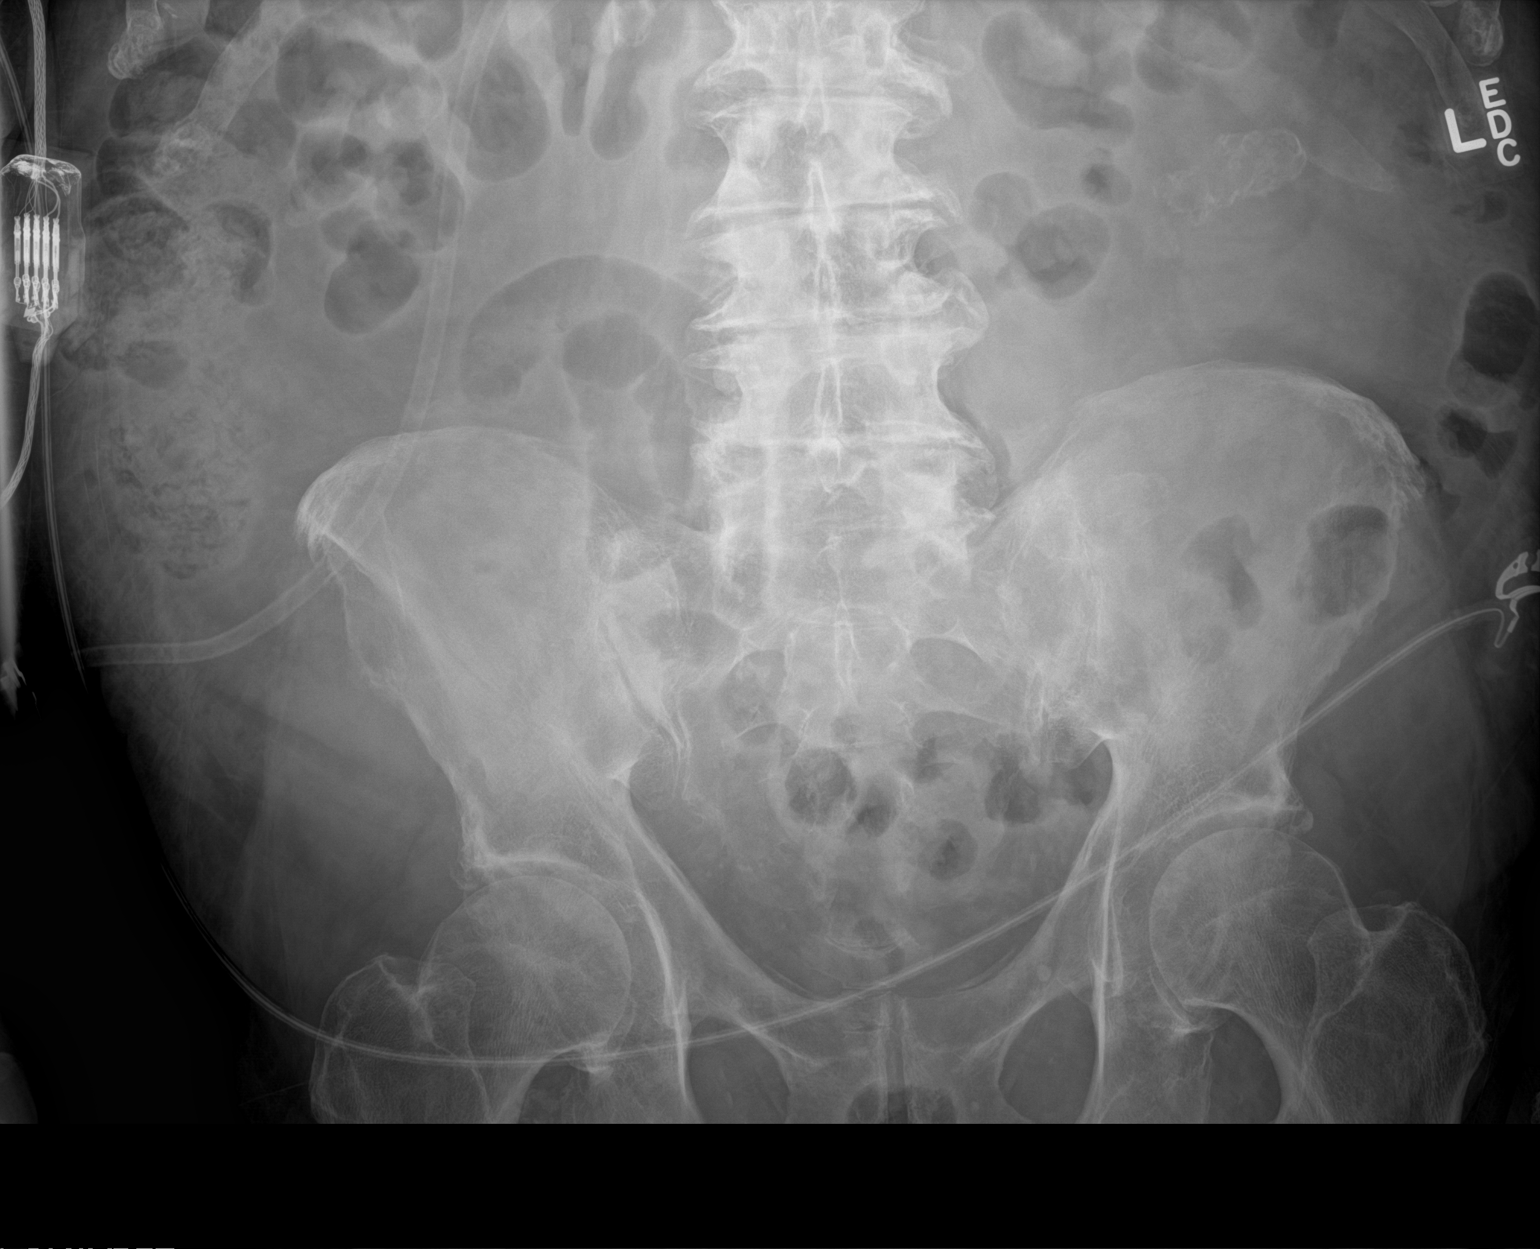

[2 of 2 positions shown; findings below may reference images not displayed]

FINDINGS: The patient's enteric tube is noted ending overlying the body of the
stomach.

The visualized bowel gas pattern is grossly unremarkable. No free
intra-abdominal air is seen, though evaluation for free air is
limited on a single supine view. Mild degenerative change is noted
along the lumbar spine. The patient is status post median
sternotomy.
IMPRESSION: Enteric tube noted ending overlying the body of the stomach.

## 2017-12-15 IMAGING — CR DG KNEE COMPLETE 4+V*R*
1 series · 4 of 4 positions shown · non-contrast
Comparison: None.

CLINICAL DATA: Right knee pain.

EXAM:
RIGHT KNEE - COMPLETE 4+ VIEW

[Series 1: dg knee complete 4 views right · 0.14mm/px · 4 of 4 slices shown]
[im 1/4]
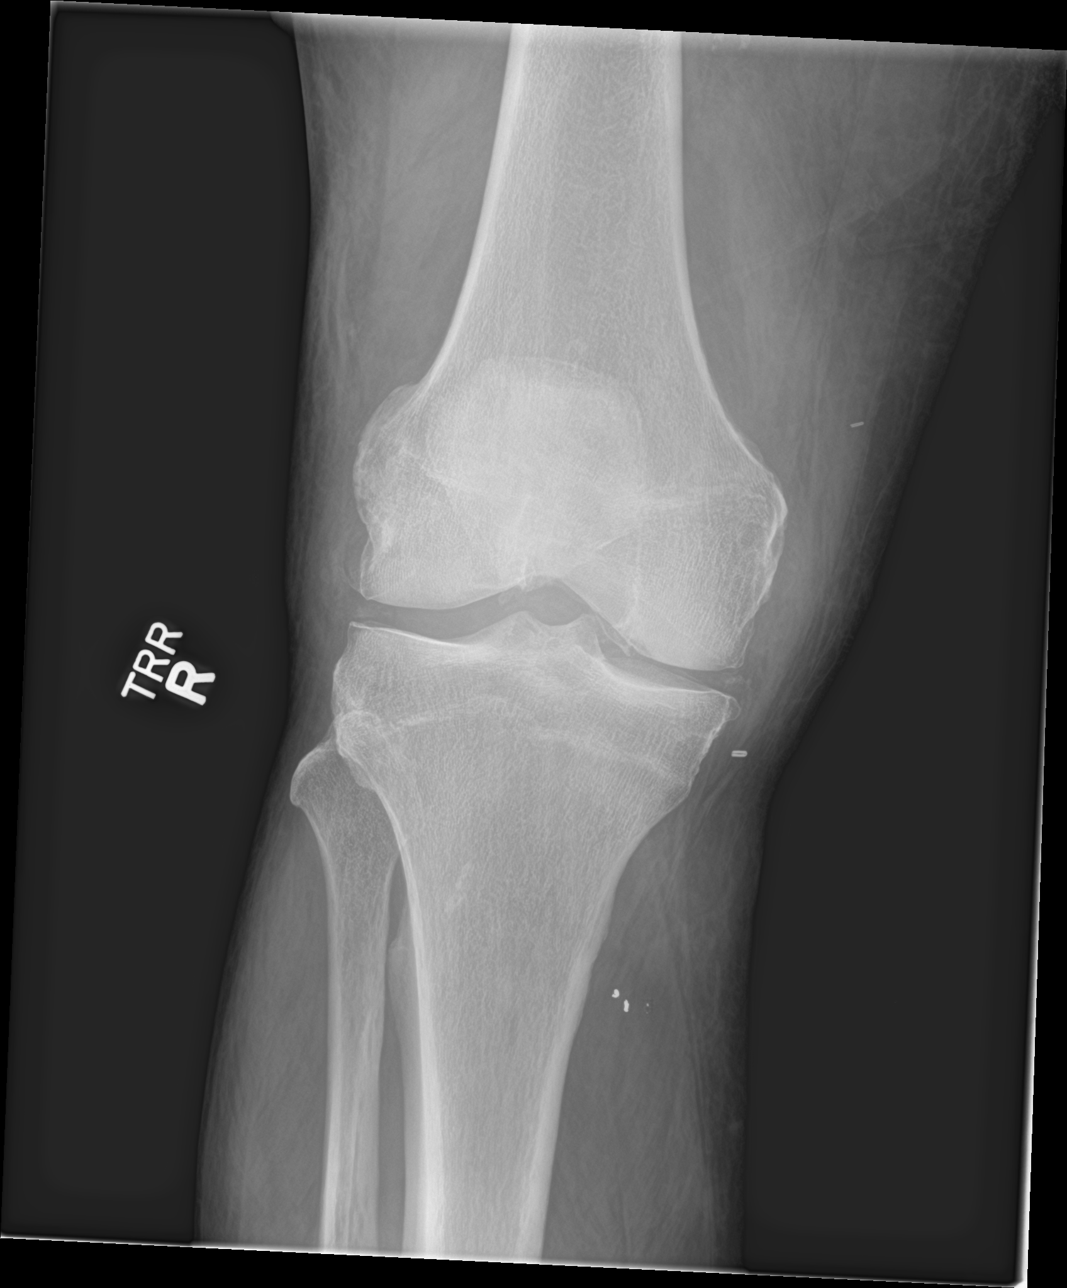
[im 2/4]
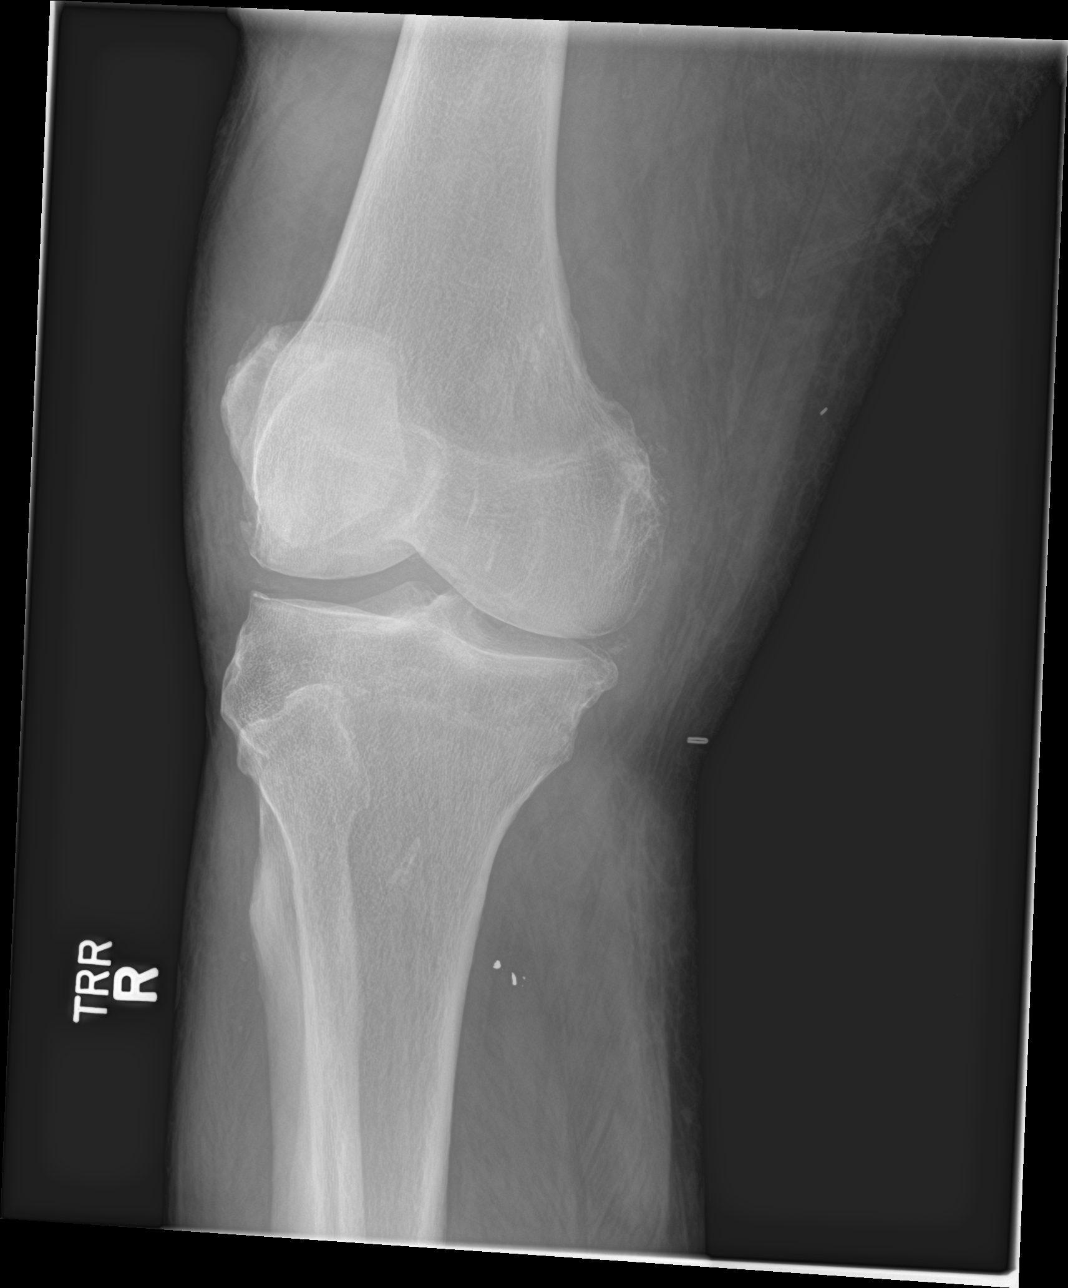
[im 3/4]
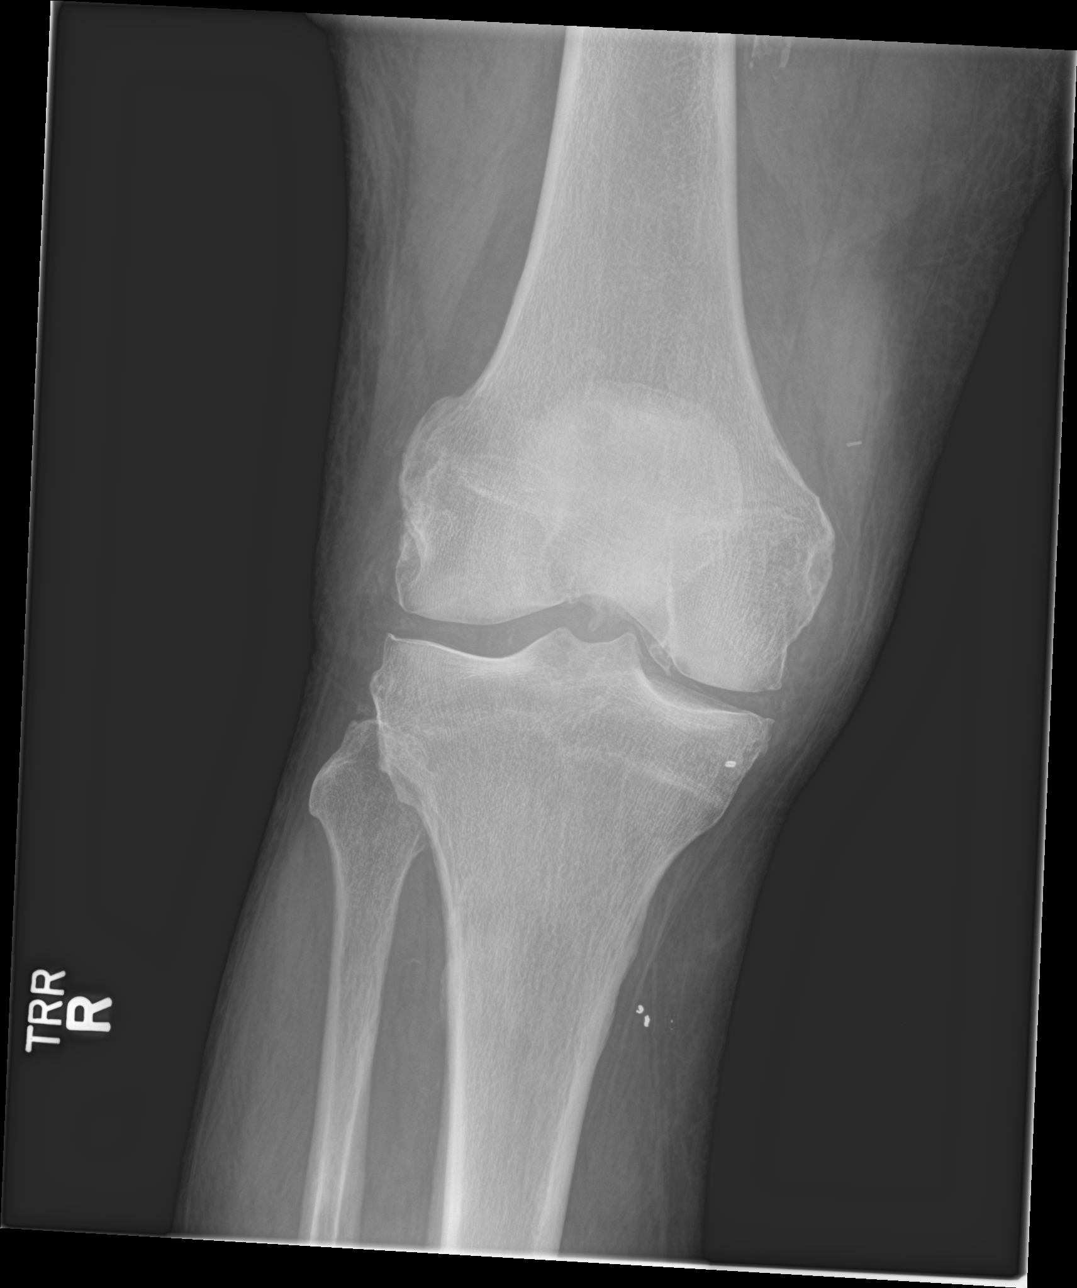
[im 4/4]
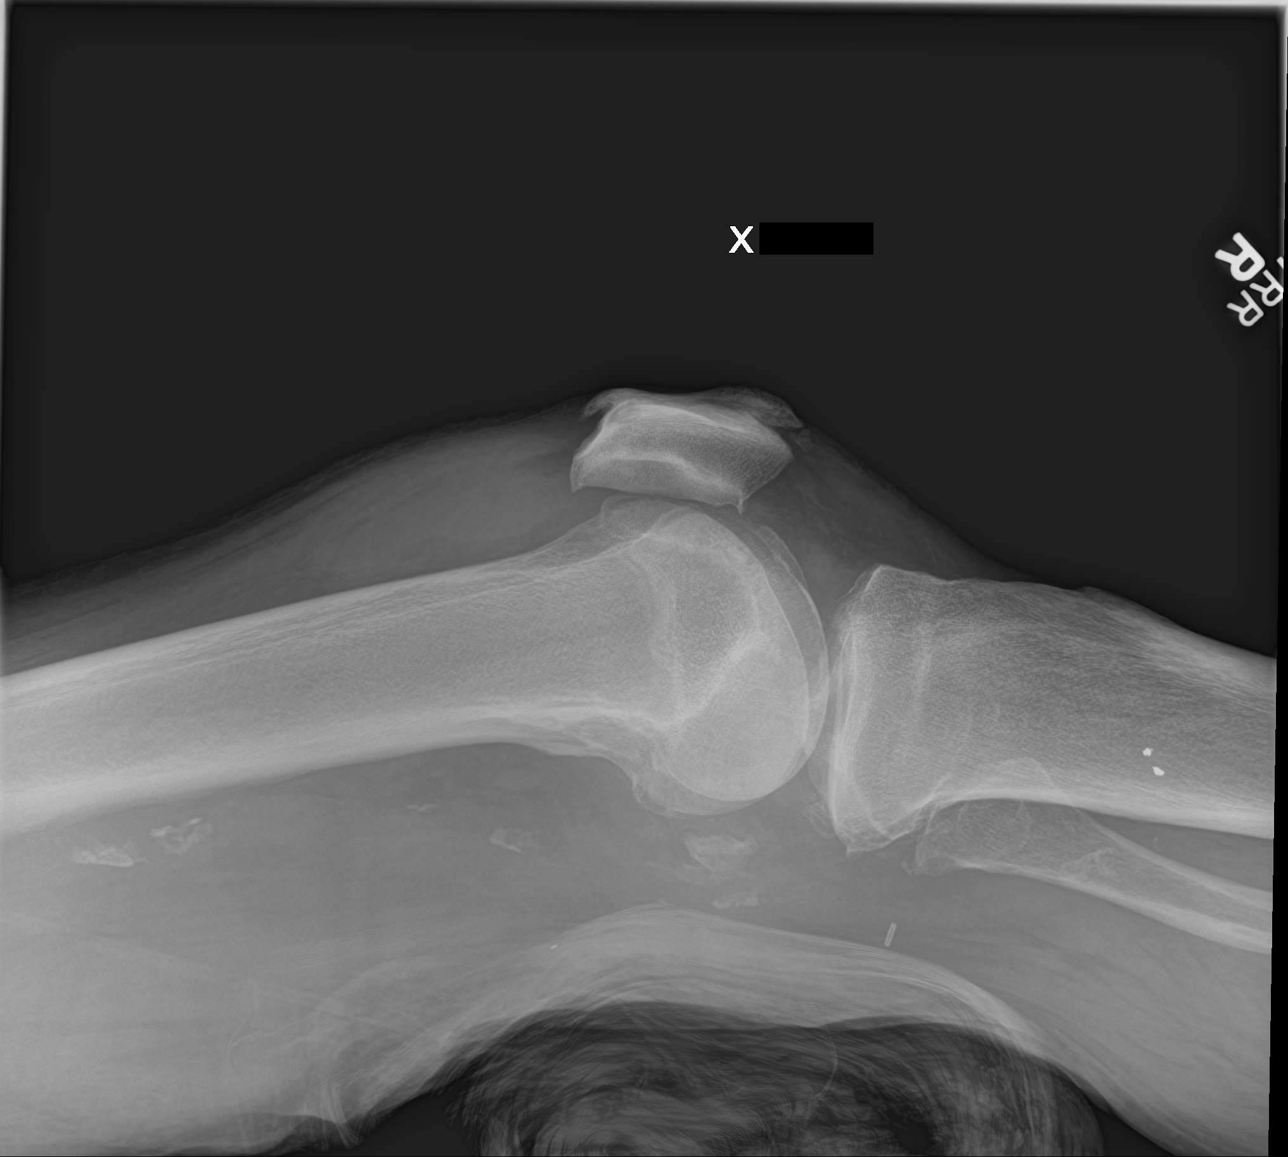

[4 of 4 positions shown; findings below may reference images not displayed]

FINDINGS: Mild chondrocalcinosis. Mild spurring and joint space narrowing.
Moderate joint effusion. No acute bony abnormality. Specifically, no
fracture, subluxation, or dislocation. Soft tissues are intact.
Vascular calcifications noted.
IMPRESSION: Chondrocalcinosis with mild tricompartment degenerative changes.
Moderate joint effusion. No acute bony abnormality.

## 2019-08-06 IMAGING — US US RENAL
1 series · 13 of 25 positions shown · non-contrast
Comparison: None.

CLINICAL DATA: Acute renal failure

EXAM:
RENAL ULTRASOUND

[Series 1: us renal · 0.26mm/px · 13 of 70 slices shown]
[im 1/70]
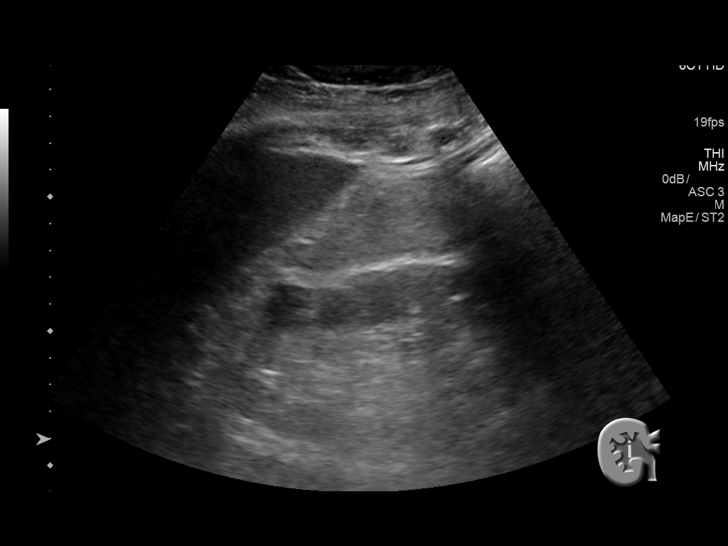
[im 6/70]
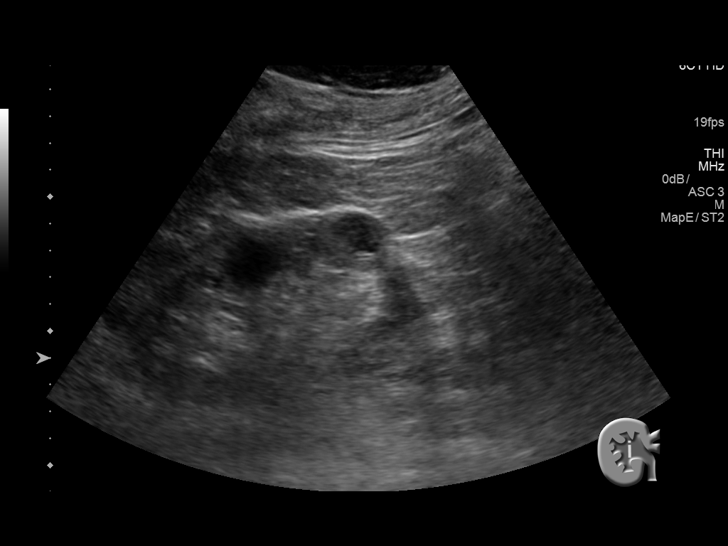
[im 12/70]
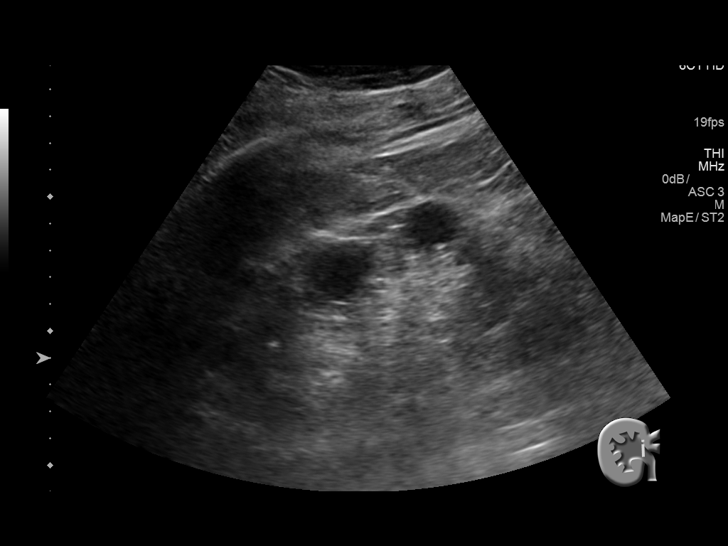
[im 18/70]
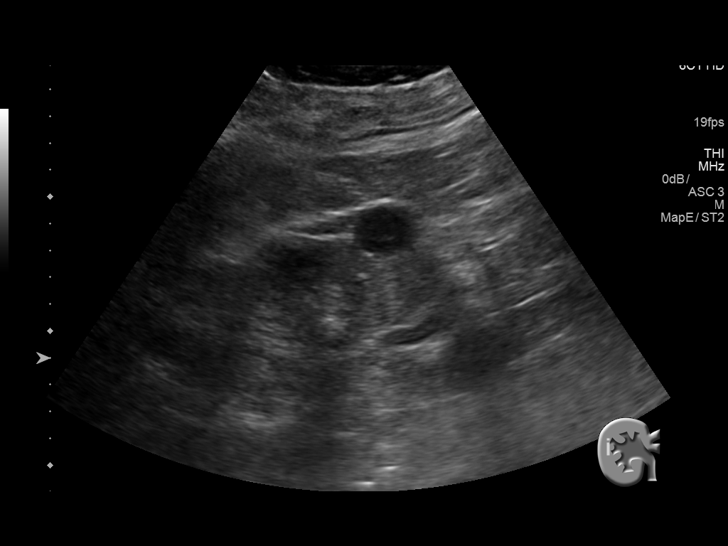
[im 24/70]
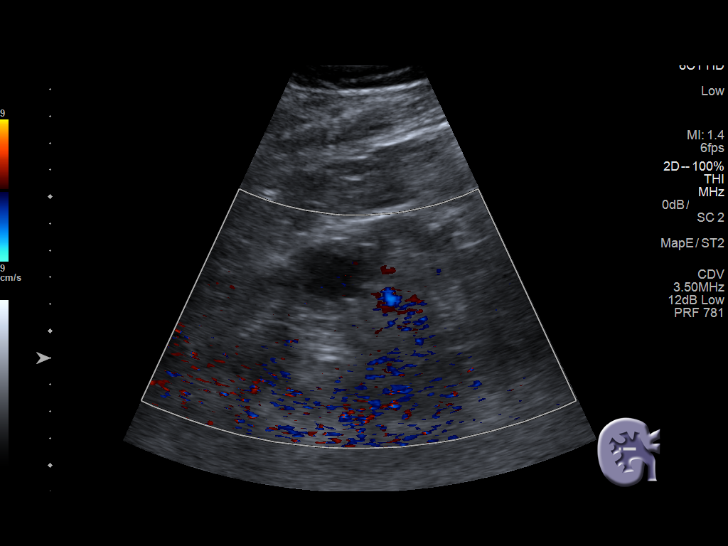
[im 29/70]
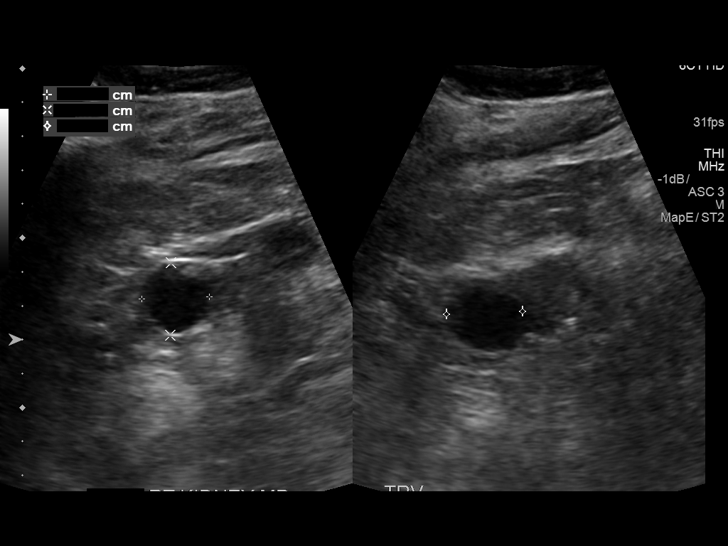
[im 35/70]
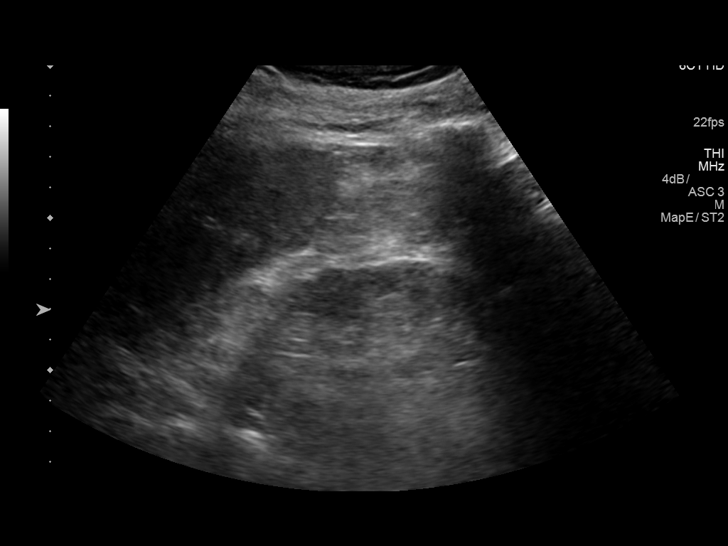
[im 41/70]
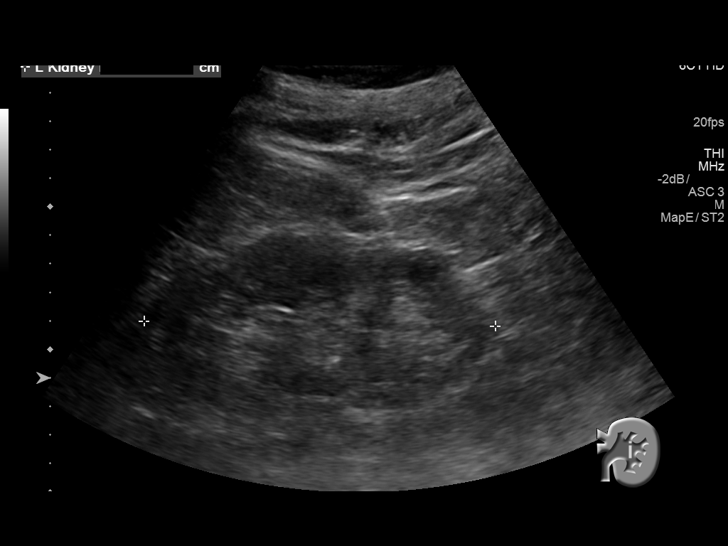
[im 47/70]
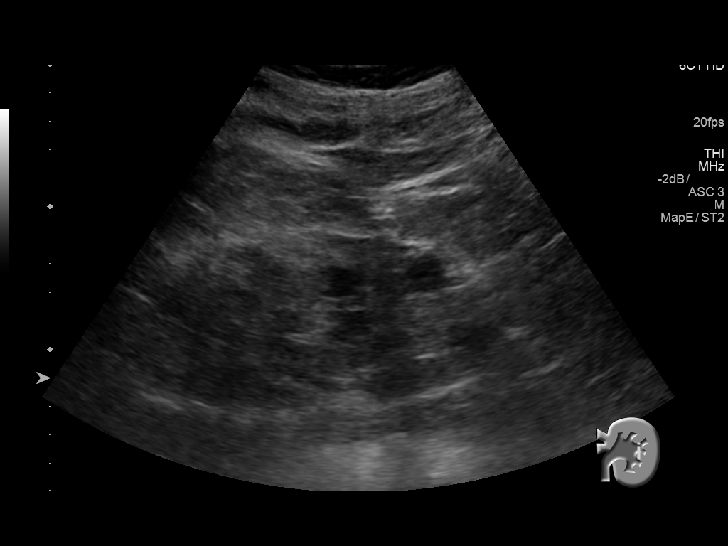
[im 52/70]
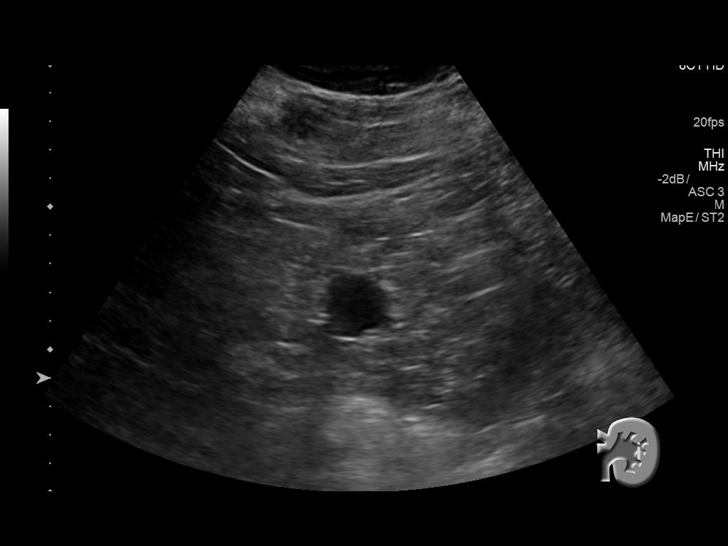
[im 58/70]
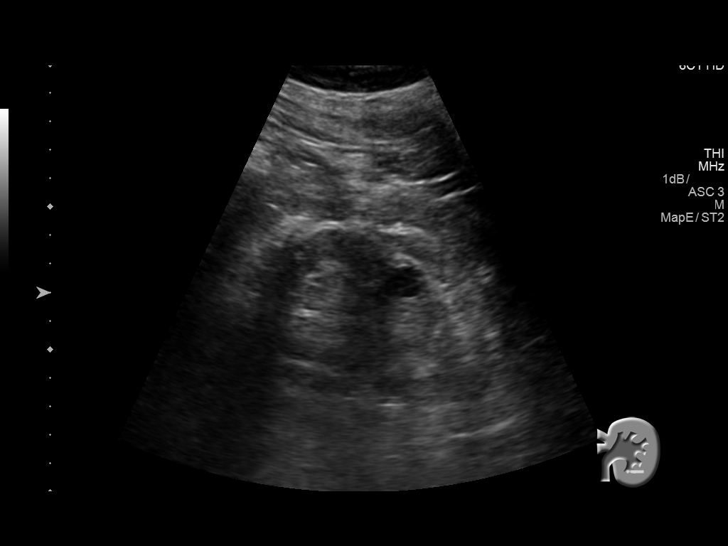
[im 64/70]
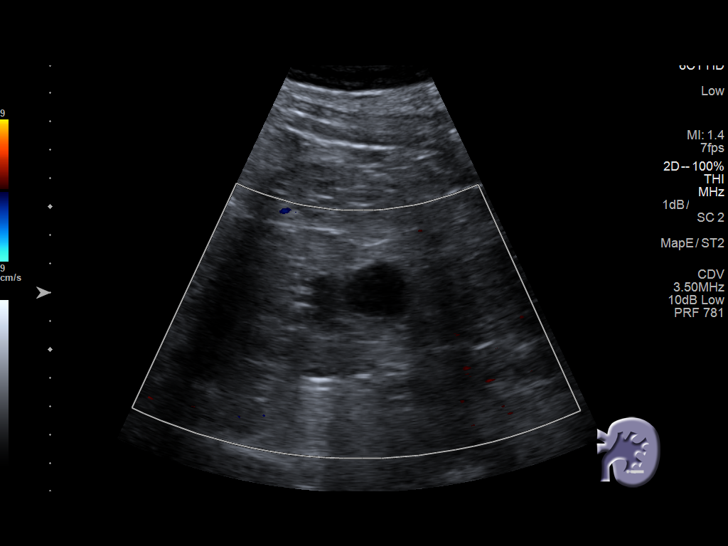
[im 70/70]
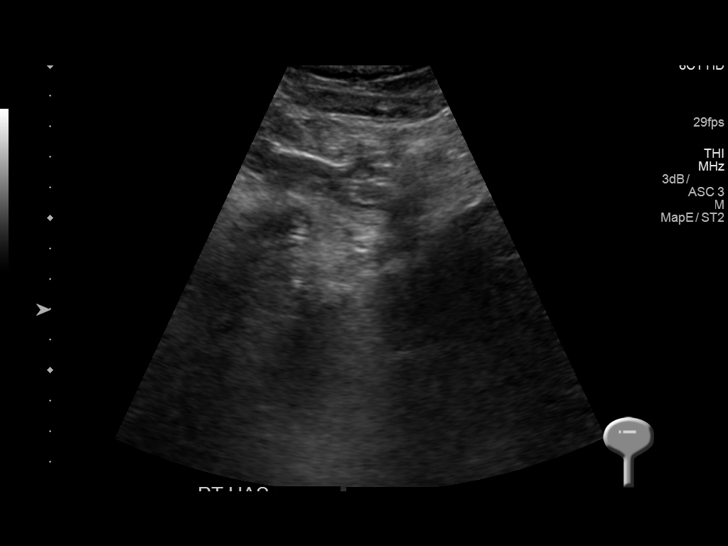

[13 of 25 positions shown; findings below may reference images not displayed]

FINDINGS: Right Kidney:

Length: 12.2 cm. Echogenicity and renal cortical thickness are
within normal limits. No perinephric fluid or hydronephrosis
visualized. There is a cyst in the mid right kidney measuring 2.1 x
2.3 x 2.2 cm. There is a second nearby cyst measuring 1.8 x 1.5 x
1.5 cm. No sonographically demonstrable calculus or ureterectasis.

Left Kidney:

Length: 12.3 cm. Echogenicity and renal cortical thickness are
within normal limits. No perinephric fluid or hydronephrosis
visualized. There is a cyst in the lower pole left kidney measuring
2.2 x 2.5 x 2.4 cm. There is an adjacent lower pole left renal cyst
measuring 2.4 x 1.8 x 1.8 cm. There is a smaller cyst more
superiorly on the left measuring 1.4 x 1.4 x 1.4 cm. No
sonographically demonstrable calculus or ureterectasis.

Bladder:

Foley catheter in place. Urinary bladder empty and cannot be
assessed.
IMPRESSION: There are small renal cysts bilaterally. No noncystic renal masses
evident. No obstructing focus in either kidney. The renal cortical
thickness and echogenicity are normal bilaterally.

Urinary bladder decompressed and cannot be assessed.

## 2023-05-11 ENCOUNTER — Other Ambulatory Visit: Payer: Self-pay | Admitting: Neurosurgery

## 2023-05-11 DIAGNOSIS — M47812 Spondylosis without myelopathy or radiculopathy, cervical region: Secondary | ICD-10-CM

## 2023-05-19 ENCOUNTER — Ambulatory Visit
Admission: RE | Admit: 2023-05-19 | Discharge: 2023-05-19 | Disposition: A | Discharge status: Home / Self Care | Payer: Medicare HMO | Source: Ambulatory Visit | Attending: Neurosurgery | Admitting: Neurosurgery

## 2023-05-19 DIAGNOSIS — M47812 Spondylosis without myelopathy or radiculopathy, cervical region: Secondary | ICD-10-CM
